# Patient Record
Sex: Female | Born: 1985 | Hispanic: No | Marital: Married | Smoking: Former smoker
Health system: Southern US, Community
[De-identification: ages and names within clinical notes are randomized; demographics above are authoritative.]

## PROBLEM LIST (undated history)

## (undated) ENCOUNTER — Inpatient Hospital Stay: Payer: Self-pay

## (undated) DIAGNOSIS — Q6689 Other  specified congenital deformities of feet: Secondary | ICD-10-CM

## (undated) DIAGNOSIS — Z973 Presence of spectacles and contact lenses: Secondary | ICD-10-CM

## (undated) DIAGNOSIS — S0300XA Dislocation of jaw, unspecified side, initial encounter: Secondary | ICD-10-CM

## (undated) DIAGNOSIS — F319 Bipolar disorder, unspecified: Secondary | ICD-10-CM

## (undated) DIAGNOSIS — Z8632 Personal history of gestational diabetes: Secondary | ICD-10-CM

## (undated) DIAGNOSIS — O24419 Gestational diabetes mellitus in pregnancy, unspecified control: Secondary | ICD-10-CM

## (undated) DIAGNOSIS — R519 Headache, unspecified: Secondary | ICD-10-CM

## (undated) DIAGNOSIS — R51 Headache: Secondary | ICD-10-CM

## (undated) DIAGNOSIS — K219 Gastro-esophageal reflux disease without esophagitis: Secondary | ICD-10-CM

## (undated) DIAGNOSIS — F419 Anxiety disorder, unspecified: Secondary | ICD-10-CM

## (undated) DIAGNOSIS — F32A Depression, unspecified: Secondary | ICD-10-CM

## (undated) DIAGNOSIS — J45909 Unspecified asthma, uncomplicated: Secondary | ICD-10-CM

## (undated) DIAGNOSIS — F329 Major depressive disorder, single episode, unspecified: Secondary | ICD-10-CM

## (undated) DIAGNOSIS — G43909 Migraine, unspecified, not intractable, without status migrainosus: Secondary | ICD-10-CM

## (undated) DIAGNOSIS — E669 Obesity, unspecified: Secondary | ICD-10-CM

## (undated) DIAGNOSIS — N390 Urinary tract infection, site not specified: Secondary | ICD-10-CM

## (undated) DIAGNOSIS — E785 Hyperlipidemia, unspecified: Secondary | ICD-10-CM

## (undated) HISTORY — DX: Gestational diabetes mellitus in pregnancy, unspecified control: O24.419

## (undated) HISTORY — DX: Dislocation of jaw, unspecified side, initial encounter: S03.00XA

## (undated) HISTORY — DX: Bipolar disorder, unspecified: F31.9

## (undated) HISTORY — DX: Headache: R51

## (undated) HISTORY — PX: TUBAL LIGATION: SHX77

## (undated) HISTORY — DX: Headache, unspecified: R51.9

## (undated) HISTORY — DX: Anxiety disorder, unspecified: F41.9

## (undated) HISTORY — DX: Migraine, unspecified, not intractable, without status migrainosus: G43.909

## (undated) HISTORY — DX: Hyperlipidemia, unspecified: E78.5

## (undated) HISTORY — PX: WISDOM TOOTH EXTRACTION: SHX21

## (undated) HISTORY — DX: Major depressive disorder, single episode, unspecified: F32.9

## (undated) HISTORY — DX: Depression, unspecified: F32.A

## (undated) HISTORY — DX: Unspecified asthma, uncomplicated: J45.909

## (undated) HISTORY — DX: Obesity, unspecified: E66.9

## (undated) HISTORY — DX: Urinary tract infection, site not specified: N39.0

## (undated) HISTORY — DX: Personal history of gestational diabetes: Z86.32

---

## 2012-08-28 ENCOUNTER — Emergency Department: Payer: Self-pay | Admitting: Emergency Medicine

## 2012-08-28 LAB — BASIC METABOLIC PANEL
Anion Gap: 6 — ABNORMAL LOW (ref 7–16)
BUN: 12 mg/dL (ref 7–18)
Chloride: 106 mmol/L (ref 98–107)
Creatinine: 0.61 mg/dL (ref 0.60–1.30)
Glucose: 85 mg/dL (ref 65–99)
Sodium: 138 mmol/L (ref 136–145)

## 2013-03-27 DIAGNOSIS — Z01419 Encounter for gynecological examination (general) (routine) without abnormal findings: Secondary | ICD-10-CM | POA: Insufficient documentation

## 2014-01-29 ENCOUNTER — Ambulatory Visit: Payer: Self-pay | Admitting: Obstetrics and Gynecology

## 2014-02-14 ENCOUNTER — Ambulatory Visit: Payer: Self-pay | Admitting: Obstetrics and Gynecology

## 2014-03-17 ENCOUNTER — Ambulatory Visit: Payer: Self-pay | Admitting: Obstetrics and Gynecology

## 2014-04-12 ENCOUNTER — Observation Stay: Payer: Self-pay | Admitting: Obstetrics and Gynecology

## 2014-04-16 ENCOUNTER — Ambulatory Visit: Payer: Self-pay | Admitting: Obstetrics and Gynecology

## 2014-04-16 ENCOUNTER — Observation Stay: Payer: Self-pay | Admitting: Obstetrics and Gynecology

## 2014-05-01 ENCOUNTER — Inpatient Hospital Stay: Payer: Self-pay | Admitting: Obstetrics and Gynecology

## 2014-05-01 ENCOUNTER — Encounter: Payer: Self-pay | Admitting: Maternal & Fetal Medicine

## 2014-05-01 LAB — CBC WITH DIFFERENTIAL/PLATELET
BASOS PCT: 0.8 %
Basophil #: 0.1 10*3/uL (ref 0.0–0.1)
Eosinophil #: 0.2 10*3/uL (ref 0.0–0.7)
Eosinophil %: 1.1 %
HCT: 36.8 % (ref 35.0–47.0)
HGB: 11.9 g/dL — AB (ref 12.0–16.0)
LYMPHS PCT: 16.4 %
Lymphocyte #: 2.3 10*3/uL (ref 1.0–3.6)
MCH: 28 pg (ref 26.0–34.0)
MCHC: 32.2 g/dL (ref 32.0–36.0)
MCV: 87 fL (ref 80–100)
MONO ABS: 0.8 x10 3/mm (ref 0.2–0.9)
Monocyte %: 5.9 %
Neutrophil #: 10.6 10*3/uL — ABNORMAL HIGH (ref 1.4–6.5)
Neutrophil %: 75.8 %
Platelet: 300 10*3/uL (ref 150–440)
RBC: 4.24 10*6/uL (ref 3.80–5.20)
RDW: 15.9 % — ABNORMAL HIGH (ref 11.5–14.5)
WBC: 14 10*3/uL — ABNORMAL HIGH (ref 3.6–11.0)

## 2014-05-02 DIAGNOSIS — O2600 Excessive weight gain in pregnancy, unspecified trimester: Secondary | ICD-10-CM

## 2014-05-02 DIAGNOSIS — O24424 Gestational diabetes mellitus in childbirth, insulin controlled: Secondary | ICD-10-CM

## 2014-05-03 LAB — HEMATOCRIT: HCT: 31.3 % — AB (ref 35.0–47.0)

## 2014-06-05 LAB — PATHOLOGY REPORT

## 2014-09-23 ENCOUNTER — Ambulatory Visit: Payer: Self-pay | Admitting: Family Medicine

## 2014-10-17 DIAGNOSIS — Z8632 Personal history of gestational diabetes: Secondary | ICD-10-CM

## 2014-10-17 HISTORY — DX: Personal history of gestational diabetes: Z86.32

## 2015-02-07 NOTE — Op Note (Signed)
PATIENT NAME:  Andrea Roberson, Andrea Roberson MR#:  [id number] DATE OF BIRTH:  02/24/1986  DATE OF PROCEDURE:  05/02/2014  PREOPERATIVE DIAGNOSES: 1.  Intrauterine pregnancy at 7638 weeks' gestation.  2.  Gestational diabetes, insulin dependent.  3.  Suspected large for gestational age infant.  4.  Failed induction of labor secondary to arrest of dilation.   POSTOPERATIVE DIAGNOSES:  1.  Intrauterine pregnancy at 7338 weeks' gestation.  2.  Gestational diabetes, insulin dependent.  3.  Suspected large for gestational age infant. 4.  Failed induction of labor secondary to arrest of dilation.  5.  Status post low transverse cesarean section.   PROCEDURE: Primary low transverse cesarean section.   SURGEON: Hildred LaserAnika Deasiah Hagberg, MD   ANESTHESIA: Spinal combined with epidural.  ESTIMATED BLOOD LOSS: 400 mL.   URINE OUTPUT: 300 mL.  IV FLUIDS: 1000 mL.   FINDINGS: Female infant, cephalic, weight of 7 pounds 8 ounces or 3400 grams. Apgars were 8 and 9. Clear amniotic fluid at ruptured membranes. Normal-appearing placenta with 3-vessel cord. Uterus, tubes and ovaries appeared normal.   DESCRIPTION OF PROCEDURE: The patient was taken to the operating room where she was placed in the supine position with left lateral tilt. She was then prepped and draped in the usual sterile manner. She received 2 grams of Ancef. A Foley catheter was noted to be in place and drained to gravity. Epidural was tested and found to be adequate. The Pfannenstiel incision was made and carried down through the subcutaneous tissue to the fascia. The fascia was incised in the midline, and the incision was extended laterally using Bovie. The fascia was then separated from the underlying rectus tissue superiorly and inferiorly using sharp and blunt dissection. The peritoneum was then identified and entered. Peritoneal incision was extended longitudinally. Bladder blade was inserted.  The uterovesical peritoneal reflection was incised transversely and  the bladder flap was bluntly freed from the lower uterine segment. A low transverse uterine incision was made along the lower uterine segment with a scalpel. The uterine incision was then extended laterally and superiorly bluntly. The bladder blade was removed and the infant's head was delivered atraumatically. No nuchal cord was noted. The nose and mouth were suctioned. The infant was delivered from vertex presentation, a 3400 gram female with Apgars of 8 and 9. Infant was handed off to the pediatricians in attendance. After the umbilical cord was clamped and cut, cord blood was obtained for evaluation. The placenta was removed manually and intact, and appeared normal. The uterus was exteriorized and cleared of all clots and debris. The uterine outline, tubes and ovaries appeared normal. Uterine incision was then closed with running locked sutures of 0 Vicryl. A second running suture of 0 Vicryl was performed in an imbricating fashion. Hemostasis was observed. The paracolic gutters were cleared of all clots and debris using a moist laparotomy sponge. The peritoneum was then approximated using a suture of 3-0 Vicryl in a running fashion. The fascia was then closed in a running fashion using 1-0 Vicryl. Subcutaneous fat layer was closed using a running suture of 2-0 Vicryl. The skin was closed using a subcuticular stitch of 4-0 Vicryl. The incision was covered with Dermabond. A Lidoderm patch was then placed above and below the incision line. The patient tolerated the procedure well. She was taken to the PACU in stable condition. All sponge, lap, and instrument counts were correct x2 at the end of the procedure.  ____________________________ Jacques EarthlyAnika S. Valentino Saxonherry, MD asc:sb D: 05/02/2014 02:07:00 ET  T: 05/02/2014 07:18:55 ET JOB#: [id number]  cc: Jacques Earthly. Valentino Saxon, MD, <Dictator> Fabian November MD ELECTRONICALLY SIGNED 05/12/2014 10:14

## 2015-02-24 NOTE — H&P (Signed)
L&D Evaluation:  History:  HPI 29 y.o. G1P0 at 38.0 weeks who presents for scheduled induction secondary to GDM (insulin dependent) and LGA infant.  C/o contractions, mild-moderate in severity, ongoing for weeks.  Denies vaginal bleeding, LOF, or decreased fetal movement.   Patient's Medical History GDM (insulin dependent), obesity, h/o labile BPs this pregnancy   Patient's Surgical History none   Medications Pre Natal Vitamins  Insulin NPH and Regular   Allergies NKDA   Social History none   Family History Non-Contributory   ROS:  ROS All systems were reviewed.  HEENT, CNS, GI, GU, Respiratory, CV, Renal and Musculoskeletal systems were found to be normal.   Exam:  Vital Signs BP >140/90  140/72   Urine Protein not completed   General no apparent distress   Mental Status clear   Chest clear   Heart normal sinus rhythm, no murmur/gallop/rubs   Abdomen gravid, non-tender   Estimated Fetal Weight Large for gestational age, ~ 8.5 oz (was in 92% 2 weeks  ago on growth scan, decreased from 98% 5 weeks ago)   Fetal Position cephalic   Fundal Height 40   Back no CVAT   Edema 1+  Nonpitting  ankle   Pelvic no external lesions, 3/75/c/-2   Mebranes Intact   FHT normal rate with no decels   Fetal Heart Rate 130   Ucx irregular   Ucx Frequency 8 min   Length of each Contraction 30 seconds   Skin dry   Other O+/-/HIV-/ND/NR/RI/VZI/GC-/Cl-GBS-.  H/H/platelets 11.9/36.8/300   Impression:  Impression early labor, GDM with LGA infant   Plan:  Plan monitor contractions and for cervical change, monitor BP, fluids   Comments Admit to Labor and Delivery. NPO except ice.  Accuchecks q 2 hrs.  If blood sugars less than 60, will administer D5LR.  If above 200, will initiate diabetic protocol of insulin.  If subsequent BPs elevated, [facility].  Pitocin for augmentation of labor.   Electronic Signatures: Fabian Novemberherry, Joniah Bednarski S (MD)  (Signed 16-Jul-15  14:04)  Authored: L&D Evaluation   Last Updated: 16-Jul-15 14:04 by Fabian Novemberherry, Farrell Broerman S (MD)

## 2015-12-08 ENCOUNTER — Telehealth: Payer: Self-pay | Admitting: Family Medicine

## 2015-12-08 DIAGNOSIS — F419 Anxiety disorder, unspecified: Secondary | ICD-10-CM | POA: Insufficient documentation

## 2015-12-08 DIAGNOSIS — F3181 Bipolar II disorder: Secondary | ICD-10-CM

## 2015-12-08 DIAGNOSIS — F329 Major depressive disorder, single episode, unspecified: Secondary | ICD-10-CM | POA: Insufficient documentation

## 2015-12-08 NOTE — Telephone Encounter (Signed)
Dr.Johnson,  This patient was last seen 01/13/15. She has a history of bipolar disorder. Can this referral be put in without patient being seen?

## 2015-12-08 NOTE — Telephone Encounter (Signed)
Christan: Please get patient scheduled for a CPE, last one was 11/20/14, thanks

## 2015-12-08 NOTE — Telephone Encounter (Signed)
That's fine. Order put in. But she is due for her physical, so if we call her, can we get that scheduled? It was 11/20/14

## 2015-12-08 NOTE — Telephone Encounter (Signed)
Pt would like to get a referral to psychiatry. (pt is already established with [facility] but its been about 2 years since last visit)

## 2015-12-09 ENCOUNTER — Telehealth: Payer: Self-pay

## 2015-12-09 NOTE — Telephone Encounter (Signed)
Nedra Hai from [facility] called stated they need more information on this patient. They need office (2 most recent) stating why this pt needs to be seen by them. Thanks.

## 2015-12-15 NOTE — Telephone Encounter (Signed)
Office Notes, ICD10, and Demographics faxed over to [facility].

## 2015-12-22 ENCOUNTER — Ambulatory Visit (INDEPENDENT_AMBULATORY_CARE_PROVIDER_SITE_OTHER): Payer: 59 | Admitting: Obstetrics and Gynecology

## 2015-12-22 ENCOUNTER — Encounter: Payer: Self-pay | Admitting: Obstetrics and Gynecology

## 2015-12-22 VITALS — BP 98/67 | HR 77 | Ht 65.0 in | Wt 213.6 lb

## 2015-12-22 DIAGNOSIS — E669 Obesity, unspecified: Secondary | ICD-10-CM | POA: Diagnosis not present

## 2015-12-22 DIAGNOSIS — Z01419 Encounter for gynecological examination (general) (routine) without abnormal findings: Secondary | ICD-10-CM

## 2015-12-22 DIAGNOSIS — Z30011 Encounter for initial prescription of contraceptive pills: Secondary | ICD-10-CM

## 2015-12-22 MED ORDER — NORETHINDRONE 0.35 MG PO TABS: 1.0000 | ORAL_TABLET | Freq: Every day | ORAL | Status: DC

## 2015-12-22 NOTE — Patient Instructions (Signed)
Health Maintenance, Female Adopting a healthy lifestyle and getting preventive care can go a long way to promote health and wellness. Talk with your health care provider about what schedule of regular examinations is right for you. This is a good chance for you to check in with your provider about disease prevention and staying healthy. In between checkups, there are plenty of things you can do on your own. Experts have done a lot of research about which lifestyle changes and preventive measures are most likely to keep you healthy. Ask your health care provider for more information. WEIGHT AND DIET  Eat a healthy diet  Be sure to include plenty of vegetables, fruits, low-fat dairy products, and lean protein.  Do not eat a lot of foods high in solid fats, added sugars, or salt.  Get regular exercise. This is one of the most important things you can do for your health.  Most adults should exercise for at least 150 minutes each week. The exercise should increase your heart rate and make you sweat (moderate-intensity exercise).  Most adults should also do strengthening exercises at least twice a week. This is in addition to the moderate-intensity exercise.  Maintain a healthy weight  Body mass index (BMI) is a measurement that can be used to identify possible weight problems. It estimates body fat based on height and weight. Your health care provider can help determine your BMI and help you achieve or maintain a healthy weight.  For females 20 years of age and older:   A BMI below 18.5 is considered underweight.  A BMI of 18.5 to 24.9 is normal.  A BMI of 25 to 29.9 is considered overweight.  A BMI of 30 and above is considered obese.  Watch levels of cholesterol and blood lipids  You should start having your blood tested for lipids and cholesterol at 30 years of age, then have this test every 5 years.  You may need to have your cholesterol levels checked more often if:  Your lipid  or cholesterol levels are high.  You are older than 30 years of age.  You are at high risk for heart disease.  CANCER SCREENING   Lung Cancer  Lung cancer screening is recommended for adults 55-80 years old who are at high risk for lung cancer because of a history of smoking.  A yearly low-dose CT scan of the lungs is recommended for people who:  Currently smoke.  Have quit within the past 15 years.  Have at least a 30-pack-year history of smoking. A pack year is smoking an average of one pack of cigarettes a day for 1 year.  Yearly screening should continue until it has been 15 years since you quit.  Yearly screening should stop if you develop a health problem that would prevent you from having lung cancer treatment.  Breast Cancer  Practice breast self-awareness. This means understanding how your breasts normally appear and feel.  It also means doing regular breast self-exams. Let your health care provider know about any changes, no matter how small.  If you are in your 20s or 30s, you should have a clinical breast exam (CBE) by a health care provider every 1-3 years as part of a regular health exam.  If you are 40 or older, have a CBE every year. Also consider having a breast X-ray (mammogram) every year.  If you have a family history of breast cancer, talk to your health care provider about genetic screening.  If you   are at high risk for breast cancer, talk to your health care provider about having an MRI and a mammogram every year.  Breast cancer gene (BRCA) assessment is recommended for women who have family members with BRCA-related cancers. BRCA-related cancers include:  Breast.  Ovarian.  Tubal.  Peritoneal cancers.  Results of the assessment will determine the need for genetic counseling and BRCA1 and BRCA2 testing. Cervical Cancer Your health care provider may recommend that you be screened regularly for cancer of the pelvic organs (ovaries, uterus, and  vagina). This screening involves a pelvic examination, including checking for microscopic changes to the surface of your cervix (Pap test). You may be encouraged to have this screening done every 3 years, beginning at age 21.  For women ages 30-65, health care providers may recommend pelvic exams and Pap testing every 3 years, or they may recommend the Pap and pelvic exam, combined with testing for human papilloma virus (HPV), every 5 years. Some types of HPV increase your risk of cervical cancer. Testing for HPV may also be done on women of any age with unclear Pap test results.  Other health care providers may not recommend any screening for nonpregnant women who are considered low risk for pelvic cancer and who do not have symptoms. Ask your health care provider if a screening pelvic exam is right for you.  If you have had past treatment for cervical cancer or a condition that could lead to cancer, you need Pap tests and screening for cancer for at least 20 years after your treatment. If Pap tests have been discontinued, your risk factors (such as having a new sexual partner) need to be reassessed to determine if screening should resume. Some women have medical problems that increase the chance of getting cervical cancer. In these cases, your health care provider may recommend more frequent screening and Pap tests. Colorectal Cancer  This type of cancer can be detected and often prevented.  Routine colorectal cancer screening usually begins at 30 years of age and continues through 30 years of age.  Your health care provider may recommend screening at an earlier age if you have risk factors for colon cancer.  Your health care provider may also recommend using home test kits to check for hidden blood in the stool.  A small camera at the end of a tube can be used to examine your colon directly (sigmoidoscopy or colonoscopy). This is done to check for the earliest forms of colorectal  cancer.  Routine screening usually begins at age 50.  Direct examination of the colon should be repeated every 5-10 years through 30 years of age. However, you may need to be screened more often if early forms of precancerous polyps or small growths are found. Skin Cancer  Check your skin from head to toe regularly.  Tell your health care provider about any new moles or changes in moles, especially if there is a change in a mole's shape or color.  Also tell your health care provider if you have a mole that is larger than the size of a pencil eraser.  Always use sunscreen. Apply sunscreen liberally and repeatedly throughout the day.  Protect yourself by wearing long sleeves, pants, a wide-brimmed hat, and sunglasses whenever you are outside. HEART DISEASE, DIABETES, AND HIGH BLOOD PRESSURE   High blood pressure causes heart disease and increases the risk of stroke. High blood pressure is more likely to develop in:  People who have blood pressure in the high end   of the normal range (130-139/85-89 mm Hg).  People who are overweight or obese.  People who are African American.  If you are 38-23 years of age, have your blood pressure checked every 3-5 years. If you are 61 years of age or older, have your blood pressure checked every year. You should have your blood pressure measured twice--once when you are at a hospital or clinic, and once when you are not at a hospital or clinic. Record the average of the two measurements. To check your blood pressure when you are not at a hospital or clinic, you can use:  An automated blood pressure machine at a pharmacy.  A home blood pressure monitor.  If you are between 45 years and 39 years old, ask your health care provider if you should take aspirin to prevent strokes.  Have regular diabetes screenings. This involves taking a blood sample to check your fasting blood sugar level.  If you are at a normal weight and have a low risk for diabetes,  have this test once every three years after 30 years of age.  If you are overweight and have a high risk for diabetes, consider being tested at a younger age or more often. PREVENTING INFECTION  Hepatitis B  If you have a higher risk for hepatitis B, you should be screened for this virus. You are considered at high risk for hepatitis B if:  You were born in a country where hepatitis B is common. Ask your health care provider which countries are considered high risk.  Your parents were born in a high-risk country, and you have not been immunized against hepatitis B (hepatitis B vaccine).  You have HIV or AIDS.  You use needles to inject street drugs.  You live with someone who has hepatitis B.  You have had sex with someone who has hepatitis B.  You get hemodialysis treatment.  You take certain medicines for conditions, including cancer, organ transplantation, and autoimmune conditions. Hepatitis C  Blood testing is recommended for:  Everyone born from [birth year] through 1965.  Anyone with known risk factors for hepatitis C. Sexually transmitted infections (STIs)  You should be screened for sexually transmitted infections (STIs) including gonorrhea and chlamydia if:  You are sexually active and are younger than 30 years of age.  You are older than 30 years of age and your health care provider tells you that you are at risk for this type of infection.  Your sexual activity has changed since you were last screened and you are at an increased risk for chlamydia or gonorrhea. Ask your health care provider if you are at risk.  If you do not have HIV, but are at risk, it may be recommended that you take a prescription medicine daily to prevent HIV infection. This is called pre-exposure prophylaxis (PrEP). You are considered at risk if:  You are sexually active and do not regularly use condoms or know the HIV status of your partner(s).  You take drugs by injection.  You are sexually  active with a partner who has HIV. Talk with your health care provider about whether you are at high risk of being infected with HIV. If you choose to begin PrEP, you should first be tested for HIV. You should then be tested every 3 months for as long as you are taking PrEP.  PREGNANCY   If you are premenopausal and you may become pregnant, ask your health care provider about preconception counseling.  If you may  become pregnant, take 400 to 800 micrograms (mcg) of folic acid every day.  If you want to prevent pregnancy, talk to your health care provider about birth control (contraception). OSTEOPOROSIS AND MENOPAUSE   Osteoporosis is a disease in which the bones lose minerals and strength with aging. This can result in serious bone fractures. Your risk for osteoporosis can be identified using a bone density scan.  If you are 82 years of age or older, or if you are at risk for osteoporosis and fractures, ask your health care provider if you should be screened.  Ask your health care provider whether you should take a calcium or vitamin D supplement to lower your risk for osteoporosis.  Menopause may have certain physical symptoms and risks.  Hormone replacement therapy may reduce some of these symptoms and risks. Talk to your health care provider about whether hormone replacement therapy is right for you.  HOME CARE INSTRUCTIONS   Schedule regular health, dental, and eye exams.  Stay current with your immunizations.   Do not use any tobacco products including cigarettes, chewing tobacco, or electronic cigarettes.  If you are pregnant, do not drink alcohol.  If you are breastfeeding, limit how much and how often you drink alcohol.  Limit alcohol intake to no more than 1 drink per day for nonpregnant women. One drink equals 12 ounces of beer, 5 ounces of wine, or 1 ounces of hard liquor.  Do not use street drugs.  Do not share needles.  Ask your health care provider for help if  you need support or information about quitting drugs.  Tell your health care provider if you often feel depressed.  Tell your health care provider if you have ever been abused or do not feel safe at home.   This information is not intended to replace advice given to you by your health care provider. Make sure you discuss any questions you have with your health care provider.   Document Released: 04/18/2011 Document Revised: 10/24/2014 Document Reviewed: 09/04/2013 Elsevier Interactive Patient Education [employer].       Contraception Choices Contraception (birth control) is the use of any methods or devices to prevent pregnancy. Below are some methods to help avoid pregnancy. HORMONAL METHODS   Contraceptive implant. This is a thin, plastic tube containing progesterone hormone. It does not contain estrogen hormone. Your health care provider inserts the tube in the inner part of the upper arm. The tube can remain in place for up to 3 years. After 3 years, the implant must be removed. The implant prevents the ovaries from releasing an egg (ovulation), thickens the cervical mucus to prevent sperm from entering the uterus, and thins the lining of the inside of the uterus.  Progesterone-only injections. These injections are given every 3 months by your health care provider to prevent pregnancy. This synthetic progesterone hormone stops the ovaries from releasing eggs. It also thickens cervical mucus and changes the uterine lining. This makes it harder for sperm to survive in the uterus.  Birth control pills. These pills contain estrogen and progesterone hormone. They work by preventing the ovaries from releasing eggs (ovulation). They also cause the cervical mucus to thicken, preventing the sperm from entering the uterus. Birth control pills are prescribed by a health care provider.Birth control pills can also be used to treat heavy periods.  Minipill. This type of birth control pill  contains only the progesterone hormone. They are taken every day of each month and must be prescribed  by your health care provider.  Birth control patch. The patch contains hormones similar to those in birth control pills. It must be changed once a week and is prescribed by a health care provider.  Vaginal ring. The ring contains hormones similar to those in birth control pills. It is left in the vagina for 3 weeks, removed for 1 week, and then a new one is put back in place. The patient must be comfortable inserting and removing the ring from the vagina.A health care provider's prescription is necessary.  Emergency contraception. Emergency contraceptives prevent pregnancy after unprotected sexual intercourse. This pill can be taken right after sex or up to 5 days after unprotected sex. It is most effective the sooner you take the pills after having sexual intercourse. Most emergency contraceptive pills are available without a prescription. Check with your pharmacist. Do not use emergency contraception as your only form of birth control. BARRIER METHODS   Female condom. This is a thin sheath (latex or rubber) that is worn over the penis during sexual intercourse. It can be used with spermicide to increase effectiveness.  Female condom. This is a soft, loose-fitting sheath that is put into the vagina before sexual intercourse.  Diaphragm. This is a soft, latex, dome-shaped barrier that must be fitted by a health care provider. It is inserted into the vagina, along with a spermicidal jelly. It is inserted before intercourse. The diaphragm should be left in the vagina for 6 to 8 hours after intercourse.  Cervical cap. This is a round, soft, latex or plastic cup that fits over the cervix and must be fitted by a health care provider. The cap can be left in place for up to 48 hours after intercourse.  Sponge. This is a soft, circular piece of polyurethane foam. The sponge has spermicide in it. It is  inserted into the vagina after wetting it and before sexual intercourse.  Spermicides. These are chemicals that kill or block sperm from entering the cervix and uterus. They come in the form of creams, jellies, suppositories, foam, or tablets. They do not require a prescription. They are inserted into the vagina with an applicator before having sexual intercourse. The process must be repeated every time you have sexual intercourse. INTRAUTERINE CONTRACEPTION  Intrauterine device (IUD). This is a T-shaped device that is put in a woman's uterus during a menstrual period to prevent pregnancy. There are 2 types:  Copper IUD. This type of IUD is wrapped in copper wire and is placed inside the uterus. Copper makes the uterus and fallopian tubes produce a fluid that kills sperm. It can stay in place for 10 years.  Hormone IUD. This type of IUD contains the hormone progestin (synthetic progesterone). The hormone thickens the cervical mucus and prevents sperm from entering the uterus, and it also thins the uterine lining to prevent implantation of a fertilized egg. The hormone can weaken or kill the sperm that get into the uterus. It can stay in place for 3-5 years, depending on which type of IUD is used. PERMANENT METHODS OF CONTRACEPTION  Female tubal ligation. This is when the woman's fallopian tubes are surgically sealed, tied, or blocked to prevent the egg from traveling to the uterus.  Hysteroscopic sterilization. This involves placing a small coil or insert into each fallopian tube. Your doctor uses a technique called hysteroscopy to do the procedure. The device causes scar tissue to form. This results in permanent blockage of the fallopian tubes, so the sperm cannot fertilize the  egg. It takes about 3 months after the procedure for the tubes to become blocked. You must use another form of birth control for these 3 months.  Female sterilization. This is when the female has the tubes that carry sperm tied  off (vasectomy).This blocks sperm from entering the vagina during sexual intercourse. After the procedure, the man can still ejaculate fluid (semen). NATURAL PLANNING METHODS  Natural family planning. This is not having sexual intercourse or using a barrier method (condom, diaphragm, cervical cap) on days the woman could become pregnant.  Calendar method. This is keeping track of the length of each menstrual cycle and identifying when you are fertile.  Ovulation method. This is avoiding sexual intercourse during ovulation.  Symptothermal method. This is avoiding sexual intercourse during ovulation, using a thermometer and ovulation symptoms.  Post-ovulation method. This is timing sexual intercourse after you have ovulated. Regardless of which type or method of contraception you choose, it is important that you use condoms to protect against the transmission of sexually transmitted infections (STIs). Talk with your health care provider about which form of contraception is most appropriate for you.   This information is not intended to replace advice given to you by your health care provider. Make sure you discuss any questions you have with your health care provider.   Document Released: 10/03/2005 Document Revised: 10/08/2013 Document Reviewed: 03/28/2013 Elsevier Interactive Patient Education [employer].

## 2015-12-22 NOTE — Progress Notes (Signed)
GYNECOLOGY ANNUAL PHYSICAL EXAM PROGRESS NOTE  Subjective:    Andrea Roberson is a 30 y.o. P32 married female who presents for an annual exam. The patient has no complaints today. The patient is sexually active.  The patient wears seatbelts: yes. The patient participates in regular exercise: yes. Has the patient ever been transfused or tattooed?: no. The patient reports that there is not domestic violence in her life.    Gynecologic History Patient's last menstrual period was 12/18/2015.  Reports she is still breastfeeding.  Menarche age: 85.  Reports regular menses. Denies dysmenorrhea.  Contraception: condoms currently x 7 months, previously on mini-pill.  History of STI's:  Last Pap: 04/2014. Results were: normal.  Denies h/o abnormal pap smears.    Obstetric History   G1   P1   T1   P0   A0   TAB0   SAB0   E0   M0   L1     # Outcome Date GA Lbr Len/2nd Weight Sex Delivery Anes PTL Lv  1 Term 05/02/14 [redacted]w[redacted]d  7 lb 8 oz (3.402 kg) M CS-LTranv Spinal,EPI N Y     Name: Alan Mulder     Complications: Gestational diabetes mellitus in childbirth, insulin controlled,Excessive weight gain in pregnancy     Apgar1:  8                Apgar5: 9       Past Medical History  Diagnosis Date  . Hyperlipidemia   . Depression   . Anxiety   . Bipolar disorder (HCC)   . Obesity (BMI 35.0-39.9 without comorbidity) (HCC) 12/24/2015    Past Surgical History  Procedure Laterality Date  . Cesarean section    . Wisdom tooth extraction      Family History  Problem Relation Age of Onset  . Heart disease Mother   . Diabetes Mother   . Heart disease Father   . Diabetes Father   . Colon cancer Paternal Aunt   . Heart disease Maternal Grandmother   . Colon cancer Paternal Grandfather     Social History   Social History  . Marital Status: Married    Spouse Name: N/A  . Number of Children: N/A  . Years of Education: N/A   Occupational History  . Not on file.   Social History Main  Topics  . Smoking status: Former Smoker    Quit date: 09/16/2012  . Smokeless tobacco: Not on file  . Alcohol Use: No  . Drug Use: No  . Sexual Activity: Yes    Birth Control/ Protection: None   Other Topics Concern  . Not on file   Social History Narrative  . No narrative on file    No current outpatient prescriptions on file prior to visit.   No current facility-administered medications on file prior to visit.    No Known Allergies   Review of Systems Constitutional: negative for chills, fatigue, fevers and sweats Eyes: negative for irritation, redness and visual disturbance Ears, nose, mouth, throat, and face: negative for hearing loss, nasal congestion, snoring and tinnitus Respiratory: negative for asthma, cough, sputum Cardiovascular: negative for chest pain, dyspnea, exertional chest pressure/discomfort, irregular heart beat, palpitations and syncope Gastrointestinal: negative for abdominal pain, change in bowel habits, nausea and vomiting Genitourinary: negative for abnormal menstrual periods, genital lesions, sexual problems and vaginal discharge, dysuria and urinary incontinence Integument/breast: negative for breast lump, breast tenderness and nipple discharge Hematologic/lymphatic: negative for bleeding and easy bruising  Musculoskeletal:negative for back pain and muscle weakness Neurological: negative for dizziness, headaches, vertigo and weakness Endocrine: negative for diabetic symptoms including polydipsia, polyuria and skin dryness Allergic/Immunologic: negative for hay fever and urticaria       Objective:  Blood pressure 98/67, pulse 77, height 5\' 5"  (1.651 m), weight 213 lb 9.6 oz (96.888 kg), last menstrual period 12/18/2015. Body mass index is 35.54 kg/(m^2).  General Appearance:    Alert, cooperative, no distress, appears stated age; moderately obese  Head:    Normocephalic, without obvious abnormality, atraumatic  Eyes:    PERRL, conjunctiva/corneas  clear, EOM's intact, both eyes  Ears:    Normal external ear canals, both ears  Nose:   Nares normal, septum midline, mucosa normal, no drainage or sinus tenderness  Throat:   Lips, mucosa, and tongue normal; teeth and gums normal  Neck:   Supple, symmetrical, trachea midline, no adenopathy; thyroid: no enlargement/tenderness/nodules; no carotid bruit or JVD  Back:     Symmetric, no curvature, ROM normal, no CVA tenderness  Lungs:     Clear to auscultation bilaterally, respirations unlabored  Chest Wall:    No tenderness or deformity   Heart:    Regular rate and rhythm, S1 and S2 normal, no murmur, rub or gallop  Breast Exam:    No tenderness, masses, or nipple abnormality  Abdomen:     Soft, non-tender, bowel sounds active all four quadrants, no masses, no organomegaly.    Genitalia:    Pelvic:external genitalia normal, vagina without lesions, discharge, or tenderness, rectovaginal septum  normal. Cervix normal in appearance, no cervical motion tenderness, no adnexal masses or tenderness.  Uterus normal size, shape, mobile, regular contours, nontender.  Rectal:    Normal external sphincter.  No hemorrhoids appreciated. Internal exam not done.   Extremities:   Extremities normal, atraumatic, no cyanosis or edema  Pulses:   2+ and symmetric all extremities  Skin:   Skin color, texture, turgor normal, no rashes or lesions  Lymph nodes:   Cervical, supraclavicular, and axillary nodes normal  Neurologic:   CNII-XII intact, normal strength, sensation and reflexes throughout     Assessment:    Healthy female exam.   Obesity Class II  Contraception management  Plan:  Pap smear up to date.  To repeat in 2018.  Breast self exam technique reviewed and patient encouraged to perform self-exam monthly. Labs to be done by PCP (has visit next week).  Contraception: condoms currrently. Desires to resume mini pill.  Prescribed Camilla. To begin on Sunday after next menses.  Discussed healthy lifestyle  modifications.  Notes that she has been exercising and dieting, has lost 8 lbs.  Continued to encourage.  Declines STI testing.  Follow up in 1 year.      Hildred LaserAnika Filimon Miranda, MD [facility]

## 2015-12-24 ENCOUNTER — Encounter: Payer: Self-pay | Admitting: Obstetrics and Gynecology

## 2015-12-24 DIAGNOSIS — E669 Obesity, unspecified: Secondary | ICD-10-CM

## 2015-12-24 HISTORY — DX: Obesity, unspecified: E66.9

## 2015-12-28 ENCOUNTER — Ambulatory Visit (INDEPENDENT_AMBULATORY_CARE_PROVIDER_SITE_OTHER): Payer: 59 | Admitting: Family Medicine

## 2015-12-28 ENCOUNTER — Encounter: Payer: Self-pay | Admitting: Family Medicine

## 2015-12-28 VITALS — BP 113/73 | HR 89 | Temp 98.2°F | Ht 64.2 in | Wt 218.0 lb

## 2015-12-28 DIAGNOSIS — M25511 Pain in right shoulder: Secondary | ICD-10-CM | POA: Insufficient documentation

## 2015-12-28 DIAGNOSIS — Z0000 Encounter for general adult medical examination without abnormal findings: Secondary | ICD-10-CM | POA: Diagnosis not present

## 2015-12-28 LAB — UA/M W/RFLX CULTURE, ROUTINE
BILIRUBIN UA: NEGATIVE
GLUCOSE, UA: NEGATIVE
Ketones, UA: NEGATIVE
LEUKOCYTES UA: NEGATIVE
Nitrite, UA: NEGATIVE
PH UA: 7 (ref 5.0–7.5)
PROTEIN UA: NEGATIVE
RBC UA: NEGATIVE
SPEC GRAV UA: 1.01 (ref 1.005–1.030)
Urobilinogen, Ur: 0.2 mg/dL (ref 0.2–1.0)

## 2015-12-28 NOTE — Assessment & Plan Note (Signed)
Concern for tendonitis. Will get her into PT. Rx given today. Call if not getting better or getting worse.

## 2015-12-28 NOTE — Progress Notes (Signed)
BP 113/73 mmHg  Pulse 89  Temp(Src) 98.2 F (36.8 C)  Ht 5' 4.2" (1.631 m)  Wt 218 lb (98.884 kg)  BMI 37.17 kg/m2  SpO2 97%  LMP 12/18/2015   Subjective:    Patient ID: Andrea Roberson, female    DOB: [birth year], 30 y.o.   MRN: [id number]  HPI: Andrea Roberson is a 30 y.o. female presenting on 12/28/2015 for comprehensive medical examination. Current medical complaints include:  SHOULDER PAIN Duration: year Involved shoulder: right Mechanism of injury: unknown Location: diffuse Onset:gradual Severity: 6/10  Quality:  aching Frequency: intermittent Radiation: no Aggravating factors: lifting, movement, sleep and throwing  Alleviating factors: medicine, rest  Status: stable Treatments attempted: rest, ice, ibuprofen and HEP  Relief with NSAIDs?:  no Weakness: no Numbness: yes Decreased grip strength: no Redness: no Swelling: no Bruising: no Fevers: no  She currently lives with: husband and little one Menopausal Symptoms: no  Depression Screen done today and results listed below:  Depression screen [facility] 2/9 12/28/2015  Decreased Interest 0  Down, Depressed, Hopeless 0  PHQ - 2 Score 0    Past Medical History:  Past Medical History  Diagnosis Date  . Hyperlipidemia   . Depression   . Anxiety   . Bipolar disorder (HCC)   . Obesity (BMI 35.0-39.9 without comorbidity) (HCC) 12/24/2015    Surgical History:  Past Surgical History  Procedure Laterality Date  . Cesarean section    . Wisdom tooth extraction      Medications:  No current outpatient prescriptions on file prior to visit.   No current facility-administered medications on file prior to visit.    Allergies:  No Known Allergies  Social History:  Social History   Social History  . Marital Status: Married    Spouse Name: N/A  . Number of Children: N/A  . Years of Education: N/A   Occupational History  . Not on file.   Social History Main Topics  . Smoking status: Former Smoker    Quit  date: 09/16/2012  . Smokeless tobacco: Never Used  . Alcohol Use: No  . Drug Use: No  . Sexual Activity: Yes    Birth Control/ Protection: None   Other Topics Concern  . Not on file   Social History Narrative   History  Smoking status  . Former Smoker  . Quit date: 09/16/2012  Smokeless tobacco  . Never Used   History  Alcohol Use No    Family History:  Family History  Problem Relation Age of Onset  . Heart disease Mother   . Diabetes Mother   . Heart disease Father   . Diabetes Father   . Colon cancer Paternal Aunt   . Heart disease Maternal Grandmother   . Colon cancer Paternal Grandfather     Past medical history, surgical history, medications, allergies, family history and social history reviewed with patient today and changes made to appropriate areas of the chart.   Review of Systems  Constitutional: Positive for diaphoresis. Negative for fever, chills, weight loss and malaise/fatigue.  HENT: Negative for congestion, ear discharge, ear pain, hearing loss, nosebleeds, sore throat and tinnitus.   Eyes: Negative.   Respiratory: Negative.  Negative for stridor.   Cardiovascular: Negative.   Gastrointestinal: Negative.   Genitourinary: Negative.   Musculoskeletal: Positive for myalgias and joint pain. Negative for back pain, falls and neck pain.  Skin: Negative.   Neurological: Positive for headaches. Negative for dizziness, tingling, tremors, sensory change, speech change, focal  weakness, seizures, loss of consciousness and weakness.  Endo/Heme/Allergies: Positive for environmental allergies. Negative for polydipsia. Does not bruise/bleed easily.  Psychiatric/Behavioral: Negative for depression, suicidal ideas, hallucinations, memory loss and substance abuse. The patient is nervous/anxious. The patient does not have insomnia.     All other ROS negative except what is listed above and in the HPI.      Objective:    BP 113/73 mmHg  Pulse 89  Temp(Src) 98.2  F (36.8 C)  Ht 5' 4.2" (1.631 m)  Wt 218 lb (98.884 kg)  BMI 37.17 kg/m2  SpO2 97%  LMP 12/18/2015  Wt Readings from Last 3 Encounters:  12/28/15 218 lb (98.884 kg)  01/03/15 209 lb (94.802 kg)  12/22/15 213 lb 9.6 oz (96.888 kg)    Physical Exam  Constitutional: She is oriented to person, place, and time. She appears well-developed and well-nourished. No distress.  HENT:  Head: Normocephalic and atraumatic.  Right Ear: Hearing, tympanic membrane, external ear and ear canal normal.  Left Ear: Hearing, tympanic membrane, external ear and ear canal normal.  Nose: Nose normal.  Mouth/Throat: Uvula is midline, oropharynx is clear and moist and mucous membranes are normal. No oropharyngeal exudate.  Eyes: Conjunctivae, EOM and lids are normal. Pupils are equal, round, and reactive to light. Right eye exhibits no discharge. Left eye exhibits no discharge. No scleral icterus.  Neck: Normal range of motion. Neck supple. No JVD present. No tracheal deviation present. No thyromegaly present.  Cardiovascular: Normal rate, regular rhythm, normal heart sounds and intact distal pulses.  Exam reveals no gallop and no friction rub.   No murmur heard. Pulmonary/Chest: Effort normal and breath sounds normal. No stridor. No respiratory distress. She has no wheezes. She has no rales. She exhibits no tenderness.  Abdominal: Soft. Bowel sounds are normal. She exhibits no distension and no mass. There is no tenderness. There is no rebound and no guarding.  Genitourinary:  Deferred, done at GYN  Lymphadenopathy:    She has no cervical adenopathy.  Neurological: She is alert and oriented to person, place, and time. She has normal reflexes. She displays normal reflexes. No cranial nerve deficit. She exhibits normal muscle tone. Coordination normal.  Skin: Skin is warm, dry and intact. No rash noted. She is not diaphoretic. No erythema. No pallor.  Psychiatric: She has a normal mood and affect. Her speech is  normal and behavior is normal. Judgment and thought content normal. Cognition and memory are normal.  Nursing note and vitals reviewed.    Shoulder: right    Inspection:  no swelling, ecchymosis, erythema or step off deformity.    Tenderness to Palpation:    Acromion: no    AC joint:no    Clavicle: yes    Bicipital groove: no    Scapular spine: yes    Coracoid process: no    Humeral head: no    Supraspinatus tendon: yes     Range of Motion:     Abduction:Normal    Adduction: Normal    Flexion: Decreased    Extension: Decreased    Internal rotation: Decreased    External rotation: Decreased    Painful arc: yes     Muscle Strength: 5/5 bilaterally    Neuro: Sensation WNL. and Upper extremity reflexes WNL.     Special Tests:     Neer sign: Negative    Hawkins sign: Positive    Cross arm adduction: Negative    Yergason sign: Negative    O'brien sign:  Negative     Speed sign: Negative       Assessment & Plan:   Problem List Items Addressed This Visit      Other   Shoulder pain, right    Concern for tendonitis. Will get her into PT. Rx given today. Call if not getting better or getting worse.        Other Visit Diagnoses    Routine general medical examination at a health care facility    -  Primary    Screening labs checked today. Pap up to date. Vaccines up to date. Continue diet and exercise. Continue to monitor. Follow up in 1 year.     Relevant Orders    CBC with Differential/Platelet    Comprehensive metabolic panel    Lipid Panel w/o Chol/HDL Ratio    TSH    UA/M w/rflx Culture, Routine        Follow up plan: Return in about 1 year (around 12/27/2016) for PE.   LABORATORY TESTING:  - Pap smear: done elsewhere  IMMUNIZATIONS:   - Tdap: Tetanus vaccination status reviewed: last tetanus booster within 10 years. - Influenza: Up to date - Pneumovax: Refused  PATIENT COUNSELING:   Advised to take 1 mg of folate supplement per day if capable of  pregnancy.   Sexuality: Discussed sexually transmitted diseases, partner selection, use of condoms, avoidance of unintended pregnancy  and contraceptive alternatives.   Advised to avoid cigarette smoking.  I discussed with the patient that most people either abstain from alcohol or drink within safe limits (<=14/week and <=4 drinks/occasion for males, <=7/weeks and <= 3 drinks/occasion for females) and that the risk for alcohol disorders and other health effects rises proportionally with the number of drinks per week and how often a drinker exceeds daily limits.  Discussed cessation/primary prevention of drug use and availability of treatment for abuse.   Diet: Encouraged to adjust caloric intake to maintain  or achieve ideal body weight, to reduce intake of dietary saturated fat and total fat, to limit sodium intake by avoiding high sodium foods and not adding table salt, and to maintain adequate dietary potassium and calcium preferably from fresh fruits, vegetables, and low-fat dairy products.    stressed the importance of regular exercise  Injury prevention: Discussed safety belts, safety helmets, smoke detector, smoking near bedding or upholstery.   Dental health: Discussed importance of regular tooth brushing, flossing, and dental visits.    NEXT PREVENTATIVE PHYSICAL DUE IN 1 YEAR. Return in about 1 year (around 12/27/2016) for PE.

## 2015-12-28 NOTE — Patient Instructions (Addendum)
Health Maintenance, Female Adopting a healthy lifestyle and getting preventive care can go a long way to promote health and wellness. Talk with your health care provider about what schedule of regular examinations is right for you. This is a good chance for you to check in with your provider about disease prevention and staying healthy. In between checkups, there are plenty of things you can do on your own. Experts have done a lot of research about which lifestyle changes and preventive measures are most likely to keep you healthy. Ask your health care provider for more information. WEIGHT AND DIET  Eat a healthy diet  Be sure to include plenty of vegetables, fruits, low-fat dairy products, and lean protein.  Do not eat a lot of foods high in solid fats, added sugars, or salt.  Get regular exercise. This is one of the most important things you can do for your health.  Most adults should exercise for at least 150 minutes each week. The exercise should increase your heart rate and make you sweat (moderate-intensity exercise).  Most adults should also do strengthening exercises at least twice a week. This is in addition to the moderate-intensity exercise.  Maintain a healthy weight  Body mass index (BMI) is a measurement that can be used to identify possible weight problems. It estimates body fat based on height and weight. Your health care provider can help determine your BMI and help you achieve or maintain a healthy weight.  For females 20 years of age and older:   A BMI below 18.5 is considered underweight.  A BMI of 18.5 to 24.9 is normal.  A BMI of 25 to 29.9 is considered overweight.  A BMI of 30 and above is considered obese.  Watch levels of cholesterol and blood lipids  You should start having your blood tested for lipids and cholesterol at 30 years of age, then have this test every 5 years.  You may need to have your cholesterol levels checked more often if:  Your lipid  or cholesterol levels are high.  You are older than 30 years of age.  You are at high risk for heart disease.  CANCER SCREENING   Lung Cancer  Lung cancer screening is recommended for adults 55-80 years old who are at high risk for lung cancer because of a history of smoking.  A yearly low-dose CT scan of the lungs is recommended for people who:  Currently smoke.  Have quit within the past 15 years.  Have at least a 30-pack-year history of smoking. A pack year is smoking an average of one pack of cigarettes a day for 1 year.  Yearly screening should continue until it has been 15 years since you quit.  Yearly screening should stop if you develop a health problem that would prevent you from having lung cancer treatment.  Breast Cancer  Practice breast self-awareness. This means understanding how your breasts normally appear and feel.  It also means doing regular breast self-exams. Let your health care provider know about any changes, no matter how small.  If you are in your 20s or 30s, you should have a clinical breast exam (CBE) by a health care provider every 1-3 years as part of a regular health exam.  If you are 40 or older, have a CBE every year. Also consider having a breast X-ray (mammogram) every year.  If you have a family history of breast cancer, talk to your health care provider about genetic screening.  If you   are at high risk for breast cancer, talk to your health care provider about having an MRI and a mammogram every year.  Breast cancer gene (BRCA) assessment is recommended for women who have family members with BRCA-related cancers. BRCA-related cancers include:  Breast.  Ovarian.  Tubal.  Peritoneal cancers.  Results of the assessment will determine the need for genetic counseling and BRCA1 and BRCA2 testing. Cervical Cancer Your health care provider may recommend that you be screened regularly for cancer of the pelvic organs (ovaries, uterus, and  vagina). This screening involves a pelvic examination, including checking for microscopic changes to the surface of your cervix (Pap test). You may be encouraged to have this screening done every 3 years, beginning at age 21.  For women ages 30-65, health care providers may recommend pelvic exams and Pap testing every 3 years, or they may recommend the Pap and pelvic exam, combined with testing for human papilloma virus (HPV), every 5 years. Some types of HPV increase your risk of cervical cancer. Testing for HPV may also be done on women of any age with unclear Pap test results.  Other health care providers may not recommend any screening for nonpregnant women who are considered low risk for pelvic cancer and who do not have symptoms. Ask your health care provider if a screening pelvic exam is right for you.  If you have had past treatment for cervical cancer or a condition that could lead to cancer, you need Pap tests and screening for cancer for at least 20 years after your treatment. If Pap tests have been discontinued, your risk factors (such as having a new sexual partner) need to be reassessed to determine if screening should resume. Some women have medical problems that increase the chance of getting cervical cancer. In these cases, your health care provider may recommend more frequent screening and Pap tests. Colorectal Cancer  This type of cancer can be detected and often prevented.  Routine colorectal cancer screening usually begins at 30 years of age and continues through 30 years of age.  Your health care provider may recommend screening at an earlier age if you have risk factors for colon cancer.  Your health care provider may also recommend using home test kits to check for hidden blood in the stool.  A small camera at the end of a tube can be used to examine your colon directly (sigmoidoscopy or colonoscopy). This is done to check for the earliest forms of colorectal  cancer.  Routine screening usually begins at age 50.  Direct examination of the colon should be repeated every 5-10 years through 30 years of age. However, you may need to be screened more often if early forms of precancerous polyps or small growths are found. Skin Cancer  Check your skin from head to toe regularly.  Tell your health care provider about any new moles or changes in moles, especially if there is a change in a mole's shape or color.  Also tell your health care provider if you have a mole that is larger than the size of a pencil eraser.  Always use sunscreen. Apply sunscreen liberally and repeatedly throughout the day.  Protect yourself by wearing long sleeves, pants, a wide-brimmed hat, and sunglasses whenever you are outside. HEART DISEASE, DIABETES, AND HIGH BLOOD PRESSURE   High blood pressure causes heart disease and increases the risk of stroke. High blood pressure is more likely to develop in:  People who have blood pressure in the high end   of the normal range (130-139/85-89 mm Hg).  People who are overweight or obese.  People who are African American.  If you are 38-23 years of age, have your blood pressure checked every 3-5 years. If you are 61 years of age or older, have your blood pressure checked every year. You should have your blood pressure measured twice--once when you are at a hospital or clinic, and once when you are not at a hospital or clinic. Record the average of the two measurements. To check your blood pressure when you are not at a hospital or clinic, you can use:  An automated blood pressure machine at a pharmacy.  A home blood pressure monitor.  If you are between 45 years and 39 years old, ask your health care provider if you should take aspirin to prevent strokes.  Have regular diabetes screenings. This involves taking a blood sample to check your fasting blood sugar level.  If you are at a normal weight and have a low risk for diabetes,  have this test once every three years after 30 years of age.  If you are overweight and have a high risk for diabetes, consider being tested at a younger age or more often. PREVENTING INFECTION  Hepatitis B  If you have a higher risk for hepatitis B, you should be screened for this virus. You are considered at high risk for hepatitis B if:  You were born in a country where hepatitis B is common. Ask your health care provider which countries are considered high risk.  Your parents were born in a high-risk country, and you have not been immunized against hepatitis B (hepatitis B vaccine).  You have HIV or AIDS.  You use needles to inject street drugs.  You live with someone who has hepatitis B.  You have had sex with someone who has hepatitis B.  You get hemodialysis treatment.  You take certain medicines for conditions, including cancer, organ transplantation, and autoimmune conditions. Hepatitis C  Blood testing is recommended for:  Everyone born from [birth year] through 1965.  Anyone with known risk factors for hepatitis C. Sexually transmitted infections (STIs)  You should be screened for sexually transmitted infections (STIs) including gonorrhea and chlamydia if:  You are sexually active and are younger than 30 years of age.  You are older than 30 years of age and your health care provider tells you that you are at risk for this type of infection.  Your sexual activity has changed since you were last screened and you are at an increased risk for chlamydia or gonorrhea. Ask your health care provider if you are at risk.  If you do not have HIV, but are at risk, it may be recommended that you take a prescription medicine daily to prevent HIV infection. This is called pre-exposure prophylaxis (PrEP). You are considered at risk if:  You are sexually active and do not regularly use condoms or know the HIV status of your partner(s).  You take drugs by injection.  You are sexually  active with a partner who has HIV. Talk with your health care provider about whether you are at high risk of being infected with HIV. If you choose to begin PrEP, you should first be tested for HIV. You should then be tested every 3 months for as long as you are taking PrEP.  PREGNANCY   If you are premenopausal and you may become pregnant, ask your health care provider about preconception counseling.  If you may  become pregnant, take 400 to 800 micrograms (mcg) of folic acid every day.  If you want to prevent pregnancy, talk to your health care provider about birth control (contraception). OSTEOPOROSIS AND MENOPAUSE   Osteoporosis is a disease in which the bones lose minerals and strength with aging. This can result in serious bone fractures. Your risk for osteoporosis can be identified using a bone density scan.  If you are 19 years of age or older, or if you are at risk for osteoporosis and fractures, ask your health care provider if you should be screened.  Ask your health care provider whether you should take a calcium or vitamin D supplement to lower your risk for osteoporosis.  Menopause may have certain physical symptoms and risks.  Hormone replacement therapy may reduce some of these symptoms and risks. Talk to your health care provider about whether hormone replacement therapy is right for you.  HOME CARE INSTRUCTIONS   Schedule regular health, dental, and eye exams.  Stay current with your immunizations.   Do not use any tobacco products including cigarettes, chewing tobacco, or electronic cigarettes.  If you are pregnant, do not drink alcohol.  If you are breastfeeding, limit how much and how often you drink alcohol.  Limit alcohol intake to no more than 1 drink per day for nonpregnant women. One drink equals 12 ounces of beer, 5 ounces of wine, or 1 ounces of hard liquor.  Do not use street drugs.  Do not share needles.  Ask your health care provider for help if  you need support or information about quitting drugs.  Tell your health care provider if you often feel depressed.  Tell your health care provider if you have ever been abused or do not feel safe at home.   This information is not intended to replace advice given to you by your health care provider. Make sure you discuss any questions you have with your health care provider.   Document Released: 04/18/2011 Document Revised: 10/24/2014 Document Reviewed: 09/04/2013 Elsevier Interactive Patient Education 2016 [place] Cuff Tendinitis Rotator cuff tendinitis is inflammation of the tough, cord-like bands that connect muscle to bone (tendons) in your rotator cuff. Your rotator cuff is the collection of all the muscles and tendons that connect your arm to your shoulder. Your rotator cuff holds the head of your upper arm bone (humerus) in the cup (fossa) of your shoulder blade (scapula). CAUSES Rotator cuff tendinitis is usually caused by overusing the joint involved.  SIGNS AND SYMPTOMS  Deep ache in the shoulder also felt on the outside upper arm over the shoulder muscle.  Point tenderness over the area that is injured.  Pain comes on gradually and becomes worse with lifting the arm to the side (abduction) or turning it inward (internal rotation).  May lead to a chronic tear: When a rotator cuff tendon becomes inflamed, it runs the risk of losing its blood supply, causing some tendon fibers to die. This increases the risk that the tendon can fray and partially or completely tear. DIAGNOSIS Rotator cuff tendinitis is diagnosed by taking a medical history, performing a physical exam, and reviewing results of imaging exams. The medical history is useful to help determine the type of rotator cuff injury. The physical exam will include looking at the injured shoulder, feeling the injured area, and watching you do range-of-motion exercises. X-ray exams are typically done to rule out other  causes of shoulder pain, such as fractures. MRI is the imaging exam  usually used for significant shoulder injuries. Sometimes a dye study called CT arthrogram is done, but it is not as widely used as MRI. In some institutions, special ultrasound tests may also be used to aid in the diagnosis. TREATMENT  Less Severe Cases  Use of a sling to rest the shoulder for a short period of time. Prolonged use of the sling can cause stiffness, weakness, and loss of motion of the shoulder joint.  Anti-inflammatory medicines, such as ibuprofen or naproxen sodium, may be prescribed. More Severe Cases  Physical therapy.  Use of steroid injections into the shoulder joint.  Surgery. HOME CARE INSTRUCTIONS   Use a sling or splint until the pain decreases. Prolonged use of the sling can cause stiffness, weakness, and loss of motion of the shoulder joint.  Apply ice to the injured area:  Put ice in a plastic bag.  Place a towel between your skin and the bag.  Leave the ice on for 20 minutes, 2-3 times a day.  Try to avoid use other than gentle range of motion while your shoulder is painful. Use the shoulder and exercise only as directed by your health care provider. Stop exercises or range of motion if pain or discomfort increases, unless directed otherwise by your health care provider.  Only take over-the-counter or prescription medicines for pain, discomfort, or fever as directed by your health care provider.  If you were given a shoulder sling and straps (immobilizer), do not remove it except as directed, or until you see a health care provider for a follow-up exam. If you need to remove it, move your arm as little as possible or as directed.  You may want to sleep on several pillows at night to lessen swelling and pain. SEEK IMMEDIATE MEDICAL CARE IF:   Your shoulder pain increases or new pain develops in your arm, hand, or fingers and is not relieved with medicines.  You have new, unexplained  symptoms, especially increased numbness in the hands or loss of strength.  You develop any worsening of the problems that brought you in for care.  Your arm, hand, or fingers are numb or tingling.  Your arm, hand, or fingers are swollen, painful, or turn white or blue. MAKE SURE YOU:  Understand these instructions.  Will watch your condition.  Will get help right away if you are not doing well or get worse.   This information is not intended to replace advice given to you by your health care provider. Make sure you discuss any questions you have with your health care provider.   Document Released: 12/24/2003 Document Revised: 10/24/2014 Document Reviewed: 05/15/2013 Elsevier Interactive Patient Education 2016 [facility] [address] [place] # [place], [place] [address] [phone] Fax: [phone]   [facility]:  ADDRESS  [address]  [place], [place] [address]  CONTACT INFORMATION   Phone:[phone]   Fax: [phone]   EMAIL  [email or web address]  OFFICE HOURS  Sun  : CLOSED  Mon  : 7:00 am - 7:00 pm  Tues  : 7:00 am - 7:00 pm  Wed  : 7:00 am - 7:00 pm  Thurs  : 7:00 am - 7:00 pm  Fri  : 7:00 am - 5:00 pm  Sat  : CLOSED

## 2015-12-29 ENCOUNTER — Encounter: Payer: Self-pay | Admitting: Family Medicine

## 2015-12-29 LAB — COMPREHENSIVE METABOLIC PANEL
A/G RATIO: 1.4 (ref 1.2–2.2)
ALK PHOS: 105 IU/L (ref 39–117)
ALT: 18 IU/L (ref 0–32)
AST: 21 IU/L (ref 0–40)
Albumin: 4.1 g/dL (ref 3.5–5.5)
BUN/Creatinine Ratio: 15 (ref 8–20)
BUN: 10 mg/dL (ref 6–20)
CHLORIDE: 101 mmol/L (ref 96–106)
CO2: 21 mmol/L (ref 18–29)
Calcium: 9.2 mg/dL (ref 8.7–10.2)
Creatinine, Ser: 0.67 mg/dL (ref 0.57–1.00)
GFR calc Af Amer: 137 mL/min/{1.73_m2} (ref >59)
GFR calc non Af Amer: 119 mL/min/{1.73_m2} (ref >59)
GLOBULIN, TOTAL: 2.9 g/dL (ref 1.5–4.5)
Glucose: 91 mg/dL (ref 65–99)
POTASSIUM: 4 mmol/L (ref 3.5–5.2)
SODIUM: 139 mmol/L (ref 134–144)
Total Protein: 7 g/dL (ref 6.0–8.5)

## 2015-12-29 LAB — TSH: TSH: 1.18 u[IU]/mL (ref 0.450–4.500)

## 2015-12-29 LAB — LIPID PANEL W/O CHOL/HDL RATIO
CHOLESTEROL TOTAL: 171 mg/dL (ref 100–199)
HDL: 22 mg/dL — ABNORMAL LOW (ref >39)
LDL Calculated: 69 mg/dL (ref 0–99)
TRIGLYCERIDES: 400 mg/dL — AB (ref 0–149)
VLDL Cholesterol Cal: 80 mg/dL — ABNORMAL HIGH (ref 5–40)

## 2015-12-29 LAB — CBC WITH DIFFERENTIAL/PLATELET
Basophils Absolute: 0.1 10*3/uL (ref 0.0–0.2)
Basos: 1 %
EOS (ABSOLUTE): 0.2 10*3/uL (ref 0.0–0.4)
Eos: 2 %
HEMATOCRIT: 36.6 % (ref 34.0–46.6)
Hemoglobin: 12.3 g/dL (ref 11.1–15.9)
Immature Grans (Abs): 0 10*3/uL (ref 0.0–0.1)
Immature Granulocytes: 0 %
LYMPHS ABS: 3.7 10*3/uL — AB (ref 0.7–3.1)
Lymphs: 41 %
MCH: 28 pg (ref 26.6–33.0)
MCHC: 33.6 g/dL (ref 31.5–35.7)
MCV: 83 fL (ref 79–97)
MONOS ABS: 0.5 10*3/uL (ref 0.1–0.9)
Monocytes: 6 %
Neutrophils Absolute: 4.6 10*3/uL (ref 1.4–7.0)
Neutrophils: 50 %
Platelets: 300 10*3/uL (ref 150–379)
RBC: 4.39 x10E6/uL (ref 3.77–5.28)
RDW: 13.9 % (ref 12.3–15.4)
WBC: 9 10*3/uL (ref 3.4–10.8)

## 2016-01-13 ENCOUNTER — Encounter: Payer: Self-pay | Admitting: Psychiatry

## 2016-01-13 ENCOUNTER — Ambulatory Visit (INDEPENDENT_AMBULATORY_CARE_PROVIDER_SITE_OTHER): Payer: 59 | Admitting: Psychiatry

## 2016-01-13 VITALS — BP 102/68 | HR 100 | Temp 97.9°F | Ht 64.2 in | Wt 216.4 lb

## 2016-01-13 DIAGNOSIS — F411 Generalized anxiety disorder: Secondary | ICD-10-CM | POA: Diagnosis not present

## 2016-01-13 MED ORDER — SERTRALINE HCL 25 MG PO TABS: 25.0000 mg | ORAL_TABLET | Freq: Every day | ORAL | Status: DC

## 2016-01-13 NOTE — Progress Notes (Signed)
Psychiatric Initial Adult Assessment   Patient Identification: Andrea Roberson MRN:  [id number] Date of Evaluation:  01/13/2016 Referral Source:  Chief Complaint:   Visit Diagnosis: No diagnosis found.  History of Present Illness:  Patient is a 30 year old African-American female who presents to the clinic for an evaluation of her anxiety and medication management. She reports that more recently she is become very stressed and easily overwhelmed. States that states that she has not been able to organize herself well like she usually does. States that she has been getting easily irritated. She feels helpless and stressed easily. It has gotten worse in the last 2 months. States that she is feeling like she is having a hard time functioning in all areas of life. She's been having mood swings and racing thoughts. She is also endorsing excessive worrying. Endorses obsessive thoughts and poor concentration. Patient is married and has enough-year-old child. States that she is in a good marriage but feels like taking care of her baby and the hose old has become very overwhelming for her. 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 States she likes her job and she is able to manage it well. States he becomes very chaotic at home in the evenings Patient denies any suicidal thoughts. She denies any psychotic symptoms. She does endorse some mild manic symptoms where she reports she shops at least once a month and spend some money. She also reports she has periods when she feels very energetic. She was taking medication until she was got pregnant and stopped taking her medications then for the anxiety.  Associated Signs/Symptoms: Depression Symptoms:  depressed mood, insomnia, fatigue, feelings of worthlessness/guilt, hopelessness, loss of energy/fatigue, disturbed sleep, (Hypo) Manic Symptoms:  denies Anxiety Symptoms:  Excessive Worry, Psychotic Symptoms:   denies PTSD Symptoms: Denies  Past Psychiatric History: No suicide attempts, no previous hospitalizations.  Previous Psychotropic Medications: Yes   Substance Abuse History in the last 12 months:  No.  Consequences of Substance Abuse: Negative  Past Medical History:  Past Medical History  Diagnosis Date  . Hyperlipidemia   . Depression   . Anxiety   . Bipolar disorder (HCC)   . Obesity (BMI 35.0-39.9 without comorbidity) (HCC) 12/24/2015    Past Surgical History  Procedure Laterality Date  . Cesarean section    . Wisdom tooth extraction      Family Psychiatric History: Mother and grandmother have depression, have been hospitalized.  Family History:  Family History  Problem Relation Age of Onset  . Heart disease Mother   . Diabetes Mother   . Heart disease Father   . Diabetes Father   . Colon cancer Paternal Aunt   . Heart disease Maternal Grandmother   . Colon cancer Paternal Grandfather     Social History:   Social History   Social History  . Marital Status: Married    Spouse Name: N/A  . Number of Children: N/A  . Years of Education: N/A   Social History Main Topics  . Smoking status: Former Smoker    Quit date: 09/16/2012  . Smokeless tobacco: Never Used  . Alcohol Use: No  . Drug Use: No  . Sexual Activity: Yes    Birth Control/ Protection: None   Other Topics Concern  . Not on file   Social History Narrative    Additional Social History: Patient is married and has been married for 3 years and is in a good relationship.  Allergies:  No Known Allergies  Metabolic Disorder Labs: No results found for: HGBA1C, MPG No results found for: PROLACTIN Lab Results  Component Value Date   CHOL 171 12/28/2015   TRIG 400* 12/28/2015   HDL 22* 12/28/2015   LDLCALC 69 12/28/2015     Current Medications: No current outpatient prescriptions on file.   No current facility-administered medications for this visit.    Neurologic: Headache: everyday  headaches for past 2 months Seizure: No Paresthesias:No  Musculoskeletal: Strength & Muscle Tone: within normal limits Gait & Station: normal Patient leans: N/A  Psychiatric Specialty Exam: ROS  Last menstrual period 12/18/2015.There is no weight on file to calculate BMI.  General Appearance: Casual  Eye Contact:  Fair  Speech:  Clear and Coherent  Volume:  Normal  Mood:  Anxious, Depressed and Dysphoric  Affect:  Constricted and Depressed  Thought Process:  Coherent  Orientation:  Full (Time, Place, and Person)  Thought Content:  Rumination  Suicidal Thoughts:  No  Homicidal Thoughts:  No  Memory:  Immediate;   Fair Recent;   Fair Remote;   Fair  Judgement:  Fair  Insight:  Fair  Psychomotor Activity:  Normal  Concentration:  Fair  Recall:  [employer] of Knowledge:Fair  Language: Fair  Akathisia:  No  Handed:  Right  AIMS (if indicated):   Assets:  Communication Skills Desire for Improvement  ADL's:  Intact  Cognition: WNL  Sleep:      Treatment Plan Summary: Medication management   Generalized anxiety Disorder Start Zoloft at 25 mg once daily. Patient made aware of the side effects of abdominal upset and headaches. Discussed starting to see a therapist. States that she will look into it.  Rule out bipolar disorder Continue to monitor her mood symptoms.  The clinic in 2 weeks time or call before if necessary.   Patrick North, MD 3/29/20171:05 PM

## 2016-01-27 ENCOUNTER — Encounter: Payer: Self-pay | Admitting: Psychiatry

## 2016-01-27 ENCOUNTER — Ambulatory Visit (INDEPENDENT_AMBULATORY_CARE_PROVIDER_SITE_OTHER): Payer: 59 | Admitting: Psychiatry

## 2016-01-27 VITALS — BP 122/84 | HR 99 | Temp 98.2°F | Ht 64.2 in | Wt 215.0 lb

## 2016-01-27 DIAGNOSIS — F411 Generalized anxiety disorder: Secondary | ICD-10-CM | POA: Insufficient documentation

## 2016-01-27 MED ORDER — HYDROXYZINE PAMOATE 25 MG PO CAPS: 25.0000 mg | ORAL_CAPSULE | Freq: Three times a day (TID) | ORAL | Status: DC | PRN

## 2016-01-27 MED ORDER — FLUOXETINE HCL 10 MG PO CAPS: 10.0000 mg | ORAL_CAPSULE | Freq: Every day | ORAL | Status: DC

## 2016-01-27 NOTE — Progress Notes (Signed)
Patient ID: Andrea Roberson, female   DOB: 04/06/1986, 30 y.o.   MRN: [id number]  Psychiatric Initial Adult Assessment   Patient Identification: Andrea DupreCrystal M Buckwalter MRN:  [id number] Date of Evaluation:  01/27/2016 Referral Source:  Chief Complaint:   Chief Complaint    Medication Problem; Follow-up; Medication Refill; Anxiety; Panic Attack     Visit Diagnosis:    ICD-9-CM ICD-10-CM   1. Generalized anxiety disorder 300.02 F41.1     History of Present Illness:  Patient is a 30 year old African-American female who presents to the clinic for a follow up of her anxiety and medication management. States that on the Zoloft she got very paranoid, had trouble with her memory and had to leave work. She would like to go back on Prozac which she reports was very helpful. Continues to feel anxious, very overwhelmed with her life. Denies any suicidal thoughts. Continues to function at her job.  Sleeping well and eating well.   Past Psychiatric History: No suicide attempts, no previous hospitalizations.  Previous Psychotropic Medications: Yes   Substance Abuse History in the last 12 months:  No.  Consequences of Substance Abuse: Negative  Past Medical History:  Past Medical History  Diagnosis Date  . Hyperlipidemia   . Depression   . Anxiety   . Bipolar disorder (HCC)   . Obesity (BMI 35.0-39.9 without comorbidity) (HCC) 12/24/2015    Past Surgical History  Procedure Laterality Date  . Cesarean section    . Wisdom tooth extraction      Family Psychiatric History: Mother and grandmother have depression, have been hospitalized.  Family History:  Family History  Problem Relation Age of Onset  . Heart disease Mother   . Diabetes Mother   . Anxiety disorder Mother   . Heart disease Father   . Diabetes Father   . Colon cancer Paternal Aunt   . Heart disease Maternal Grandmother   . Colon cancer Paternal Grandfather     Social History:   Social History   Social History  . Marital  Status: Married    Spouse Name: N/A  . Number of Children: N/A  . Years of Education: N/A   Social History Main Topics  . Smoking status: Former Smoker    Quit date: 09/16/2012  . Smokeless tobacco: Never Used  . Alcohol Use: No  . Drug Use: No  . Sexual Activity: Yes    Birth Control/ Protection: Condom   Other Topics Concern  . None   Social History Narrative    Additional Social History: Patient is married and has been married for 3 years and is in a good relationship.  Allergies:  No Known Allergies  Metabolic Disorder Labs: No results found for: HGBA1C, MPG No results found for: PROLACTIN Lab Results  Component Value Date   CHOL 171 12/28/2015   TRIG 400* 12/28/2015   HDL 22* 12/28/2015   LDLCALC 69 12/28/2015     Current Medications: Current Outpatient Prescriptions  Medication Sig Dispense Refill  . sertraline (ZOLOFT) 25 MG tablet Take 1 tablet (25 mg total) by mouth daily. (Patient not taking: Reported on 01/27/2016) 30 tablet 1   No current facility-administered medications for this visit.    Neurologic: Headache: everyday headaches for past 2 months Seizure: No Paresthesias:No  Musculoskeletal: Strength & Muscle Tone: within normal limits Gait & Station: normal Patient leans: N/A  Psychiatric Specialty Exam: ROS  Blood pressure 122/84, pulse 99, temperature 98.2 F (36.8 C), temperature source Tympanic, height 5' 4.2" (1.631 m),  weight 215 lb (97.523 kg), last menstrual period 12/28/2015, SpO2 96 %.Body mass index is 36.66 kg/(m^2).  General Appearance: Casual  Eye Contact:  Fair  Speech:  Clear and Coherent  Volume:  Normal  Mood:  Dysphoric  Affect:  anxious  Thought Process:  Coherent  Orientation:  Full (Time, Place, and Person)  Thought Content:  Rumination  Suicidal Thoughts:  No  Homicidal Thoughts:  No  Memory:  Immediate;   Fair Recent;   Fair Remote;   Fair  Judgement:  Fair  Insight:  Fair  Psychomotor Activity:  Normal   Concentration:  Fair  Recall:  [employer] of Knowledge:Fair  Language: Fair  Akathisia:  No  Handed:  Right  AIMS (if indicated):   Assets:  Communication Skills Desire for Improvement  ADL's:  Intact  Cognition: WNL  Sleep:  good    Treatment Plan Summary: Medication management   Generalized anxiety Disorder  Discontinue Zoloft. Start Prozac at  po qd.  Discussed starting to see a therapist, given a list to choose from. Take vistaril  po three times daily as needed for anxiety.  Rule out bipolar disorder Continue to monitor her mood symptoms.  The clinic in 1 month time or call before if necessary.   Patrick North, MD 4/12/20172:34 PM

## 2016-02-25 ENCOUNTER — Encounter: Payer: Self-pay | Admitting: Psychiatry

## 2016-02-25 ENCOUNTER — Ambulatory Visit (INDEPENDENT_AMBULATORY_CARE_PROVIDER_SITE_OTHER): Payer: 59 | Admitting: Psychiatry

## 2016-02-25 VITALS — BP 120/78 | HR 98 | Temp 98.7°F | Ht 64.0 in | Wt 214.6 lb

## 2016-02-25 DIAGNOSIS — F411 Generalized anxiety disorder: Secondary | ICD-10-CM | POA: Diagnosis not present

## 2016-02-25 MED ORDER — FLUOXETINE HCL 20 MG PO CAPS: 20.0000 mg | ORAL_CAPSULE | Freq: Every day | ORAL | Status: DC

## 2016-02-25 MED ORDER — HYDROXYZINE PAMOATE 25 MG PO CAPS: 25.0000 mg | ORAL_CAPSULE | Freq: Three times a day (TID) | ORAL | Status: DC | PRN

## 2016-02-25 NOTE — Progress Notes (Signed)
Patient ID: [name], female   DOB: 04/20/1986, 30 y.o.   MRN: [id number]   Psychiatric Progress note  Patient Identification: NATHALEE SMARR MRN:  [id number] Date of Evaluation:  02/25/2016 Referral Source:  Chief Complaint:   Chief Complaint    Follow-up; Medication Refill     Visit Diagnosis:    ICD-9-CM ICD-10-CM   1. Generalized anxiety disorder 300.02 F41.1     History of Present Illness:  Patient is a 30 year old African-American female who presents to the clinic for a follow up of her anxiety and medication management. States she is tolerating the Prozac much better and her anxiety has improved significantly. States she has had fewer anxiety symptoms past few weeks. She is not as overwhelmed. Fair sleep and appetite. She was offered a promotion at work and is weighing the pros and cons of taking up this job. Denies any suicidal thoughts.   Past Psychiatric History: No suicide attempts, no previous hospitalizations.  Previous Psychotropic Medications: Yes   Substance Abuse History in the last 12 months:  No.  Consequences of Substance Abuse: Negative  Past Medical History:  Past Medical History  Diagnosis Date  . Hyperlipidemia   . Depression   . Anxiety   . Bipolar disorder (HCC)   . Obesity (BMI 35.0-39.9 without comorbidity) (HCC) 12/24/2015    Past Surgical History  Procedure Laterality Date  . Cesarean section    . Wisdom tooth extraction      Family Psychiatric History: Mother and grandmother have depression, have been hospitalized.  Family History:  Family History  Problem Relation Age of Onset  . Heart disease Mother   . Diabetes Mother   . Anxiety disorder Mother   . Heart disease Father   . Diabetes Father   . Colon cancer Paternal Aunt   . Heart disease Maternal Grandmother   . Colon cancer Paternal Grandfather     Social History:   Social History   Social History  . Marital Status: Married    Spouse Name: N/A  . Number of  Children: N/A  . Years of Education: N/A   Social History Main Topics  . Smoking status: Former Smoker    Quit date: 09/16/2012  . Smokeless tobacco: Never Used  . Alcohol Use: No  . Drug Use: No  . Sexual Activity: Yes    Birth Control/ Protection: Condom   Other Topics Concern  . None   Social History Narrative    Additional Social History: Patient is married and has been married for 3 years and is in a good relationship.  Allergies:  No Known Allergies  Metabolic Disorder Labs: No results found for: HGBA1C, MPG No results found for: PROLACTIN Lab Results  Component Value Date   CHOL 171 12/28/2015   TRIG 400* 12/28/2015   HDL 22* 12/28/2015   LDLCALC 69 12/28/2015     Current Medications: Current Outpatient Prescriptions  Medication Sig Dispense Refill  . FLUoxetine (PROZAC) 20 MG capsule Take 1 capsule (20 mg total) by mouth daily. 30 capsule 1  . hydrOXYzine (VISTARIL) 25 MG capsule Take 1 capsule (25 mg total) by mouth 3 (three) times daily as needed. 30 capsule 0   No current facility-administered medications for this visit.    Neurologic: Headache: everyday headaches for past 2 months Seizure: No Paresthesias:No  Musculoskeletal: Strength & Muscle Tone: within normal limits Gait & Station: normal Patient leans: N/A  Psychiatric Specialty Exam: ROS  Blood pressure 120/78, pulse 98, temperature 98.7 F (  37.1 C), temperature source Tympanic, height 5\' 4"  (1.626 m), weight 214 lb 9.6 oz (97.342 kg), last menstrual period 12/28/2015, SpO2 94 %.Body mass index is 36.82 kg/(m^2).  General Appearance: Casual  Eye Contact:  Fair  Speech:  Clear and Coherent  Volume:  Normal  Mood:  improved  Affect:  Better, smiling more  Thought Process:  Coherent  Orientation:  Full (Time, Place, and Person)  Thought Content:  normal  Suicidal Thoughts:  No  Homicidal Thoughts:  No  Memory:  Immediate;   Fair Recent;   Fair Remote;   Fair  Judgement:  Fair   Insight:  Fair  Psychomotor Activity:  Normal  Concentration:  Fair  Recall:  [employer]  Fund of Knowledge:Fair  Language: Fair  Akathisia:  No  Handed:  Right  AIMS (if indicated):   Assets:  Communication Skills Desire for Improvement  ADL's:  Intact  Cognition: WNL  Sleep:  good    Treatment Plan Summary: Medication management   Generalized anxiety Disorder  Increase Prozac to 20mg  po qd.  Discussed starting to see a therapist, given a list to choose from. Take vistaril 25mg  po three times daily as needed for anxiety.  Rule out bipolar disorder Continue to monitor her mood symptoms.  The clinic in 1 month time or call before if necessary.   Patrick NorthAVI, Eashan Schipani, MD 5/11/20172:49 PM

## 2016-03-24 ENCOUNTER — Encounter: Payer: Self-pay | Admitting: Psychiatry

## 2016-03-24 ENCOUNTER — Ambulatory Visit (INDEPENDENT_AMBULATORY_CARE_PROVIDER_SITE_OTHER): Payer: 59 | Admitting: Psychiatry

## 2016-03-24 VITALS — BP 109/68 | HR 80 | Temp 97.3°F | Ht 60.0 in | Wt 216.0 lb

## 2016-03-24 DIAGNOSIS — F411 Generalized anxiety disorder: Secondary | ICD-10-CM | POA: Diagnosis not present

## 2016-03-24 MED ORDER — FLUOXETINE HCL 20 MG PO CAPS: 20.0000 mg | ORAL_CAPSULE | Freq: Every day | ORAL | Status: DC

## 2016-03-24 MED ORDER — HYDROXYZINE PAMOATE 25 MG PO CAPS: 25.0000 mg | ORAL_CAPSULE | Freq: Three times a day (TID) | ORAL | Status: DC | PRN

## 2016-03-24 NOTE — Progress Notes (Signed)
Patient ID: Andrea Roberson, female   DOB: 09-20-1986, 30 y.o.   MRN: [id number]   Psychiatric Progress note  Patient Identification: Andrea Roberson MRN:  [id number] Date of Evaluation:  03/24/2016 Chief Complaint:    Visit Diagnosis:    ICD-9-CM ICD-10-CM   1. Generalized anxiety disorder 300.02 F41.1     History of Present Illness:  Patient is a 30 year old African-American female who presents to the clinic for a follow up of her anxiety and medication management. States she has tolerated the increase in Prozac well, she is less anxious and mood significantly improved. She does not get as worked up with situations that used to bother her.Fair sleep and appetite. She was offered a promotion at work and it was taken back, management said they made a mistake. Patient states she handled this situation well. She is stressed due to her son developing an allergy last night to cashews. States that made her more anxious but is doing okay overall. Denies any suicidal thoughts.   Past Psychiatric History: No suicide attempts, no previous hospitalizations.  Previous Psychotropic Medications: Yes   Substance Abuse History in the last 12 months:  No.  Consequences of Substance Abuse: Negative  Past Medical History:  Past Medical History  Diagnosis Date  . Hyperlipidemia   . Depression   . Anxiety   . Bipolar disorder (HCC)   . Obesity (BMI 35.0-39.9 without comorbidity) (HCC) 12/24/2015    Past Surgical History  Procedure Laterality Date  . Cesarean section    . Wisdom tooth extraction      Family Psychiatric History: Mother and grandmother have depression, have been hospitalized.  Family History:  Family History  Problem Relation Age of Onset  . Heart disease Mother   . Diabetes Mother   . Anxiety disorder Mother   . Heart disease Father   . Diabetes Father   . Colon cancer Paternal Aunt   . Heart disease Maternal Grandmother   . Colon cancer Paternal Grandfather     Social  History:   Social History   Social History  . Marital Status: Married    Spouse Name: N/A  . Number of Children: N/A  . Years of Education: N/A   Social History Main Topics  . Smoking status: Former Smoker    Quit date: 09/16/2012  . Smokeless tobacco: Never Used  . Alcohol Use: No  . Drug Use: No  . Sexual Activity: Yes    Birth Control/ Protection: Condom   Other Topics Concern  . None   Social History Narrative    Additional Social History: Patient is married and has been married for 3 years and is in a good relationship.  Allergies:  No Known Allergies  Metabolic Disorder Labs: No results found for: HGBA1C, MPG No results found for: PROLACTIN Lab Results  Component Value Date   CHOL 171 12/28/2015   TRIG 400* 12/28/2015   HDL 22* 12/28/2015   LDLCALC 69 12/28/2015     Current Medications: Current Outpatient Prescriptions  Medication Sig Dispense Refill  . FLUoxetine (PROZAC) 20 MG capsule Take 1 capsule (20 mg total) by mouth daily. 30 capsule 1  . hydrOXYzine (VISTARIL) 25 MG capsule Take 1 capsule (25 mg total) by mouth 3 (three) times daily as needed. 30 capsule 0   No current facility-administered medications for this visit.    Neurologic: Headache: everyday headaches for past 2 months Seizure: No Paresthesias:No  Musculoskeletal: Strength & Muscle Tone: within normal limits Gait & Station:  normal Patient leans: N/A  Psychiatric Specialty Exam: ROS  Blood pressure 109/68, pulse 80, temperature 97.3 F (36.3 C), height 5' (1.524 m), weight 216 lb (97.977 kg), last menstrual period 03/07/2016, SpO2 96 %.Body mass index is 42.18 kg/(m^2).  General Appearance: Casual  Eye Contact:  Fair  Speech:  Clear and Coherent  Volume:  Normal  Mood:  improved  Affect:  normal  Thought Process:  Coherent  Orientation:  Full (Time, Place, and Person)  Thought Content:  normal  Suicidal Thoughts:  No  Homicidal Thoughts:  No  Memory:  Immediate;    Fair Recent;   Fair Remote;   Fair  Judgement:  Fair  Insight:  Fair  Psychomotor Activity:  Normal  Concentration:  Fair  Recall:  [employer]  Fund of Knowledge:Fair  Language: Fair  Akathisia:  No  Handed:  Right  AIMS (if indicated):   Assets:  Communication Skills Desire for Improvement  ADL's:  Intact  Cognition: WNL  Sleep:  good    Treatment Plan Summary: Medication management   Generalized anxiety Disorder  Continue Prozac at 20mg  po qd.  Discussed starting to see a therapist, given a list to choose from. Take vistaril 25mg  po three times daily as needed for anxiety.  Rule out bipolar disorder Continue to monitor her mood symptoms.  The clinic in 3 month time or call before if necessary.   Patrick NorthAVI, Jaquelynn Wanamaker, MD 6/8/20172:00 PM

## 2016-05-18 ENCOUNTER — Other Ambulatory Visit: Payer: 59

## 2016-05-18 DIAGNOSIS — Z021 Encounter for pre-employment examination: Secondary | ICD-10-CM

## 2016-05-19 LAB — MEASLES/MUMPS/RUBELLA IMMUNITY
MUMPS ABS, IGG: 28 AU/mL (ref >10.9)
RUBEOLA AB, IGG: 40.6 AU/mL (ref >29.9)
Rubella Antibodies, IGG: 1.3 index (ref >0.99)

## 2016-05-19 LAB — HEPATITIS B SURFACE ANTIBODY,QUALITATIVE: Hep B Surface Ab, Qual: REACTIVE

## 2016-05-22 LAB — QUANTIFERON IN TUBE
QFT TB AG MINUS NIL VALUE: <0 IU/mL
QUANTIFERON NIL VALUE: 0.05 [IU]/mL
QUANTIFERON TB AG VALUE: 0.03 [IU]/mL
QUANTIFERON TB GOLD: NEGATIVE

## 2016-05-22 LAB — QUANTIFERON TB GOLD ASSAY (BLOOD)

## 2016-05-23 ENCOUNTER — Encounter: Payer: Self-pay | Admitting: Family Medicine

## 2016-06-23 ENCOUNTER — Ambulatory Visit: Payer: 59 | Admitting: Psychiatry

## 2016-07-06 ENCOUNTER — Ambulatory Visit (INDEPENDENT_AMBULATORY_CARE_PROVIDER_SITE_OTHER): Payer: BC Managed Care – PPO | Admitting: Psychiatry

## 2016-07-06 ENCOUNTER — Encounter: Payer: Self-pay | Admitting: Psychiatry

## 2016-07-06 VITALS — BP 118/76 | HR 74 | Ht 60.0 in | Wt 205.8 lb

## 2016-07-06 DIAGNOSIS — F411 Generalized anxiety disorder: Secondary | ICD-10-CM | POA: Diagnosis not present

## 2016-07-06 MED ORDER — FLUOXETINE HCL 10 MG PO CAPS: 30.0000 mg | ORAL_CAPSULE | Freq: Every day | ORAL | 1 refills | Status: DC

## 2016-07-06 MED ORDER — FLUOXETINE HCL 20 MG PO CAPS: 20.0000 mg | ORAL_CAPSULE | Freq: Every day | ORAL | 2 refills | Status: DC

## 2016-07-06 NOTE — Progress Notes (Signed)
Patient ID: Andrea Roberson, female   DOB: 04/20/1986, 30 y.o.   MRN: [id number]   Psychiatric Progress note  Patient Identification: Andrea Roberson MRN:  [id number] Date of Evaluation:  07/06/2016 Chief Complaint:    Visit Diagnosis:  No diagnosis found.  History of Present Illness:  Patient is a 30 year old African-American female who presents to the clinic for a follow up of her anxiety and medication management. 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 Doing well mood wise. Tolerating the Prozac well, denies any side effects. However reports anxiety once a week that debilitates her. .Denies any suicidal thoughts.   Past Psychiatric History: No suicide attempts, no previous hospitalizations.  Previous Psychotropic Medications: Yes   Substance Abuse History in the last 12 months:  No.  Consequences of Substance Abuse: Negative  Past Medical History:  Past Medical History:  Diagnosis Date  . Anxiety   . Bipolar disorder (HCC)   . Depression   . Hyperlipidemia   . Obesity (BMI 35.0-39.9 without comorbidity) (HCC) 12/24/2015    Past Surgical History:  Procedure Laterality Date  . CESAREAN SECTION    . WISDOM TOOTH EXTRACTION      Family Psychiatric History: Mother and grandmother have depression, have been hospitalized.  Family History:  Family History  Problem Relation Age of Onset  . Heart disease Mother   . Diabetes Mother   . Anxiety disorder Mother   . Heart disease Father   . Diabetes Father   . Colon cancer Paternal Aunt   . Heart disease Maternal Grandmother   . Colon cancer Paternal Grandfather     Social History:   Social History   Social History  . Marital status: Married    Spouse name: N/A  . Number of children: N/A  . Years of education: N/A   Social History Main Topics  . Smoking status: Former Smoker    Quit date: 09/16/2012  . Smokeless tobacco: Never Used  . Alcohol use No  . Drug use: No  . Sexual activity: Yes    Birth control/  protection: Condom   Other Topics Concern  . Not on file   Social History Narrative  . No narrative on file    Additional Social History: Patient is married and has been married for 3 years and is in a good relationship.  Allergies:  No Known Allergies  Metabolic Disorder Labs: No results found for: HGBA1C, MPG No results found for: PROLACTIN Lab Results  Component Value Date   CHOL 171 12/28/2015   TRIG 400 (H) 12/28/2015   HDL 22 (L) 12/28/2015   LDLCALC 69 12/28/2015     Current Medications: Current Outpatient Prescriptions  Medication Sig Dispense Refill  . FLUoxetine (PROZAC) 20 MG capsule Take 1 capsule (20 mg total) by mouth daily. 30 capsule 2  . hydrOXYzine (VISTARIL) 25 MG capsule Take 1 capsule (25 mg total) by mouth 3 (three) times daily as needed. 30 capsule 2   No current facility-administered medications for this visit.     Neurologic: Headache: everyday headaches for past 2 months Seizure: No Paresthesias:No  Musculoskeletal: Strength & Muscle Tone: within normal limits Gait & Station: normal Patient leans: N/A  Psychiatric Specialty Exam: ROS  There were no vitals taken for this visit.There is no height or weight on file to calculate BMI.  General Appearance: Casual  Eye Contact:  Fair  Speech:  Clear and Coherent  Volume:  Normal  Mood:  improved  Affect:  normal  Thought Process:  Coherent  Orientation:  Full (Time, Place, and Person)  Thought Content:  normal  Suicidal Thoughts:  No  Homicidal Thoughts:  No  Memory:  Immediate;   Fair Recent;   Fair Remote;   Fair  Judgement:  Fair  Insight:  Fair  Psychomotor Activity:  Normal  Concentration:  Fair  Recall:  [employer] of Knowledge:Fair  Language: Fair  Akathisia:  No  Handed:  Right  AIMS (if indicated):   Assets:  Communication Skills Desire for Improvement  ADL's:  Intact  Cognition: WNL  Sleep:  good    Treatment Plan Summary: Medication management   Generalized  anxiety Disorder  Increase Prozac to 30mg  po qd.   Rule out bipolar disorder Continue to monitor her mood symptoms.  The clinic in 1 month time or call before if necessary.   Patrick North, MD 9/20/20173:58 PM

## 2016-08-03 ENCOUNTER — Ambulatory Visit: Payer: Self-pay | Admitting: Psychiatry

## 2016-08-04 ENCOUNTER — Ambulatory Visit (INDEPENDENT_AMBULATORY_CARE_PROVIDER_SITE_OTHER): Payer: BC Managed Care – PPO | Admitting: Licensed Clinical Social Worker

## 2016-08-04 DIAGNOSIS — F411 Generalized anxiety disorder: Secondary | ICD-10-CM

## 2016-08-04 NOTE — Progress Notes (Signed)
Comprehensive Clinical Assessment (CCA) Note  08/04/2016 Andrea Roberson [id number]  Visit Diagnosis:      ICD-9-CM ICD-10-CM   1. Generalized anxiety disorder 300.02 F41.1       CCA Part One  Part One has been completed on paper by the patient.  (See scanned document in Chart Review)  CCA Part Two A  Intake/Chief Complaint:  CCA Intake With Chief Complaint CCA Part Two Date: 08/04/16 CCA Part Two Time: 1600 Chief Complaint/Presenting Problem: My anxiety is really crazy.  I have difficulty with stress. Patients Currently Reported Symptoms/Problems: I get jittery, panicky and sometimes I have to step back deep breath and calm down.  I get sweaty, headaches when I get overwhelmed.  Heart racing, rapid breathing when I get anxious.  I have been battling this since age 30.  My Grandmother passed when I was 3.   Individual's Strengths: I am creative and passionate.  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  Individual's Preferences: to reduce worry and to slow down.  Live in the moment. Individual's Abilities: communicates well Type of Services Patient Feels Are Needed: therapy, medication management  Mental Health Symptoms Depression:  Depression: Difficulty Concentrating, Change in energy/activity, Irritability, Tearfulness  Mania:  Mania: N/A  Anxiety:   Anxiety: Difficulty concentrating, Worrying, Fatigue, Irritability, Tension  Psychosis:  Psychosis: N/A  Trauma:  Trauma: N/A  Obsessions:  Obsessions: N/A  Compulsions:  Compulsions: N/A  Inattention:  Inattention: N/A  Hyperactivity/Impulsivity:  Hyperactivity/Impulsivity: N/A  Oppositional/Defiant Behaviors:  Oppositional/Defiant Behaviors: N/A  Borderline Personality:  Emotional Irregularity: N/A  Other Mood/Personality Symptoms:      Mental Status Exam Appearance and self-care  Stature:  Stature: Average  Weight:  Weight: Average weight  Clothing:  Clothing: Neat/clean  Grooming:  Grooming: Normal  Cosmetic use:   Cosmetic Use: Age appropriate  Posture/gait:  Posture/Gait: Normal  Motor activity:  Motor Activity: Not Remarkable  Sensorium  Attention:  Attention: Normal  Concentration:  Concentration: Normal  Orientation:  Orientation: X5  Recall/memory:  Recall/Memory: Normal  Affect and Mood  Affect:  Affect: Appropriate  Mood:  Mood: Euthymic  Relating  Eye contact:  Eye Contact: Normal  Facial expression:  Facial Expression: Responsive  Attitude toward examiner:  Attitude Toward Examiner: Cooperative  Thought and Language  Speech flow: Speech Flow: Normal  Thought content:  Thought Content: Appropriate to mood and circumstances  Preoccupation:     Hallucinations:     Organization:     Company secretary of Knowledge:  Fund of Knowledge: Average  Intelligence:  Intelligence: Average  Abstraction:  Abstraction: Normal  Judgement:  Judgement: Normal  Reality Testing:  Reality Testing: Adequate  Insight:  Insight: Good  Decision Making:  Decision Making: Normal  Social Functioning  Social Maturity:  Social Maturity: Responsible  Social Judgement:  Social Judgement: Normal  Stress  Stressors:  Stressors: Work, Family conflict, Illness (Finding a balance with work, time)  Coping Ability:  Coping Ability: Building surveyor Deficits:     Supports:      Family and Psychosocial History: Family history Marital status: Married Number of Years Married: 3 What types of issues is patient dealing with in the relationship?: worrying, "He says I nag." Are you sexually active?: Yes What is your sexual orientation?: heterosexual Does patient have children?: Yes How many children?: 1 ([name], 2) How is patient's relationship with their children?: He is my life. I love being a mom.  It is the best and hardest  thing that I have done in my life.  Childhood History:  Childhood History By whom was/is the patient raised?: Both parents Additional childhood history information: Born in  [place].  Describes childhood as fairly normal.  My parents worked a lot so I was with my Grandmother and cousins.  Things were normal until age 15 or 42 until financial problems happened.  We got kicked out of our house and moved in with Paternal Grandmother. Description of patient's relationship with caregiver when they were a child: Mother: we were like oil and water.  We always fought.  We did not get close until I got older.  Father: I am a daddy's girl.  I love my dad. Patient's description of current relationship with people who raised him/her: Mother: very close, worry about her a lot.  SHe has health problems.  Father: good relationship.  CLoser to mom now. How were you disciplined when you got in trouble as a child/adolescent?: spanked Does patient have siblings?: Yes Number of Siblings: 1 (Robbie 47) Description of patient's current relationship with siblings: We are not good.  Relationship changed about 8 or 9 years ago.  Did patient suffer any verbal/emotional/physical/sexual abuse as a child?: No Did patient suffer from severe childhood neglect?: No Has patient ever been sexually abused/assaulted/raped as an adolescent or adult?: No Was the patient ever a victim of a crime or a disaster?: No Witnessed domestic violence?: No Has patient been effected by domestic violence as an adult?: No  CCA Part Two B  Employment/Work Situation: Employment / Work Situation Employment situation: Employed Where is patient currently employed?: [employer] long has patient been employed?: Since August 2017 Patient's job has been impacted by current illness: No What is the longest time patient has a held a job?: 63yrs Where was the patient employed at that time?: [employer] as an Environmental health practitioner Has patient ever been in the [employer]?: No  Education: Education Name of [employer] School: Web designer [employer] Did [place] Graduate From [employer]?: Yes Did  You Attend [employer]?: Yes ([employer]) What Type of College Degree Do you Have?: Bachelor Did Designer, television/film set?: No What Was Your Major?: Early Childhood Education Did You Have An Individualized Education Program (IIEP): No Did You Have Any Difficulty At School?: No  Religion: Religion/Spirituality Are You A Religious Person?: Yes What is Your Religious Affiliation?: Christian How Might This Affect Treatment?: denies  Leisure/Recreation: Leisure / Recreation Leisure and Hobbies: Production manager, paint, craft, hands on  Exercise/Diet: Exercise/Diet Do You Exercise?: No Have You Gained or Lost A Significant Amount of Weight in the Past Six Months?: No Do You Follow a Special Diet?: No Do You Have Any Trouble Sleeping?: Yes Explanation of Sleeping Difficulties: difficulty falling asleep or staying asleep  CCA Part Two C  Alcohol/Drug Use: Alcohol / Drug Use Pain Medications: denies Prescriptions: Prozac Over the Counter: vitamin, ibuprofen as needed History of alcohol / drug use?: Yes Substance #1 Name of Substance 1: Alcohol/WIne 1 - Age of First Use:  15 1 - Amount (size/oz): 5 -10 ounces 1 - Frequency: weekly 1 - Duration: 4-6weeks 1 - Last Use / Amount: 08/01/2016                    CCA Part Three  ASAM's:  Six Dimensions of Multidimensional Assessment  Dimension 1:  Acute Intoxication and/or Withdrawal Potential:     Dimension 2:  Biomedical Conditions  and Complications:     Dimension 3:  Emotional, Behavioral, or Cognitive Conditions and Complications:     Dimension 4:  Readiness to Change:     Dimension 5:  Relapse, Continued use, or Continued Problem Potential:     Dimension 6:  Recovery/Living Environment:      Substance use Disorder (SUD)    Social Function:  Social Functioning Social Maturity: Responsible Social Judgement: Normal  Stress:  Stress Stressors: Work, Family conflict, Illness  (Finding a balance with work, time) Coping Ability: Overwhelmed Patient Takes Medications The Way The Doctor Instructed?: No Priority Risk: Low Acuity  Risk Assessment- Self-Harm Potential: Risk Assessment For Self-Harm Potential Thoughts of Self-Harm: No current thoughts Method: No plan Availability of Means: No access/NA  Risk Assessment -Dangerous to Others Potential: Risk Assessment For Dangerous to Others Potential Method: No Plan Availability of Means: No access or NA Intent: Vague intent or NA Notification Required: No need or identified person  DSM5 Diagnoses: Patient Active Problem List   Diagnosis Date Noted  . Generalized anxiety disorder 01/27/2016  . Shoulder pain, right 12/28/2015  . Obesity (BMI 35.0-39.9 without comorbidity) 12/24/2015  . Bipolar 2 disorder (HCC) 12/08/2015    Patient Centered Plan: Will complete at the next session.  Recommendations for Services/Supports/Treatments: Recommendations for Services/Supports/Treatments Recommendations For Services/Supports/Treatments: Medication Management  Treatment Plan Summary:    Referrals to Alternative Service(s): Referred to Alternative Service(s):   Place:   Date:   Time:    Referred to Alternative Service(s):   Place:   Date:   Time:    Referred to Alternative Service(s):   Place:   Date:   Time:    Referred to Alternative Service(s):   Place:   Date:   Time:     Marinda Elkicole M Peacock

## 2016-09-06 ENCOUNTER — Ambulatory Visit: Payer: BC Managed Care – PPO | Admitting: Licensed Clinical Social Worker

## 2016-09-06 DIAGNOSIS — F411 Generalized anxiety disorder: Secondary | ICD-10-CM

## 2016-09-15 NOTE — Progress Notes (Signed)
   THERAPIST PROGRESS NOTE  Session Time: 82  Participation Level: Active  Behavioral Response: NeatAlertAnxious  Type of Therapy: Individual Therapy  Treatment Goals addressed: Anxiety  Interventions: CBT, Motivational Interviewing and Solution Focused  Summary: Andrea Roberson is a 30 y.o. female who presents with continued symptoms of her diagnosis.   Patient reports that she has been in a good mood and she continues to be anxious.  She reports that she gets frustrated easily with not being able to let go of control.  Patient reports that she likes things in a specific order done before she is able to relax for the night.  She reports that she wants to learn to let go of anxious, worrying and being on edge.  Denies having any time to herself or friends in the area.  LCSW discussed what psychotherapy is and is not and the importance of the therapeutic relationship to include open and honest communication between client and therapist and building trust.  Reviewed advantages and disadvantages of the therapeutic process and limitations to the therapeutic relationship including LCSW's role in maintaining the safety of the client, others and those in client's care.   Suicidal/Homicidal: No  Therapist Response: Therapist met with Patient in an initial therapy session to assess current mood and to build rapport. Therapist engaged Patient in discussion about her life and what is going well for her. Therapist provided support for Patient as she shared details about her life, her current stressors, mood, coping skills, her past, and her children. Therapist prompted Patient to discuss her support system and ways that she manages her daily stress, anger, and frustrations.   Plan: Return again in 2 weeks.  Diagnosis: Axis I: Generalized Anxiety Disorder    Axis II: No diagnosis    Lubertha South, LCSW 09/06/2016

## 2016-09-19 ENCOUNTER — Ambulatory Visit (INDEPENDENT_AMBULATORY_CARE_PROVIDER_SITE_OTHER): Payer: BC Managed Care – PPO | Admitting: Psychiatry

## 2016-09-19 DIAGNOSIS — F411 Generalized anxiety disorder: Secondary | ICD-10-CM

## 2016-09-19 MED ORDER — FLUOXETINE HCL 10 MG PO CAPS: 30.0000 mg | ORAL_CAPSULE | Freq: Every day | ORAL | 1 refills | Status: DC

## 2016-09-19 NOTE — Progress Notes (Signed)
Patient ID: Andrea Roberson, female   DOB: 02/26/1986, 30 y.o.   MRN: [id number]   Psychiatric Progress note  Patient Identification: Andrea Roberson On MRN:  [id number] Date of Evaluation:  09/19/2016 Chief Complaint:    Visit Diagnosis:    ICD-9-CM ICD-10-CM   1. Generalized anxiety disorder 300.02 F41.1     History of Present Illness:  Patient is a 30 year old African-American female who presents to the clinic for a follow up of her anxiety and medication management. Patient reports that she has been able to tolerate her on Prozac at 30 mg and is enjoying her new job. Does have some panic symptoms once in a while but overall doing well. Denies any suicidal thoughts.    Past Psychiatric History: No suicide attempts, no previous hospitalizations.  Previous Psychotropic Medications: Yes   Substance Abuse History in the last 12 months:  No.  Consequences of Substance Abuse: Negative  Past Medical History:  Past Medical History:  Diagnosis Date  . Anxiety   . Bipolar disorder (HCC)   . Depression   . Hyperlipidemia   . Obesity (BMI 35.0-39.9 without comorbidity) 12/24/2015    Past Surgical History:  Procedure Laterality Date  . CESAREAN SECTION    . WISDOM TOOTH EXTRACTION      Family Psychiatric History: Mother and grandmother have depression, have been hospitalized.  Family History:  Family History  Problem Relation Age of Onset  . Heart disease Mother   . Diabetes Mother   . Anxiety disorder Mother   . Heart disease Father   . Diabetes Father   . Colon cancer Paternal Aunt   . Heart disease Maternal Grandmother   . Colon cancer Paternal Grandfather     Social History:   Social History   Social History  . Marital status: Married    Spouse name: N/A  . Number of children: N/A  . Years of education: N/A   Social History Main Topics  . Smoking status: Former Smoker    Quit date: 09/16/2012  . Smokeless tobacco: Never Used  . Alcohol use No  . Drug use: No   . Sexual activity: Yes    Birth control/ protection: Condom   Other Topics Concern  . Not on file   Social History Narrative  . No narrative on file    Additional Social History: Patient is married and has been married for 3 years and is in a good relationship.  Allergies:  No Known Allergies  Metabolic Disorder Labs: No results found for: HGBA1C, MPG No results found for: PROLACTIN Lab Results  Component Value Date   CHOL 171 12/28/2015   TRIG 400 (H) 12/28/2015   HDL 22 (L) 12/28/2015   LDLCALC 69 12/28/2015     Current Medications: Current Outpatient Prescriptions  Medication Sig Dispense Refill  . FLUoxetine (PROZAC) 10 MG capsule Take 3 capsules (30 mg total) by mouth daily. 90 capsule 1   No current facility-administered medications for this visit.     Neurologic: Headache: everyday headaches for past 2 months Seizure: No Paresthesias:No  Musculoskeletal: Strength & Muscle Tone: within normal limits Gait & Station: normal Patient leans: N/A  Psychiatric Specialty Exam: ROS  There were no vitals taken for this visit.There is no height or weight on file to calculate BMI.  General Appearance: Casual  Eye Contact:  Fair  Speech:  Clear and Coherent  Volume:  Normal  Mood:  improved  Affect:  normal  Thought Process:  Coherent  Orientation:  Full (Time, Place, and Person)  Thought Content:  normal  Suicidal Thoughts:  No  Homicidal Thoughts:  No  Memory:  Immediate;   Fair Recent;   Fair Remote;   Fair  Judgement:  Fair  Insight:  Fair  Psychomotor Activity:  Normal  Concentration:  Fair  Recall:  [employer]  Fund of Knowledge:Fair  Language: Fair  Akathisia:  No  Handed:  Right  AIMS (if indicated):   Assets:  Communication Skills Desire for Improvement  ADL's:  Intact  Cognition: WNL  Sleep:  good    Treatment Plan Summary: Medication management   Generalized anxiety Disorder  Continue Prozac to 30mg  po qd.   Continue to monitor her  mood symptoms.  The clinic in 2 month time or call before if necessary.   Patrick NorthAVI, Caili Escalera, MD 12/4/20174:00 PM

## 2016-10-18 ENCOUNTER — Ambulatory Visit (INDEPENDENT_AMBULATORY_CARE_PROVIDER_SITE_OTHER): Payer: BC Managed Care – PPO | Admitting: Licensed Clinical Social Worker

## 2016-10-18 DIAGNOSIS — F411 Generalized anxiety disorder: Secondary | ICD-10-CM | POA: Diagnosis not present

## 2016-10-24 NOTE — Progress Notes (Signed)
   THERAPIST PROGRESS NOTE  Session Time: 5345  Participation Level: Active  Behavioral Response: NeatAlertDepressed  Type of Therapy: Individual Therapy  Treatment Goals addressed: Coping  Interventions: CBT, Motivational Interviewing, Solution Focused, Strength-based and Supportive  Summary: Andrea Roberson is a 31 y.o. female who presents with a depressed mood.  She reports that she has been sleeping a lot lately and isolating herself from her family.  She denies any triggers but reports that her son is getting difficult to manage.  She reports that work is going well and she will attend a training development for the next few days.  Discussion on how to engage and interact with others. She reports that she struggles with communicating with others and driving long distance.  She reports that a [employer] worker will ride with her but she does not know the person.  She reports that she will utilize coping skills taught during session.   Suicidal/Homicidal: No  Therapist Response: Assessed pt current functioning per pt report.  Explored w/ pt contributing factors to increased depressed mood.  Validated feelings and stressors and focused pt on using skills of reframing and conflict resolution for coping.  Plan: Return again in 2 weeks.  Diagnosis: Axis I: Generalized Anxiety Disorder    Axis II: No diagnosis    Andrea Elkicole M Marlys Stegmaier, LCSW 10/19/16

## 2016-11-17 ENCOUNTER — Ambulatory Visit (INDEPENDENT_AMBULATORY_CARE_PROVIDER_SITE_OTHER): Payer: BC Managed Care – PPO | Admitting: Licensed Clinical Social Worker

## 2016-11-17 DIAGNOSIS — F411 Generalized anxiety disorder: Secondary | ICD-10-CM

## 2016-11-18 NOTE — Progress Notes (Signed)
   THERAPIST PROGRESS NOTE  Session Time: 77  Participation Level: Active  Behavioral Response: NeatAlertDepressed  Type of Therapy: Individual Therapy  Treatment Goals addressed: Coping  Interventions: CBT, Motivational Interviewing, Solution Focused, Strength-based and Supportive  Summary: Andrea Roberson is a 31 y.o. female who presents with a depressed/anxious mood.  Therapist met with Patient in an outpatient setting to assess current mood and assist with making progress towards his goals through the use of therapeutic intervention. Therapist did a brief mood check, assessing anger, fear, disgust, excitement, happiness, and sadness. Therapist provided active listening for Patient as she communicated her current stressors (her son current inappropriate behavior and her husband stating that he feels neglected).  Therapist explained the importance of establishing emotional support as well as ensuring that Patient is engaging in adequate self-care during these stress induced times. Therapist then assisted Patient with brainstorming to determine a solution for her current stressors. Therapist and Patient discussed possible solutions such as: exercising, going on a mini vacation and spending quality time with her husband.  Suicidal/Homicidal: No  Therapist Response: LCSW provided Patient with ongoing emotional support and encouragement.  Normalized her feelings.  Commended Patient on her progress and reinforced the importance of client staying focused on her own strengths and resources and resiliency. Processed various strategies for dealing with stressors.    Plan: Return again in 2 weeks.  Diagnosis: Axis I: Generalized Anxiety Disorder    Axis II: No diagnosis    Lubertha South, LCSW 11/18/2016

## 2016-12-07 ENCOUNTER — Other Ambulatory Visit: Payer: Self-pay | Admitting: Psychiatry

## 2016-12-07 NOTE — Telephone Encounter (Signed)
pt called requested a refill on her prozac

## 2016-12-07 NOTE — Telephone Encounter (Signed)
left message on doctor's line to refill prozac 10mg  #90 with no additional refills.

## 2016-12-08 NOTE — Telephone Encounter (Signed)
done

## 2016-12-19 ENCOUNTER — Ambulatory Visit (INDEPENDENT_AMBULATORY_CARE_PROVIDER_SITE_OTHER): Payer: BC Managed Care – PPO | Admitting: Licensed Clinical Social Worker

## 2016-12-19 ENCOUNTER — Encounter: Payer: Self-pay | Admitting: Psychiatry

## 2016-12-19 ENCOUNTER — Ambulatory Visit (INDEPENDENT_AMBULATORY_CARE_PROVIDER_SITE_OTHER): Payer: BC Managed Care – PPO | Admitting: Psychiatry

## 2016-12-19 VITALS — BP 112/77 | HR 82 | Temp 98.3°F | Wt 209.2 lb

## 2016-12-19 DIAGNOSIS — F411 Generalized anxiety disorder: Secondary | ICD-10-CM | POA: Diagnosis not present

## 2016-12-19 NOTE — Progress Notes (Signed)
Patient ID: [name], female   DOB: 05/03/1986, 31 y.o.   MRN: [id number]   Psychiatric Progress note  Patient Identification: CALIANNE LARUE MRN:  [id number] Date of Evaluation:  12/19/2016 Chief Complaint:    Visit Diagnosis:    ICD-9-CM ICD-10-CM   1. Generalized anxiety disorder 300.02 F41.1     History of Present Illness:  Patient is a 31 year old African-American female who presents to the clinic for a follow up of her anxiety and medication management. Reports doing ok. States she has started Phentermine to help with weight loss. States that she is been feeling much better since then. She also reports an increase in her energy and able to sleep better. States that her anxiety also seems to be under control. Reports having had some increased anxiety while active but things have smoothened out. Denies any suicidal thoughts.   Past Psychiatric History: No suicide attempts, no previous hospitalizations.  Previous Psychotropic Medications: Yes   Substance Abuse History in the last 12 months:  No.  Consequences of Substance Abuse: Negative  Past Medical History:  Past Medical History:  Diagnosis Date  . Anxiety   . Bipolar disorder (HCC)   . Depression   . Hyperlipidemia   . Obesity (BMI 35.0-39.9 without comorbidity) 12/24/2015    Past Surgical History:  Procedure Laterality Date  . CESAREAN SECTION    . WISDOM TOOTH EXTRACTION      Family Psychiatric History: Mother and grandmother have depression, have been hospitalized.  Family History:  Family History  Problem Relation Age of Onset  . Heart disease Mother   . Diabetes Mother   . Anxiety disorder Mother   . Heart disease Father   . Diabetes Father   . Colon cancer Paternal Aunt   . Heart disease Maternal Grandmother   . Colon cancer Paternal Grandfather     Social History:   Social History   Social History  . Marital status: Married    Spouse name: N/A  . Number of children: N/A  . Years of  education: N/A   Social History Main Topics  . Smoking status: Former Smoker    Quit date: 09/16/2012  . Smokeless tobacco: Never Used  . Alcohol use No  . Drug use: No  . Sexual activity: Yes    Birth control/ protection: Condom   Other Topics Concern  . Not on file   Social History Narrative  . No narrative on file    Additional Social History: Patient is married and has been married for 3 years and is in a good relationship.  Allergies:  No Known Allergies  Metabolic Disorder Labs: No results found for: HGBA1C, MPG No results found for: PROLACTIN Lab Results  Component Value Date   CHOL 171 12/28/2015   TRIG 400 (H) 12/28/2015   HDL 22 (L) 12/28/2015   LDLCALC 69 12/28/2015     Current Medications: Current Outpatient Prescriptions  Medication Sig Dispense Refill  . FLUoxetine (PROZAC) 10 MG capsule TAKE THREE CAPSULES BY MOUTH DAILY AS DIRECTED 90 capsule 1   No current facility-administered medications for this visit.     Neurologic: Headache: everyday headaches for past 2 months Seizure: No Paresthesias:No  Musculoskeletal: Strength & Muscle Tone: within normal limits Gait & Station: normal Patient leans: N/A  Psychiatric Specialty Exam: ROS  There were no vitals taken for this visit.There is no height or weight on file to calculate BMI.  General Appearance: Casual  Eye Contact:  Fair  Speech:  Clear and Coherent  Volume:  Normal  Mood:  improved  Affect:  normal  Thought Process:  Coherent  Orientation:  Full (Time, Place, and Person)  Thought Content:  normal  Suicidal Thoughts:  No  Homicidal Thoughts:  No  Memory:  Immediate;   Fair Recent;   Fair Remote;   Fair  Judgement:  Fair  Insight:  Fair  Psychomotor Activity:  Normal  Concentration:  Fair  Recall:  [employer]  Fund of Knowledge:Fair  Language: Fair  Akathisia:  No  Handed:  Right  AIMS (if indicated):   Assets:  Communication Skills Desire for Improvement  ADL's:  Intact   Cognition: WNL  Sleep:  good    Treatment Plan Summary: Medication management   Generalized anxiety Disorder  Increase Prozac to 40mg  po qd.  Patient urged to be careful with the weight loss medication phentermine.  Continue to monitor her mood symptoms.  The clinic in 2 month time or call before if necessary.   Patrick NorthAVI, Marcee Jacobs, MD 3/5/20182:58 PM

## 2016-12-22 ENCOUNTER — Encounter: Payer: Self-pay | Admitting: Obstetrics and Gynecology

## 2016-12-22 NOTE — Progress Notes (Signed)
   THERAPIST PROGRESS NOTE  Session Time: 8545  Participation Level: Active  Behavioral Response: NeatAlertDepressed  Type of Therapy: Individual Therapy  Treatment Goals addressed: Coping  Interventions: CBT, Motivational Interviewing, Solution Focused, Strength-based and Supportive  Summary: Andrea Roberson is a 31 y.o. female who presents with a depressed/anxious mood. Patient continues to have anxious feelings daily.  Patient has began going to gym and wants to increase her workouts.  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  Patient has difficulty managing her son's behavior in the home or community.  Patient has preconceived ideas of what her son will do so she avoid community programs. Patient will sign her son up for sports this week.  Patient will continue to build self care techniques  Suicidal/Homicidal: No  Therapist Response: LCSW provided Patient with ongoing emotional support and encouragement.  Normalized her feelings.  Commended Patient on her progress and reinforced the importance of client staying focused on her own strengths and resources and resiliency. Processed various strategies for dealing with stressors.    Plan: Return again in 2 weeks.  Diagnosis: Axis I: Generalized Anxiety Disorder    Axis II: No diagnosis    Marinda Elkicole M Shericka Johnstone, LCSW 12/20/2016

## 2017-01-06 ENCOUNTER — Encounter: Payer: Self-pay | Admitting: Obstetrics and Gynecology

## 2017-01-24 ENCOUNTER — Ambulatory Visit (INDEPENDENT_AMBULATORY_CARE_PROVIDER_SITE_OTHER): Payer: BC Managed Care – PPO | Admitting: Psychiatry

## 2017-01-24 ENCOUNTER — Encounter: Payer: Self-pay | Admitting: Psychiatry

## 2017-01-24 ENCOUNTER — Ambulatory Visit (INDEPENDENT_AMBULATORY_CARE_PROVIDER_SITE_OTHER): Payer: BC Managed Care – PPO | Admitting: Licensed Clinical Social Worker

## 2017-01-24 VITALS — BP 124/79 | HR 68 | Temp 97.9°F | Wt 202.4 lb

## 2017-01-24 DIAGNOSIS — F411 Generalized anxiety disorder: Secondary | ICD-10-CM

## 2017-01-24 NOTE — Progress Notes (Signed)
Patient ID: Andrea Roberson, female   DOB: 1986-06-11, 31 y.o.   MRN: [id number]   Psychiatric Progress note  Patient Identification: LESLEE SUIRE MRN:  [id number] Date of Evaluation:  01/24/2017 Chief Complaint:  Increased anxiety Chief Complaint    Medication Refill; Follow-up     Visit Diagnosis:    ICD-9-CM ICD-10-CM   1. Generalized anxiety disorder 300.02 F41.1     History of Present Illness:  Patient is a 31 year old African-American female who presents to the clinic for a follow up of her anxiety and medication management. Tolerating the prozac well at . However she has been more anxious over past 2 weeks. Her mother has been sick with a stent problem and other health issues. Doing well at school. Fair sleep and appetite.    Past Psychiatric History: No suicide attempts, no previous hospitalizations.  Previous Psychotropic Medications: Yes   Substance Abuse History in the last 12 months:  No.  Consequences of Substance Abuse: Negative  Past Medical History:  Past Medical History:  Diagnosis Date  . Anxiety   . Bipolar disorder (HCC)   . Depression   . Hyperlipidemia   . Obesity (BMI 35.0-39.9 without comorbidity) 12/24/2015    Past Surgical History:  Procedure Laterality Date  . CESAREAN SECTION    . WISDOM TOOTH EXTRACTION      Family Psychiatric History: Mother and grandmother have depression, have been hospitalized.  Family History:  Family History  Problem Relation Age of Onset  . Heart disease Mother   . Diabetes Mother   . Anxiety disorder Mother   . Heart disease Father   . Diabetes Father   . Colon cancer Paternal Aunt   . Heart disease Maternal Grandmother   . Colon cancer Paternal Grandfather     Social History:   Social History   Social History  . Marital status: Married    Spouse name: N/A  . Number of children: N/A  . Years of education: N/A   Social History Main Topics  . Smoking status: Former Smoker    Quit date:  09/16/2012  . Smokeless tobacco: Never Used  . Alcohol use No  . Drug use: No  . Sexual activity: Yes    Birth control/ protection: Condom   Other Topics Concern  . None   Social History Narrative  . None    Additional Social History: Patient is married and has been married for 3 years and is in a good relationship.  Allergies:  No Known Allergies  Metabolic Disorder Labs: No results found for: HGBA1C, MPG No results found for: PROLACTIN Lab Results  Component Value Date   CHOL 171 12/28/2015   TRIG 400 (H) 12/28/2015   HDL 22 (L) 12/28/2015   LDLCALC 69 12/28/2015     Current Medications: Current Outpatient Prescriptions  Medication Sig Dispense Refill  . FLUoxetine (PROZAC) 10 MG capsule TAKE THREE CAPSULES BY MOUTH DAILY AS DIRECTED 90 capsule 1   No current facility-administered medications for this visit.     Neurologic: Headache: everyday headaches for past 2 months Seizure: No Paresthesias:No  Musculoskeletal: Strength & Muscle Tone: within normal limits Gait & Station: normal Patient leans: N/A  Psychiatric Specialty Exam: ROS  Blood pressure 124/79, pulse 68, temperature 97.9 F (36.6 C), temperature source Oral, weight 202 lb 6.4 oz (91.8 kg), last menstrual period 01/10/2017.Body mass index is 39.53 kg/m.  General Appearance: Casual  Eye Contact:  Fair  Speech:  Clear and Coherent  Volume:  Normal  Mood:  improved  Affect:  Increased anxiety  Thought Process:  Coherent  Orientation:  Full (Time, Place, and Person)  Thought Content:  normal  Suicidal Thoughts:  No  Homicidal Thoughts:  No  Memory:  Immediate;   Fair Recent;   Fair Remote;   Fair  Judgement:  Fair  Insight:  Fair  Psychomotor Activity:  Normal  Concentration:  Fair  Recall:  [employer] of Knowledge:Fair  Language: Fair  Akathisia:  No  Handed:  Right  AIMS (if indicated):   Assets:  Communication Skills Desire for Improvement  ADL's:  Intact  Cognition: WNL   Sleep:  good    Treatment Plan Summary: Medication management   Generalized anxiety Disorder  Continue Prozac to  po qd.  Patient urged to be careful with the weight loss medication phentermine.  Continue to monitor her mood symptoms.  The clinic in 2 month time or call before if necessary.   Patrick North, MD 4/10/20183:44 PM

## 2017-01-31 NOTE — Progress Notes (Signed)
   THERAPIST PROGRESS NOTE  Session Time: 67  Participation Level: Active  Behavioral Response: NeatAlertDepressed  Type of Therapy: Individual Therapy  Treatment Goals addressed: Coping  Interventions: CBT, Motivational Interviewing, Solution Focused, Strength-based and Supportive  Summary: Andrea Roberson is a 30 y.o. female who presents with a depressed/anxious mood. Patient reports slight relief during her vacation.  Patient continues to learn coping skills.  Patient and her husband are exercising and able to get out of the home.  Patient reports that thinking about her parents illnesses are overwhelming and her finances are an issue in her home.  Assisted with financial budgeting and how to save more.   Suicidal/Homicidal: No  Therapist Response: LCSW provided Patient with ongoing emotional support and encouragement.  Normalized her feelings.  Commended Patient on her progress and reinforced the importance of client staying focused on her own strengths and resources and resiliency. Processed various strategies for dealing with stressors.    Plan: Return again in 2 weeks.  Diagnosis: Axis I: Generalized Anxiety Disorder    Axis II: No diagnosis    [name], LCSW 01/26/2017

## 2017-03-02 ENCOUNTER — Ambulatory Visit (INDEPENDENT_AMBULATORY_CARE_PROVIDER_SITE_OTHER): Payer: BC Managed Care – PPO | Admitting: Licensed Clinical Social Worker

## 2017-03-02 DIAGNOSIS — F411 Generalized anxiety disorder: Secondary | ICD-10-CM | POA: Diagnosis not present

## 2017-03-07 NOTE — Progress Notes (Signed)
   THERAPIST PROGRESS NOTE  Session Time: 2760  Participation Level: Active  Behavioral Response: NeatAlertDepressed  Type of Therapy: Individual Therapy  Treatment Goals addressed: Coping  Interventions: CBT, Motivational Interviewing, Solution Focused, Strength-based and Supportive  Summary: Andrea Roberson is a 31 y.o. female who presents with a depressed/anxious mood. Patient reports that her mood has "been better." She reports frustration with her son's daycare provider.  She reports that she will take him out of daycare during the summer to save money.  She reports that she wants to locate a new daycare for him to eliminate stress.  She reports that she is using coping skills which has reduced her anxiety.  Patient reports that she wants to change Psychiatrist.  Suicidal/Homicidal: No  Therapist Response: LCSW provided Patient with ongoing emotional support and encouragement.  Normalized her feelings.  Commended Patient on her progress and reinforced the importance of client staying focused on her own strengths and resources and resiliency. Processed various strategies for dealing with stressors.    Plan: Return again in 2 weeks.  Diagnosis: Axis I: Generalized Anxiety Disorder    Axis II: No diagnosis    Andrea Elkicole M Jeremiah Curci, LCSW 03/03/2017

## 2017-04-04 ENCOUNTER — Ambulatory Visit (INDEPENDENT_AMBULATORY_CARE_PROVIDER_SITE_OTHER): Payer: BC Managed Care – PPO | Admitting: Licensed Clinical Social Worker

## 2017-04-04 ENCOUNTER — Ambulatory Visit (INDEPENDENT_AMBULATORY_CARE_PROVIDER_SITE_OTHER): Payer: BC Managed Care – PPO | Admitting: Psychiatry

## 2017-04-04 ENCOUNTER — Encounter: Payer: Self-pay | Admitting: Psychiatry

## 2017-04-04 VITALS — BP 119/77 | HR 94 | Temp 98.6°F | Wt 192.8 lb

## 2017-04-04 DIAGNOSIS — F411 Generalized anxiety disorder: Secondary | ICD-10-CM | POA: Diagnosis not present

## 2017-04-04 DIAGNOSIS — F331 Major depressive disorder, recurrent, moderate: Secondary | ICD-10-CM | POA: Diagnosis not present

## 2017-04-04 MED ORDER — FLUOXETINE HCL 20 MG PO CAPS: 60.0000 mg | ORAL_CAPSULE | Freq: Every day | ORAL | 1 refills | Status: DC

## 2017-04-04 MED ORDER — TRAZODONE HCL 50 MG PO TABS: 50.0000 mg | ORAL_TABLET | Freq: Every day | ORAL | 1 refills | Status: DC

## 2017-04-04 NOTE — Progress Notes (Signed)
Patient ID: Andrea Roberson, female   DOB: 08-27-1986, 31 y.o.   MRN: [id number]   Psychiatric Progress note  Patient Identification: HILDAGARD SOBECKI MRN:  [id number] Date of Evaluation:  04/04/2017 Chief Complaint:  Increased anxiety Chief Complaint    Follow-up; Medication Refill     Visit Diagnosis:    ICD-10-CM   1. Generalized anxiety disorder F41.1   2. MDD (major depressive disorder), recurrent episode, moderate (HCC) F33.1     History of Present Illness:  Patient is a 31 year old African-American female who presents to the clinic for a follow up of her anxiety and medication management. Patient reports that she has not been doing well lately. Continues to feel anxious all the time. Denies any triggers. 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 States that the only thing she needs to do is manage her toddler son and make lunch for her husband. States that down she is unsure of why she feels so anxious. Denies any suicidal thoughts. She has been feeling depressed and feeling bad about herself and thinking she is a failure. Fair sleep and appetite.    Past Psychiatric History: No suicide attempts, no previous hospitalizations.  Previous Psychotropic Medications: Yes   Substance Abuse History in the last 12 months:  No.  Consequences of Substance Abuse: Negative  Past Medical History:  Past Medical History:  Diagnosis Date  . Anxiety   . Bipolar disorder (HCC)   . Depression   . Hyperlipidemia   . Obesity (BMI 35.0-39.9 without comorbidity) 12/24/2015    Past Surgical History:  Procedure Laterality Date  . CESAREAN SECTION    . WISDOM TOOTH EXTRACTION      Family Psychiatric History: Mother and grandmother have depression, have been hospitalized.  Family History:  Family History  Problem Relation Age of Onset  . Heart disease Mother   . Diabetes Mother   . Anxiety disorder Mother   . Heart disease Father   . Diabetes Father    . Colon cancer Paternal Aunt   . Heart disease Maternal Grandmother   . Colon cancer Paternal Grandfather     Social History:   Social History   Social History  . Marital status: Married    Spouse name: N/A  . Number of children: N/A  . Years of education: N/A   Social History Main Topics  . Smoking status: Former Smoker    Quit date: 09/16/2012  . Smokeless tobacco: Never Used  . Alcohol use No  . Drug use: No  . Sexual activity: Yes    Birth control/ protection: Condom   Other Topics Concern  . None   Social History Narrative  . None    Additional Social History: Patient is married and has been married for 3 years and is in a good relationship.  Allergies:  No Known Allergies  Metabolic Disorder Labs: No results found for: HGBA1C, MPG No results found for: PROLACTIN Lab Results  Component Value Date   CHOL 171 12/28/2015   TRIG 400 (H) 12/28/2015   HDL 22 (L) 12/28/2015   LDLCALC 69 12/28/2015     Current Medications: Current Outpatient Prescriptions  Medication Sig Dispense Refill  . FLUoxetine (PROZAC) 20 MG capsule Take 3 capsules (60 mg total) by mouth daily. 90 capsule 1  . traZODone (DESYREL) 50 MG tablet Take 1 tablet (50 mg total) by mouth at bedtime. 30 tablet 1   No current facility-administered medications for  this visit.     Neurologic: Headache: everyday headaches for past 2 months Seizure: No Paresthesias:No  Musculoskeletal: Strength & Muscle Tone: within normal limits Gait & Station: normal Patient leans: N/A  Psychiatric Specialty Exam: ROS  Blood pressure 119/77, pulse 94, temperature 98.6 F (37 C), temperature source Oral, weight 192 lb 12.8 oz (87.5 kg), last menstrual period 03/04/2017.Body mass index is 37.65 kg/m.  General Appearance: Casual  Eye Contact:  Fair  Speech:  Clear and Coherent  Volume:  Normal  Mood:  depressed  Affect:  Increased anxiety  Thought Process:  Coherent  Orientation:  Full (Time, Place,  and Person)  Thought Content:  normal  Suicidal Thoughts:  No  Homicidal Thoughts:  No  Memory:  Immediate;   Fair Recent;   Fair Remote;   Fair  Judgement:  Fair  Insight:  Fair  Psychomotor Activity:  Normal  Concentration:  Fair  Recall:  [employer]  Fund of Knowledge:Fair  Language: Fair  Akathisia:  No  Handed:  Right  AIMS (if indicated):   Assets:  Communication Skills Desire for Improvement  ADL's:  Intact  Cognition: WNL  Sleep:  poor    Treatment Plan Summary: Medication management   Generalized anxiety Disorder  Increase Prozac to 60mg  po qd.  Continue Therapy with Nolon RodNicole Peacock.  Insomnia Start trazodone at 50 mg at bedtime  Continue to monitor her mood symptoms.  The clinic in 1 month time or call before if necessary.   Patrick NorthAVI, Raynie Steinhaus, MD 6/19/201812:06 PM

## 2017-04-07 NOTE — Progress Notes (Signed)
   THERAPIST PROGRESS NOTE  Session Time: 1460  Participation Level: Active  Behavioral Response: NeatAlertDepressed  Type of Therapy: Individual Therapy  Treatment Goals addressed: Coping  Interventions: CBT, Motivational Interviewing, Solution Focused, Strength-based and Supportive  Summary: Andrea Roberson is a 31 y.o. female who presents with a depressed/anxious mood. Patient reports that her mood is "worse".  She reports that has not felt well for the past few weeks.  She denies using effective coping skills.  She denies being able to acknowledge her triggers.  Explored with Patient mindfulness.  Allowed patient time to view how to use mindfulness while taking a walking.  Discussion of deep breathing activities and how that can assist with calming her down. Assisted patient with using problem solving.  Discussion of her routine and the lack of a routine and how that may have an effect on her irrational thoughts.  Suicidal/Homicidal: No  Therapist Response: LCSW provided Patient with ongoing emotional support and encouragement.  Normalized her feelings.  Commended Patient on her progress and reinforced the importance of client staying focused on her own strengths and resources and resiliency. Processed various strategies for dealing with stressors.    Plan: Return again in 2 weeks.  Diagnosis: Axis I: Generalized Anxiety Disorder    Axis II: No diagnosis    Andrea Elkicole M Tressa Maldonado, LCSW 04/07/2017

## 2017-04-12 ENCOUNTER — Encounter: Payer: Self-pay | Admitting: Obstetrics and Gynecology

## 2017-04-12 ENCOUNTER — Ambulatory Visit (INDEPENDENT_AMBULATORY_CARE_PROVIDER_SITE_OTHER): Payer: Self-pay | Admitting: Obstetrics and Gynecology

## 2017-04-12 VITALS — BP 110/74 | HR 84 | Ht 65.0 in | Wt 190.3 lb

## 2017-04-12 DIAGNOSIS — E785 Hyperlipidemia, unspecified: Secondary | ICD-10-CM

## 2017-04-12 DIAGNOSIS — Z8632 Personal history of gestational diabetes: Secondary | ICD-10-CM

## 2017-04-12 DIAGNOSIS — N941 Unspecified dyspareunia: Secondary | ICD-10-CM

## 2017-04-12 DIAGNOSIS — Z01419 Encounter for gynecological examination (general) (routine) without abnormal findings: Secondary | ICD-10-CM

## 2017-04-12 DIAGNOSIS — N946 Dysmenorrhea, unspecified: Secondary | ICD-10-CM

## 2017-04-12 DIAGNOSIS — Z7689 Persons encountering health services in other specified circumstances: Secondary | ICD-10-CM

## 2017-04-12 DIAGNOSIS — Z124 Encounter for screening for malignant neoplasm of cervix: Secondary | ICD-10-CM

## 2017-04-12 NOTE — Progress Notes (Signed)
GYNECOLOGY ANNUAL PHYSICAL EXAM PROGRESS NOTE  Subjective:    Andrea Roberson is a 31 y.o. P80 married female who presents for an annual exam.The patient is sexually active.  The patient wears seatbelts: yes. The patient participates in regular exercise: yes (2-3 times weekly). Has the patient ever been transfused or tattooed?: no. The patient reports that there is not domestic violence in her life.   The patient has the following complaints today:  1. Notes period in May was abnormal, heavier, with clotting with more "chunky tissue", and more painful. Unsure if this could have been a miscarriage. Did not take a pregnancy test.   Gynecologic History Patient's last menstrual period was 04/06/2017.   Menarche age: 52.  Reports regular menses.  Contraception: condoms (intermittent use).   History of STI's: Denies Last Pap: 04/2014. Results were: normal.  Denies h/o abnormal pap smears.   Obstetric History   G1   P1   T1   P0   A0   L1    SAB0   TAB0   Ectopic0   Multiple0   Live Births1     # Outcome Date GA Lbr Len/2nd Weight Sex Delivery Anes PTL Lv  1 Term 05/02/14 [redacted]w[redacted]d  7 lb 8 oz (3.402 kg) M CS-LTranv Spinal, EPI N LIV     Name: [name]     Complications: Gestational diabetes mellitus in childbirth, insulin controlled,Excessive weight gain in pregnancy     Apgar1:  8                Apgar5: 9      Past Medical History:  Diagnosis Date  . Anxiety   . Bipolar disorder (HCC)   . Depression   . History of gestational diabetes 2016  . Hyperlipidemia   . Obesity (BMI 35.0-39.9 without comorbidity) 12/24/2015     Past Surgical History:  Procedure Laterality Date  . CESAREAN SECTION    . WISDOM TOOTH EXTRACTION      Family History  Problem Relation Age of Onset  . Heart disease Mother   . Diabetes Mother   . Anxiety disorder Mother   . Heart disease Father   . Diabetes Father   . Skin cancer Father   . Colon cancer Paternal Aunt   . Heart disease Maternal  Grandmother   . Colon cancer Paternal Grandfather     Social History   Social History  . Marital status: Married    Spouse name: N/A  . Number of children: N/A  . Years of education: N/A   Occupational History  . Not on file.   Social History Main Topics  . Smoking status: Former Smoker    Quit date: 09/16/2012  . Smokeless tobacco: Never Used  . Alcohol use No  . Drug use: No  . Sexual activity: Yes    Birth control/ protection: None   Other Topics Concern  . Not on file   Social History Narrative  . No narrative on file    Current Outpatient Prescriptions on File Prior to Visit  Medication Sig Dispense Refill  . FLUoxetine (PROZAC) 20 MG capsule Take 3 capsules (60 mg total) by mouth daily. 90 capsule 1  . traZODone (DESYREL) 50 MG tablet Take 1 tablet (50 mg total) by mouth at bedtime. 30 tablet 1   No current facility-administered medications on file prior to visit.     No Known Allergies   Review of Systems Constitutional: negative for chills, fatigue, fevers and  sweats. Positive for intentional weight loss (35 lbs) Eyes: negative for irritation, redness and visual disturbance Ears, nose, mouth, throat, and face: negative for hearing loss, nasal congestion, snoring and tinnitus Respiratory: negative for asthma, cough, sputum Cardiovascular: negative for chest pain, dyspnea, exertional chest pressure/discomfort, irregular heart beat, palpitations and syncope Gastrointestinal: negative for abdominal pain, change in bowel habits, nausea and vomiting Genitourinary: negative for abnormal menstrual periods, genital lesions, sexual problems and vaginal discharge, dysuria and urinary incontinence.  Positive for dysmenorrhea, mild dyspareunia and abnormal menses in May.  Integument/breast: negative for breast lump, breast tenderness and nipple discharge Hematologic/lymphatic: negative for bleeding and easy bruising Musculoskeletal:negative for back pain and muscle  weakness Neurological: negative for dizziness, headaches, vertigo and weakness Endocrine: negative for diabetic symptoms including polydipsia, polyuria and skin dryness Allergic/Immunologic: negative for hay fever and urticaria       Objective:  Blood pressure 110/74, pulse 84, height 5\' 5"  (1.651 m), weight 190 lb 4.8 oz (86.3 kg), last menstrual period 04/06/2017. Body mass index is 31.67 kg/m.  General Appearance:    Alert, cooperative, no distress, appears stated age; mildly obese  Head:    Normocephalic, without obvious abnormality, atraumatic  Eyes:    PERRL, conjunctiva/corneas clear, EOM's intact, both eyes  Ears:    Normal external ear canals, both ears  Nose:   Nares normal, septum midline, mucosa normal, no drainage or sinus tenderness  Throat:   Lips, mucosa, and tongue normal; teeth and gums normal  Neck:   Supple, symmetrical, trachea midline, no adenopathy; thyroid: no enlargement/tenderness/nodules; no carotid bruit or JVD  Back:     Symmetric, no curvature, ROM normal, no CVA tenderness  Lungs:     Clear to auscultation bilaterally, respirations unlabored  Chest Wall:    No tenderness or deformity   Heart:    Regular rate and rhythm, S1 and S2 normal, no murmur, rub or gallop  Breast Exam:    No tenderness, masses, or nipple abnormality  Abdomen:     Soft, non-tender, bowel sounds active all four quadrants, no masses, no organomegaly.  Well-healed Pfannenstiel incision  Genitalia:    Pelvic:external genitalia normal, vagina without lesions, discharge, or tenderness, rectovaginal septum  normal. Cervix normal in appearance, no cervical motion tenderness, no adnexal masses or tenderness.  Uterus normal size, shape, mobile, regular contours, nontender.  Rectal:    Normal external sphincter.  No hemorrhoids appreciated. Internal exam not done.   Extremities:   Extremities normal, atraumatic, no cyanosis or edema  Pulses:   2+ and symmetric all extremities  Skin:   Skin color,  texture, turgor normal, no rashes or lesions  Lymph nodes:   Cervical, supraclavicular, and axillary nodes normal  Neurologic:   CNII-XII intact, normal strength, sensation and reflexes throughout   Labs:  Lab Results  Component Value Date   WBC 9.0 12/28/2015   HGB 12.3 12/28/2015   HCT 36.6 12/28/2015   MCV 83 12/28/2015   PLT 300 12/28/2015    Lab Results  Component Value Date   CREATININE 0.67 12/28/2015   BUN 10 12/28/2015   NA 139 12/28/2015   K 4.0 12/28/2015   CL 101 12/28/2015   CO2 21 12/28/2015   Lab Results  Component Value Date   ALT 18 12/28/2015   AST 21 12/28/2015   ALKPHOS 105 12/28/2015   BILITOT <0.2 12/28/2015    Lab Results  Component Value Date   CHOL 171 12/28/2015   HDL 22 (L) 12/28/2015  LDLCALC 69 12/28/2015   TRIG 400 (H) 12/28/2015    Assessment:    Healthy female exam.  Obesity Class II Contraception management Dyslipidemia History of GDM Dysmenorrhea Dysparenuia (mild)  Plan:  Labs: CBC, CMP, and Lipid panel, HgbA1c Pap smear performed today.  Breast self exam technique reviewed and patient encouraged to perform self-exam monthly. Needs PCP, will refer.  Contraception: condoms currently but inconsistent use. Education given regarding options for contraception, including barrier methods, injectable contraception, IUD placement, oral contraceptives, NuvaRing, and contraceptive patch. Patient would like to consider Nuvaring.  Sample given. To do Sunday start after next menses. To call office if prescription desired.  Discussed healthy lifestyle modifications.  Notes that she has been exercising and dieting, has lost 8 lbs.  Continued to encourage weight management.  Dyslipidemia, will recheck today. Would recommend beginning fish-oil supplements, low-cholesterol diet, and exercise.  H/o GDM, will screen with HgbA1c as patient not fasting today. Recommend yearly screening for diabetes.  Dysmenorrhea, progressively worsening.   Discussed NSIADs for management, patient notes Aleve of Pamprin usually helps.  Dyspareunia, mild to moderate, mostly with deep penetration.  Discussed potential causes.  Advised on alternating positions, using a different lubricant.  If there is a possibility of underlying endometriosis, once patient treats with NuvaRing, this should help  Follow up in 1 year.      Hildred Laser, MD [facility]

## 2017-04-12 NOTE — Patient Instructions (Signed)
Health Maintenance, Female Adopting a healthy lifestyle and getting preventive care can go a long way to promote health and wellness. Talk with your health care provider about what schedule of regular examinations is right for you. This is a good chance for you to check in with your provider about disease prevention and staying healthy. In between checkups, there are plenty of things you can do on your own. Experts have done a lot of research about which lifestyle changes and preventive measures are most likely to keep you healthy. Ask your health care provider for more information. Weight and diet Eat a healthy diet  Be sure to include plenty of vegetables, fruits, low-fat dairy products, and lean protein.  Do not eat a lot of foods high in solid fats, added sugars, or salt.  Get regular exercise. This is one of the most important things you can do for your health. ? Most adults should exercise for at least 150 minutes each week. The exercise should increase your heart rate and make you sweat (moderate-intensity exercise). ? Most adults should also do strengthening exercises at least twice a week. This is in addition to the moderate-intensity exercise.  Maintain a healthy weight  Body mass index (BMI) is a measurement that can be used to identify possible weight problems. It estimates body fat based on height and weight. Your health care provider can help determine your BMI and help you achieve or maintain a healthy weight.  For females 20 years of age and older: ? A BMI below 18.5 is considered underweight. ? A BMI of 18.5 to 24.9 is normal. ? A BMI of 25 to 29.9 is considered overweight. ? A BMI of 30 and above is considered obese.  Watch levels of cholesterol and blood lipids  You should start having your blood tested for lipids and cholesterol at 31 years of age, then have this test every 5 years.  You may need to have your cholesterol levels checked more often if: ? Your lipid or  cholesterol levels are high. ? You are older than 31 years of age. ? You are at high risk for heart disease.  Cancer screening Lung Cancer  Lung cancer screening is recommended for adults 55-80 years old who are at high risk for lung cancer because of a history of smoking.  A yearly low-dose CT scan of the lungs is recommended for people who: ? Currently smoke. ? Have quit within the past 15 years. ? Have at least a 30-pack-year history of smoking. A pack year is smoking an average of one pack of cigarettes a day for 1 year.  Yearly screening should continue until it has been 15 years since you quit.  Yearly screening should stop if you develop a health problem that would prevent you from having lung cancer treatment.  Breast Cancer  Practice breast self-awareness. This means understanding how your breasts normally appear and feel.  It also means doing regular breast self-exams. Let your health care provider know about any changes, no matter how small.  If you are in your 20s or 30s, you should have a clinical breast exam (CBE) by a health care provider every 1-3 years as part of a regular health exam.  If you are 40 or older, have a CBE every year. Also consider having a breast X-ray (mammogram) every year.  If you have a family history of breast cancer, talk to your health care provider about genetic screening.  If you are at high risk   for breast cancer, talk to your health care provider about having an MRI and a mammogram every year.  Breast cancer gene (BRCA) assessment is recommended for women who have family members with BRCA-related cancers. BRCA-related cancers include: ? Breast. ? Ovarian. ? Tubal. ? Peritoneal cancers.  Results of the assessment will determine the need for genetic counseling and BRCA1 and BRCA2 testing.  Cervical Cancer Your health care provider may recommend that you be screened regularly for cancer of the pelvic organs (ovaries, uterus, and  vagina). This screening involves a pelvic examination, including checking for microscopic changes to the surface of your cervix (Pap test). You may be encouraged to have this screening done every 3 years, beginning at age 22.  For women ages 56-65, health care providers may recommend pelvic exams and Pap testing every 3 years, or they may recommend the Pap and pelvic exam, combined with testing for human papilloma virus (HPV), every 5 years. Some types of HPV increase your risk of cervical cancer. Testing for HPV may also be done on women of any age with unclear Pap test results.  Other health care providers may not recommend any screening for nonpregnant women who are considered low risk for pelvic cancer and who do not have symptoms. Ask your health care provider if a screening pelvic exam is right for you.  If you have had past treatment for cervical cancer or a condition that could lead to cancer, you need Pap tests and screening for cancer for at least 20 years after your treatment. If Pap tests have been discontinued, your risk factors (such as having a new sexual partner) need to be reassessed to determine if screening should resume. Some women have medical problems that increase the chance of getting cervical cancer. In these cases, your health care provider may recommend more frequent screening and Pap tests.  Colorectal Cancer  This type of cancer can be detected and often prevented.  Routine colorectal cancer screening usually begins at 31 years of age and continues through 31 years of age.  Your health care provider may recommend screening at an earlier age if you have risk factors for colon cancer.  Your health care provider may also recommend using home test kits to check for hidden blood in the stool.  A small camera at the end of a tube can be used to examine your colon directly (sigmoidoscopy or colonoscopy). This is done to check for the earliest forms of colorectal  cancer.  Routine screening usually begins at age 33.  Direct examination of the colon should be repeated every 5-10 years through 31 years of age. However, you may need to be screened more often if early forms of precancerous polyps or small growths are found.  Skin Cancer  Check your skin from head to toe regularly.  Tell your health care provider about any new moles or changes in moles, especially if there is a change in a mole's shape or color.  Also tell your health care provider if you have a mole that is larger than the size of a pencil eraser.  Always use sunscreen. Apply sunscreen liberally and repeatedly throughout the day.  Protect yourself by wearing long sleeves, pants, a wide-brimmed hat, and sunglasses whenever you are outside.  Heart disease, diabetes, and high blood pressure  High blood pressure causes heart disease and increases the risk of stroke. High blood pressure is more likely to develop in: ? People who have blood pressure in the high end of  the normal range (130-139/85-89 mm Hg). ? People who are overweight or obese. ? People who are African American.  If you are 21-29 years of age, have your blood pressure checked every 3-5 years. If you are 3 years of age or older, have your blood pressure checked every year. You should have your blood pressure measured twice-once when you are at a hospital or clinic, and once when you are not at a hospital or clinic. Record the average of the two measurements. To check your blood pressure when you are not at a hospital or clinic, you can use: ? An automated blood pressure machine at a pharmacy. ? A home blood pressure monitor.  If you are between 17 years and 37 years old, ask your health care provider if you should take aspirin to prevent strokes.  Have regular diabetes screenings. This involves taking a blood sample to check your fasting blood sugar level. ? If you are at a normal weight and have a low risk for diabetes,  have this test once every three years after 31 years of age. ? If you are overweight and have a high risk for diabetes, consider being tested at a younger age or more often. Preventing infection Hepatitis B  If you have a higher risk for hepatitis B, you should be screened for this virus. You are considered at high risk for hepatitis B if: ? You were born in a country where hepatitis B is common. Ask your health care provider which countries are considered high risk. ? Your parents were born in a high-risk country, and you have not been immunized against hepatitis B (hepatitis B vaccine). ? You have HIV or AIDS. ? You use needles to inject street drugs. ? You live with someone who has hepatitis B. ? You have had sex with someone who has hepatitis B. ? You get hemodialysis treatment. ? You take certain medicines for conditions, including cancer, organ transplantation, and autoimmune conditions.  Hepatitis C  Blood testing is recommended for: ? Everyone born from [birth year] through 1965. ? Anyone with known risk factors for hepatitis C.  Sexually transmitted infections (STIs)  You should be screened for sexually transmitted infections (STIs) including gonorrhea and chlamydia if: ? You are sexually active and are younger than 31 years of age. ? You are older than 31 years of age and your health care provider tells you that you are at risk for this type of infection. ? Your sexual activity has changed since you were last screened and you are at an increased risk for chlamydia or gonorrhea. Ask your health care provider if you are at risk.  If you do not have HIV, but are at risk, it may be recommended that you take a prescription medicine daily to prevent HIV infection. This is called pre-exposure prophylaxis (PrEP). You are considered at risk if: ? You are sexually active and do not regularly use condoms or know the HIV status of your partner(s). ? You take drugs by injection. ? You are  sexually active with a partner who has HIV.  Talk with your health care provider about whether you are at high risk of being infected with HIV. If you choose to begin PrEP, you should first be tested for HIV. You should then be tested every 3 months for as long as you are taking PrEP. Pregnancy  If you are premenopausal and you may become pregnant, ask your health care provider about preconception counseling.  If you may become  pregnant, take 400 to 800 micrograms (mcg) of folic acid every day.  If you want to prevent pregnancy, talk to your health care provider about birth control (contraception). Osteoporosis and menopause  Osteoporosis is a disease in which the bones lose minerals and strength with aging. This can result in serious bone fractures. Your risk for osteoporosis can be identified using a bone density scan.  If you are 38 years of age or older, or if you are at risk for osteoporosis and fractures, ask your health care provider if you should be screened.  Ask your health care provider whether you should take a calcium or vitamin D supplement to lower your risk for osteoporosis.  Menopause may have certain physical symptoms and risks.  Hormone replacement therapy may reduce some of these symptoms and risks. Talk to your health care provider about whether hormone replacement therapy is right for you. Follow these instructions at home:  Schedule regular health, dental, and eye exams.  Stay current with your immunizations.  Do not use any tobacco products including cigarettes, chewing tobacco, or electronic cigarettes.  If you are pregnant, do not drink alcohol.  If you are breastfeeding, limit how much and how often you drink alcohol.  Limit alcohol intake to no more than 1 drink per day for nonpregnant women. One drink equals 12 ounces of beer, 5 ounces of wine, or 1 ounces of hard liquor.  Do not use street drugs.  Do not share needles.  Ask your health care  provider for help if you need support or information about quitting drugs.  Tell your health care provider if you often feel depressed.  Tell your health care provider if you have ever been abused or do not feel safe at home. This information is not intended to replace advice given to you by your health care provider. Make sure you discuss any questions you have with your health care provider. Document Released: 04/18/2011 Document Revised: 03/10/2016 Document Reviewed: 07/07/2015 Elsevier Interactive Patient Education  2018 [employer].   Ethinyl Estradiol; Etonogestrel vaginal ring What is this medicine? ETHINYL ESTRADIOL; ETONOGESTREL (ETH in il es tra DYE ole; et oh noe JES trel) vaginal ring is a flexible, vaginal ring used as a contraceptive (birth control method). This medicine combines two types of female hormones, an estrogen and a progestin. This ring is used to prevent ovulation and pregnancy. Each ring is effective for one month. This medicine may be used for other purposes; ask your health care provider or pharmacist if you have questions. COMMON BRAND NAME(S): NuvaRing What should I tell my health care provider before I take this medicine? They need to know if you have or ever had any of these conditions: -abnormal vaginal bleeding -blood vessel disease or blood clots -breast, cervical, endometrial, ovarian, liver, or uterine cancer -diabetes -gallbladder disease -heart disease or recent heart attack -high blood pressure -high cholesterol -kidney disease -liver disease -migraine headaches -stroke -systemic lupus erythematosus (SLE) -tobacco smoker -an unusual or allergic reaction to estrogens, progestins, other medicines, foods, dyes, or preservatives -pregnant or trying to get pregnant -breast-feeding How should I use this medicine? Insert the ring into your vagina as directed. Follow the directions on the prescription label. The ring will remain place for 3 weeks  and is then removed for a 1-week break. A new ring is inserted 1 week after the last ring was removed, on the same day of the week. Check often to make sure the ring is still in place,  especially before and after sexual intercourse. If the ring was out of the vagina for an unknown amount of time, you may not be protected from pregnancy. Perform a pregnancy test and call your doctor. Do not use more often than directed. A patient package insert for the product will be given with each prescription and refill. Read this sheet carefully each time. The sheet may change frequently. Contact your pediatrician regarding the use of this medicine in children. Special care may be needed. This medicine has been used in female children who have started having menstrual periods. Overdosage: If you think you have taken too much of this medicine contact a poison control center or emergency room at once. NOTE: This medicine is only for you. Do not share this medicine with others. What if I miss a dose? You will need to replace your vaginal ring once a month as directed. If the ring should slip out, or if you leave it in longer or shorter than you should, contact your health care professional for advice. What may interact with this medicine? Do not take this medicine with the following medication: -dasabuvir; ombitasvir; paritaprevir; ritonavir -ombitasvir; paritaprevir; ritonavir This medicine may also interact with the following medications: -acetaminophen -antibiotics or medicines for infections, especially rifampin, rifabutin, rifapentine, and griseofulvin, and possibly penicillins or tetracyclines -aprepitant -ascorbic acid (vitamin C) -atorvastatin -barbiturate medicines, such as phenobarbital -bosentan -carbamazepine -caffeine -clofibrate -cyclosporine -dantrolene -doxercalciferol -felbamate -grapefruit juice -hydrocortisone -medicines for anxiety or sleeping problems, such as diazepam or  temazepam -medicines for diabetes, including pioglitazone -modafinil -mycophenolate -nefazodone -oxcarbazepine -phenytoin -prednisolone -ritonavir or other medicines for HIV infection or AIDS -rosuvastatin -selegiline -soy isoflavones supplements -St. John's wort -tamoxifen or raloxifene -theophylline -thyroid hormones -topiramate -warfarin This list may not describe all possible interactions. Give your health care provider a list of all the medicines, herbs, non-prescription drugs, or dietary supplements you use. Also tell them if you smoke, drink alcohol, or use illegal drugs. Some items may interact with your medicine. What should I watch for while using this medicine? Visit your doctor or health care professional for regular checks on your progress. You will need a regular breast and pelvic exam and Pap smear while on this medicine. Use an additional method of contraception during the first cycle that you use this ring. Do not use a diaphragm or female condom, as the ring can interfere with these birth control methods and their proper placement. If you have any reason to think you are pregnant, stop using this medicine right away and contact your doctor or health care professional. If you are using this medicine for hormone related problems, it may take several cycles of use to see improvement in your condition. Smoking increases the risk of getting a blood clot or having a stroke while you are using hormonal birth control, especially if you are more than 31 years old. You are strongly advised not to smoke. This medicine can make your body retain fluid, making your fingers, hands, or ankles swell. Your blood pressure can go up. Contact your doctor or health care professional if you feel you are retaining fluid. This medicine can make you more sensitive to the sun. Keep out of the sun. If you cannot avoid being in the sun, wear protective clothing and use sunscreen. Do not use sun lamps  or tanning beds/booths. If you wear contact lenses and notice visual changes, or if the lenses begin to feel uncomfortable, consult your eye care specialist. In some women, tenderness, swelling,  or minor bleeding of the gums may occur. Notify your dentist if this happens. Brushing and flossing your teeth regularly may help limit this. See your dentist regularly and inform your dentist of the medicines you are taking. If you are going to have elective surgery, you may need to stop using this medicine before the surgery. Consult your health care professional for advice. This medicine does not protect you against HIV infection (AIDS) or any other sexually transmitted diseases. What side effects may I notice from receiving this medicine? Side effects that you should report to your doctor or health care professional as soon as possible: -breast tissue changes or discharge -changes in vaginal bleeding during your period or between your periods -chest pain -coughing up blood -dizziness or fainting spells -headaches or migraines -leg, arm or groin pain -severe or sudden headaches -stomach pain (severe) -sudden shortness of breath -sudden loss of coordination, especially on one side of the body -speech problems -symptoms of vaginal infection like itching, irritation or unusual discharge -tenderness in the upper abdomen -vomiting -weakness or numbness in the arms or legs, especially on one side of the body -yellowing of the eyes or skin Side effects that usually do not require medical attention (report to your doctor or health care professional if they continue or are bothersome): -breakthrough bleeding and spotting that continues beyond the 3 initial cycles of pills -breast tenderness -mood changes, anxiety, depression, frustration, anger, or emotional outbursts -increased sensitivity to sun or ultraviolet light -nausea -skin rash, acne, or brown spots on the skin -weight gain (slight) This  list may not describe all possible side effects. Call your doctor for medical advice about side effects. You may report side effects to FDA at 1-800-FDA-1088. Where should I keep my medicine? Keep out of the reach of children. Store at room temperature between 15 and 30 degrees C (59 and 86 degrees F) for up to 4 months. The product will expire after 4 months. Protect from light. Throw away any unused medicine after the expiration date. NOTE: This sheet is a summary. It may not cover all possible information. If you have questions about this medicine, talk to your doctor, pharmacist, or health care provider.  2018 Elsevier/[employer] (2016-06-10 17:00:31)

## 2017-04-13 LAB — COMPREHENSIVE METABOLIC PANEL
A/G RATIO: 1.5 (ref 1.2–2.2)
ALK PHOS: 84 IU/L (ref 39–117)
ALT: 15 IU/L (ref 0–32)
AST: 18 IU/L (ref 0–40)
Albumin: 4.3 g/dL (ref 3.5–5.5)
BUN/Creatinine Ratio: 17 (ref 9–23)
BUN: 13 mg/dL (ref 6–20)
Bilirubin Total: 0.4 mg/dL (ref 0.0–1.2)
CALCIUM: 8.6 mg/dL — AB (ref 8.7–10.2)
CO2: 21 mmol/L (ref 20–29)
CREATININE: 0.77 mg/dL (ref 0.57–1.00)
Chloride: 102 mmol/L (ref 96–106)
GFR calc Af Amer: 119 mL/min/{1.73_m2} (ref >59)
GFR, EST NON AFRICAN AMERICAN: 103 mL/min/{1.73_m2} (ref >59)
GLOBULIN, TOTAL: 2.8 g/dL (ref 1.5–4.5)
Glucose: 86 mg/dL (ref 65–99)
POTASSIUM: 4 mmol/L (ref 3.5–5.2)
SODIUM: 139 mmol/L (ref 134–144)
Total Protein: 7.1 g/dL (ref 6.0–8.5)

## 2017-04-13 LAB — HEMOGLOBIN A1C
Est. average glucose Bld gHb Est-mCnc: 111 mg/dL
Hgb A1c MFr Bld: 5.5 % (ref 4.8–5.6)

## 2017-04-13 LAB — CBC
Hematocrit: 40.2 % (ref 34.0–46.6)
Hemoglobin: 13.2 g/dL (ref 11.1–15.9)
MCH: 28.6 pg (ref 26.6–33.0)
MCHC: 32.8 g/dL (ref 31.5–35.7)
MCV: 87 fL (ref 79–97)
PLATELETS: 248 10*3/uL (ref 150–379)
RBC: 4.61 x10E6/uL (ref 3.77–5.28)
RDW: 13.7 % (ref 12.3–15.4)
WBC: 6.6 10*3/uL (ref 3.4–10.8)

## 2017-04-13 LAB — LIPID PANEL
CHOL/HDL RATIO: 5.2 ratio — AB (ref 0.0–4.4)
CHOLESTEROL TOTAL: 198 mg/dL (ref 100–199)
HDL: 38 mg/dL — AB (ref >39)
LDL Calculated: 135 mg/dL — ABNORMAL HIGH (ref 0–99)
TRIGLYCERIDES: 127 mg/dL (ref 0–149)
VLDL Cholesterol Cal: 25 mg/dL (ref 5–40)

## 2017-04-14 ENCOUNTER — Encounter: Payer: Self-pay | Admitting: Family Medicine

## 2017-04-14 LAB — PAP IG, CT-NG NAA, HPV HIGH-RISK
CHLAMYDIA, NUC. ACID AMP: NEGATIVE
GONOCOCCUS BY NUCLEIC ACID AMP: NEGATIVE
HPV, HIGH-RISK: NEGATIVE
PAP Smear Comment: 0

## 2017-04-18 ENCOUNTER — Telehealth: Payer: Self-pay

## 2017-04-18 NOTE — Telephone Encounter (Signed)
Called pt no answer. LM for pt informing her of information below. Also sent text for my chart sign up.  

## 2017-04-18 NOTE — Telephone Encounter (Signed)
-----   Message from Hildred LaserAnika Cherry, MD sent at 04/18/2017  8:38 AM EDT ----- Please inform patient of the following abnormal annual labs:  1. Calcium is borderline low.  Should increase calcium intake through foods.  2. Her cholesterol levels show that her HDL (good cholesterol) has improved since last year, however still borderline low. Would recommend fish oil supplement.  Her LDL was slightly elevated ("bad cholesterol"), and she should continue with dietary modifications and low-cholesterol foods.  The remainder of her lipid panel and other annual labs were normal.

## 2017-05-03 ENCOUNTER — Ambulatory Visit: Payer: BC Managed Care – PPO | Admitting: Psychiatry

## 2017-05-03 ENCOUNTER — Ambulatory Visit: Payer: BC Managed Care – PPO | Admitting: Licensed Clinical Social Worker

## 2017-05-11 ENCOUNTER — Ambulatory Visit (INDEPENDENT_AMBULATORY_CARE_PROVIDER_SITE_OTHER): Payer: BC Managed Care – PPO | Admitting: Family

## 2017-05-11 ENCOUNTER — Encounter: Payer: Self-pay | Admitting: Family

## 2017-05-11 VITALS — BP 110/72 | HR 80 | Temp 97.9°F | Resp 12 | Ht 65.0 in | Wt 186.0 lb

## 2017-05-11 DIAGNOSIS — F411 Generalized anxiety disorder: Secondary | ICD-10-CM | POA: Diagnosis not present

## 2017-05-11 DIAGNOSIS — K5900 Constipation, unspecified: Secondary | ICD-10-CM

## 2017-05-11 NOTE — Patient Instructions (Signed)
Referral to GI  [place] [facility]  Follow up 3-6 months

## 2017-05-11 NOTE — Progress Notes (Signed)
Subjective:    Patient ID: Andrea Roberson, female    DOB: 12/14/1985, 31 y.o.   MRN: [id number]  CC: Andrea Roberson is a 31 y.o. female who presents today to establish care.    HPI: [facility] and was not satisfied.  Complains of constipation/diarrhea, acid reflux for months, unchanged.  No abdominal pain today.   States 'always had stomach problems' since young girl , past 3 years and months gotten worse.  Doesn't matter what 'I eat.' Starts burping at night. Takes prilosec and gas -x to help with burping or excess gas. Intentional weight loss, 'on a diet' and lost 35 pounds.   Never been to GI. Gluten testing had been normal in the past.   Went to urgent care last week for abdominal pain, had labs and xray and came back normal per patient. Pain has improved since then.   Follows with OB. Suspecting 'endometriosis' per patient.   Depression and anxiety- Off and on for years. follows with  with psychiatry. No thoughts of hurting herself or anyone else.  Would like something for anxiety attacks. No mania, hallucinations.   Insomnia- Takes trazodone PRN for sleep.   Bariatric clinic in [place]- takes phentermine on days she is worried she will 'binge eat.'         HISTORY:  Past Medical History:  Diagnosis Date  . Anxiety   . Asthma   . Bipolar disorder (HCC)   . Depression   . Frequent headaches   . History of gestational diabetes 2016  . Hyperlipidemia   . Migraines   . Obesity (BMI 35.0-39.9 without comorbidity) 12/24/2015  . UTI (urinary tract infection)    Past Surgical History:  Procedure Laterality Date  . CESAREAN SECTION    . WISDOM TOOTH EXTRACTION     Family History  Problem Relation Age of Onset  . Heart disease Mother   . Diabetes Mother   . Anxiety disorder Mother   . Arthritis Mother   . Heart disease Father   . Diabetes Father   . Skin cancer Father   . Arthritis Father   . Colon polyps Father   .  Peptic Ulcer Disease Father   . Colon polyps Brother   . Colon cancer Paternal Aunt   . Heart disease Maternal Grandmother   . Colon cancer Paternal Grandfather     Allergies: Patient has no known allergies. Current Outpatient Prescriptions on File Prior to Visit  Medication Sig Dispense Refill  . FLUoxetine (PROZAC) 20 MG capsule Take 3 capsules (60 mg total) by mouth daily. 90 capsule 1  . phentermine (ADIPEX-P) 37.5 MG tablet Take 37.5 mg by mouth daily before breakfast.    . traZODone (DESYREL) 50 MG tablet Take 1 tablet (50 mg total) by mouth at bedtime. 30 tablet 1   No current facility-administered medications on file prior to visit.     Social History  Substance Use Topics  . Smoking status: Former Smoker    Quit date: 09/16/2012  . Smokeless tobacco: Never Used  . Alcohol use No    Review of Systems  Constitutional: Negative for chills and fever.  Respiratory: Negative for cough.   Cardiovascular: Negative for chest pain and palpitations.  Gastrointestinal: Positive for constipation and diarrhea. Negative for abdominal pain, anal bleeding, nausea and vomiting.  Genitourinary: Negative for dysuria.  Psychiatric/Behavioral: Negative for sleep disturbance and suicidal ideas. The patient is nervous/anxious.       Objective:  BP 110/72 (BP Location: Left Arm, Patient Position: Sitting, Cuff Size: Normal)   Pulse 80   Temp 97.9 F (36.6 C) (Oral)   Resp 12   Ht 5\' 5"  (1.651 m)   Wt 186 lb (84.4 kg)   LMP 05/11/2017   SpO2 99%   BMI 30.95 kg/m  BP Readings from Last 3 Encounters:  05/11/17 110/72  04/12/17 110/74  12/28/15 113/73   Wt Readings from Last 3 Encounters:  05/11/17 186 lb (84.4 kg)  04/12/17 190 lb 4.8 oz (86.3 kg)  12/28/15 218 lb (98.9 kg)    Physical Exam  Constitutional: She appears well-developed and well-nourished.  Eyes: Conjunctivae are normal.  Cardiovascular: Normal rate, regular rhythm, normal heart sounds and normal pulses.     Pulmonary/Chest: Effort normal and breath sounds normal. She has no wheezes. She has no rhonchi. She has no rales.  Abdominal: Soft. Normal appearance and bowel sounds are normal. She exhibits no distension, no fluid wave, no ascites and no mass. There is no tenderness. There is no rigidity, no rebound, no guarding and no CVA tenderness.  Neurological: She is alert.  Skin: Skin is warm and dry.  Psychiatric: She has a normal mood and affect. Her speech is normal and behavior is normal. Thought content normal.  Vitals reviewed.      Assessment & Plan:   Problem List Items Addressed This Visit      Digestive   Constipation - Primary    No abdominal pain today. Patient is well-appearing and she is afebrile. No tenderness appreciated on exam or abdominal distention. Patient and I jointly agreed to pursue abdominal ultrasound due to chronicity of abdominal symptoms. We also placed a referral to GI further evaluation, again due to chronicity of symptoms. Advised patient that if pain were to worsen or new symptoms develop, she'll let me know immediately. Return precautions given.      Relevant Orders   US Abdomen Complete   Ambulatory referral to Gastroenterology     Other   Generalized anxiety disorder    Acute on chronic.Not well controlled.  Advise trial of BuSpar to see if this helps with Prozac. Follow-up in 8 weeks.      Relevant Medications   busPIRone (BUSPAR) 7.5 MG tablet       I am having Andrea Roberson start on busPIRone. I am also having her maintain her FLUoxetine, traZODone, phentermine, Fish Oil, and omeprazole.   Meds ordered this encounter  Medications  . Omega-3 Fatty Acids (FISH OIL) 1000 MG CAPS    Sig: Take by mouth.  Marland Kitchen. omeprazole (PRILOSEC) 10 MG capsule    Sig: Take 10 mg by mouth daily.  . busPIRone (BUSPAR) 7.5 MG tablet    Sig: Take 1 tablet (7.5 mg total) by mouth 2 (two) times daily. Start 7.5 mg PO BID    Dispense:  60 tablet    Refill:  3    Order  Specific Question:   Supervising Provider    Answer:   Sherlene ShamsULLO, TERESA L [[id number]]    Return precautions given.   Risks, benefits, and alternatives of the medications and treatment plan prescribed today were discussed, and patient expressed understanding.   Education regarding symptom management and diagnosis given to patient on AVS.  Continue to follow with Dorcas CarrowJohnson, Megan P, DO for routine health maintenance.   Andrea Roberson and I agreed with plan.   Rennie PlowmanMargaret Jaeden [place]bay, FNP  I spent 25 min face to face w/ pt. Of which  greater than 50% of time was spent GAD and then what was discussed counseling, medications , CBT.

## 2017-05-12 ENCOUNTER — Encounter: Payer: Self-pay | Admitting: Family

## 2017-05-12 DIAGNOSIS — R198 Other specified symptoms and signs involving the digestive system and abdomen: Secondary | ICD-10-CM | POA: Insufficient documentation

## 2017-05-12 DIAGNOSIS — K5900 Constipation, unspecified: Secondary | ICD-10-CM | POA: Insufficient documentation

## 2017-05-12 MED ORDER — BUSPIRONE HCL 7.5 MG PO TABS: 7.5000 mg | ORAL_TABLET | Freq: Two times a day (BID) | ORAL | 3 refills | Status: DC

## 2017-05-12 NOTE — Assessment & Plan Note (Signed)
No abdominal pain today. Patient is well-appearing and she is afebrile. No tenderness appreciated on exam or abdominal distention. Patient and I jointly agreed to pursue abdominal ultrasound due to chronicity of abdominal symptoms. We also placed a referral to GI further evaluation, again due to chronicity of symptoms. Advised patient that if pain were to worsen or new symptoms develop, she'll let me know immediately. Return precautions given.

## 2017-05-12 NOTE — Assessment & Plan Note (Addendum)
Acute on chronic.Not well controlled.  Advise trial of BuSpar to see if this helps with Prozac. Advised continue follow-up with psychiatry. Follow-up in 8 weeks.

## 2017-05-15 ENCOUNTER — Encounter: Payer: Self-pay | Admitting: Family Medicine

## 2017-05-18 ENCOUNTER — Ambulatory Visit: Payer: BC Managed Care – PPO

## 2017-05-26 ENCOUNTER — Ambulatory Visit: Payer: BC Managed Care – PPO

## 2017-05-26 ENCOUNTER — Telehealth: Payer: Self-pay | Admitting: Obstetrics and Gynecology

## 2017-05-26 ENCOUNTER — Other Ambulatory Visit: Payer: Self-pay

## 2017-05-26 DIAGNOSIS — Z3049 Encounter for surveillance of other contraceptives: Secondary | ICD-10-CM

## 2017-05-26 MED ORDER — ETONOGESTREL-ETHINYL ESTRADIOL 0.12-0.015 MG/24HR VA RING: VAGINAL_RING | VAGINAL | 4 refills | Status: DC

## 2017-05-26 NOTE — Telephone Encounter (Signed)
Called pt no answer. LM for pt to call back.  

## 2017-05-26 NOTE — Telephone Encounter (Signed)
Pt calls back and states that at her last visit she was given a sample of nuvaring, this is working well for her ans she requests a script. RX sent in. Also sent text for mychart sign up.

## 2017-05-26 NOTE — Telephone Encounter (Signed)
The patient called and stated that she would like to speak with Edson Snowballki, She did not disclose any other information other than wanting to speak directly with Edson SnowballOki if possible when she gets a chance. Please advise.

## 2017-06-08 ENCOUNTER — Telehealth: Payer: Self-pay | Admitting: Gastroenterology

## 2017-06-08 NOTE — Telephone Encounter (Signed)
Patient left a voice message that she needed to reschedule her appt. I left a vm with her that I canceled her appt as she wanted and to call her back to reschedule

## 2017-06-12 ENCOUNTER — Ambulatory Visit: Payer: BC Managed Care – PPO | Admitting: Gastroenterology

## 2017-06-30 ENCOUNTER — Ambulatory Visit: Payer: BC Managed Care – PPO | Admitting: Gastroenterology

## 2017-07-12 ENCOUNTER — Encounter: Payer: Self-pay | Admitting: Gastroenterology

## 2017-07-12 ENCOUNTER — Ambulatory Visit (INDEPENDENT_AMBULATORY_CARE_PROVIDER_SITE_OTHER): Payer: BC Managed Care – PPO | Admitting: Gastroenterology

## 2017-07-12 ENCOUNTER — Encounter (INDEPENDENT_AMBULATORY_CARE_PROVIDER_SITE_OTHER): Payer: Self-pay

## 2017-07-12 VITALS — BP 130/82 | HR 76 | Temp 98.2°F | Ht 65.0 in | Wt 184.0 lb

## 2017-07-12 DIAGNOSIS — K581 Irritable bowel syndrome with constipation: Secondary | ICD-10-CM

## 2017-07-12 DIAGNOSIS — R1013 Epigastric pain: Secondary | ICD-10-CM | POA: Diagnosis not present

## 2017-07-12 MED ORDER — OMEPRAZOLE 40 MG PO CPDR: 40.0000 mg | DELAYED_RELEASE_CAPSULE | Freq: Two times a day (BID) | ORAL | 1 refills | Status: DC

## 2017-07-12 MED ORDER — LINACLOTIDE 145 MCG PO CAPS: 145.0000 ug | ORAL_CAPSULE | Freq: Every day | ORAL | 0 refills | Status: DC

## 2017-07-12 NOTE — Progress Notes (Signed)
Arlyss Repress, MD [address]  [address]  [place], [place] [address]  Main: [phone]  Fax: [phone]    Gastroenterology Consultation  Referring Provider:     Dorcas Carrow, DO Primary Care Physician:  Dorcas Carrow, DO Primary Gastroenterologist:  Dr. Arlyss Repress Reason for Consultation:     Altered bowel habits, abdominal pain        HPI:   FREDERICA CHRESTMAN is a 31 y.o.  female referred by Dr. Laural Benes, Oralia Rud, DO  for consultation & management of altered bowel habits.   She reports having stomach issues almost her whole life, but worse in last 2 months Alternating constipation, diarrhea, extreme bloating, gas, blood in stool Predominantly constipation Using stool softeners [employer] brand, gas-ex, laxative Also, has upper abdominal pain, dull achy or sharp/burning type. Not a/w eating or any particular food Heart burn, burping past 1year, takes OTC prilosec daily Denies dysphagia, n/v, regurgitation  She was taking Phentermine for weight loss, now switched to diethylpropion about a month ago. 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 Anxiety   . Asthma   . Bipolar disorder (HCC)   . Depression   . Frequent headaches   . History of gestational diabetes 2016  . Hyperlipidemia   . Migraines   . Obesity (BMI 35.0-39.9 without comorbidity) 12/24/2015  . UTI (urinary tract infection)     Past Surgical History:  Procedure Laterality Date  . CESAREAN SECTION    . WISDOM TOOTH EXTRACTION      Prior to Admission medications   Medication Sig Start Date End Date Taking? Authorizing Provider  etonogestrel-ethinyl estradiol (NUVARING) 0.12-0.015 MG/24HR vaginal ring Insert  vaginally and leave in place for 3 consecutive weeks, then remove for 1 week. 05/26/17  Yes Hildred Laser, MD  FLUoxetine (PROZAC) 20 MG capsule Take 3 capsules (60 mg total) by mouth daily. 04/04/17 04/04/18 Yes Ravi, Himabindu, MD  Omega-3 Fatty Acids (FISH OIL) 1000 MG CAPS Take by mouth.   Yes [provider]  omeprazole (PRILOSEC) 10 MG capsule Take 10 mg by mouth daily.   Yes [provider]  traZODone (DESYREL) 50 MG tablet Take 1 tablet (50 mg total) by mouth at bedtime. 04/04/17  Yes Patrick North, MD    Family History  Problem Relation Age of Onset  . Heart disease Mother   . Diabetes Mother   . Anxiety disorder Mother   . Arthritis Mother   . Heart disease Father   . Diabetes Father   . Skin cancer Father   . Arthritis Father   . Colon polyps Father   . Peptic Ulcer Disease Father   . Colon polyps Brother   . Colon cancer Paternal Aunt   . Heart disease Maternal Grandmother   . Colon cancer Paternal Grandfather      Social History  Substance Use Topics  . Smoking status: Former Smoker    Quit date: 09/16/2012  . Smokeless tobacco: Never Used  . Alcohol use No    Allergies as of 07/12/2017  . (No Known Allergies)    Review of Systems:    All systems reviewed and negative except where noted in HPI.  Physical Exam:  BP 130/82   Pulse 76   Temp 98.2 F (36.8 C) (Oral)   Ht  (1.651 m)   Wt 184 lb (83.5 kg)   BMI 30.62 kg/m  No LMP recorded.  General:   Alert,  Well-developed, well-nourished, pleasant and cooperative in NAD Head:  Normocephalic and atraumatic. Eyes:  Sclera clear, no icterus.   Conjunctiva pink. Ears:  Normal auditory acuity. Nose:  No deformity, discharge, or lesions. Mouth:  No deformity or lesions,oropharynx pink & moist. Neck:  Supple; no masses or thyromegaly. Lungs:  Respirations even and unlabored.  Clear throughout to auscultation.   No wheezes, crackles, or rhonchi. No acute distress. Heart:  Regular rate  and rhythm; no murmurs, clicks, rubs, or gallops. Abdomen:  Normal bowel sounds.  No bruits.  Soft, mild epigastric tenderness and non-distended without masses, hepatosplenomegaly or hernias noted.  No guarding or rebound tenderness.   Rectal: Nor performed Msk:  Symmetrical without gross deformities. Good, equal movement & strength bilaterally. Pulses:  Normal pulses noted. Extremities:  No clubbing or edema.  No cyanosis. Neurologic:  Alert and oriented x3;  grossly normal neurologically. Skin:  Intact without significant lesions or rashes. No jaundice. Lymph Nodes:  No significant cervical adenopathy. Psych:  Alert and cooperative. Normal mood and affect.  Imaging Studies: none  Assessment and Plan:   Fariha AURIANNA EARLYWINE is a 31 y.o. female with chronic GI symptoms, now with 2 months of worsening chronic abdominal pain, constipation, bloating, epigastric pain and heart burn. She does not demonstrate alarm signs or symptoms. Her weight loss is intentional. No IDA.  IBS-C: - Miralax 17gm BID - linaclotide daily   Dyspepsia and heart burn: - Likely functional - Trial of prilosec  BID for 8weeks  Follow up in 4-6weeks  Arlyss Repress, MD

## 2017-08-14 ENCOUNTER — Ambulatory Visit: Payer: BC Managed Care – PPO | Admitting: Family

## 2017-08-15 ENCOUNTER — Ambulatory Visit (INDEPENDENT_AMBULATORY_CARE_PROVIDER_SITE_OTHER): Payer: BC Managed Care – PPO | Admitting: Gastroenterology

## 2017-08-15 ENCOUNTER — Other Ambulatory Visit: Payer: Self-pay

## 2017-08-15 ENCOUNTER — Encounter: Payer: Self-pay | Admitting: Gastroenterology

## 2017-08-15 VITALS — BP 111/75 | HR 82 | Temp 98.3°F | Ht 65.0 in | Wt 186.8 lb

## 2017-08-15 DIAGNOSIS — K5909 Other constipation: Secondary | ICD-10-CM

## 2017-08-15 DIAGNOSIS — R1013 Epigastric pain: Secondary | ICD-10-CM | POA: Diagnosis not present

## 2017-08-15 NOTE — Progress Notes (Signed)
Arlyss Repress, MD [address]  [address]  [place], [place] [address]  Main: [phone]  Fax: [phone]    Gastroenterology Consultation  Referring Provider:     Dorcas Carrow, DO Primary Care Physician:  Dorcas Carrow, DO Primary Gastroenterologist:  Dr. Arlyss Repress Reason for Consultation:     Altered bowel habits, abdominal pain        HPI:   Andrea Roberson is a 31 y.o.  female referred by Dr. Laural Benes, Oralia Rud, DO  for consultation & management of altered bowel habits.   She reports having stomach issues almost her whole life, but worse in last 2 months Alternating constipation, diarrhea, extreme bloating, gas, blood in stool Predominantly constipation Using stool softeners [employer] brand, gas-ex, laxative Also, has upper abdominal pain, dull achy or sharp/burning type. Not a/w eating or any particular food Heart burn, burping past 1year, takes OTC prilosec daily Denies dysphagia, n/v, regurgitation  She was taking Phentermine for weight loss, now switched to diethylpropion about a month ago. 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 She tried linaclotide samples that I have given her. This has resulted in diarrhea and cramps that lasted for rest of the day. She was taking linzess in the morning. She tried linzess both with and without miralax, but her cramps and diarrhea persisted. She is currently on miralax only, having BM every other day. Her stools are formed but not hard, still a/w significant straining, spends about on toilet. She is on protonix 40mg  BID with dyspepsia, but no relief. Still fells significantly bloated. Not having  significant heart burn  GI Procedures: none  Past Medical History:  Diagnosis Date  . Anxiety   . Asthma   . Bipolar disorder (HCC)   . Depression   . Frequent headaches   . History of gestational diabetes 2016  . Hyperlipidemia   . Migraines   . Obesity (BMI 35.0-39.9 without comorbidity) 12/24/2015  . UTI (urinary tract infection)     Past Surgical History:  Procedure Laterality Date  . CESAREAN SECTION    . WISDOM TOOTH EXTRACTION      Prior to Admission medications   Medication Sig Start Date End Date Taking? Authorizing Provider  etonogestrel-ethinyl estradiol (NUVARING) 0.12-0.015 MG/24HR vaginal ring Insert vaginally and leave in place for 3 consecutive weeks, then remove for 1 week. 05/26/17  Yes Hildred Laser, MD  FLUoxetine (PROZAC) 20 MG capsule Take 3 capsules (60 mg total) by mouth daily. 04/04/17 04/04/18 Yes Ravi, Himabindu, MD  Omega-3 Fatty Acids (FISH OIL) 1000 MG CAPS Take by mouth.   Yes [provider]  omeprazole (PRILOSEC) 10 MG capsule Take 10 mg by mouth daily.   Yes [provider]  traZODone (DESYREL) 50 MG tablet Take 1 tablet (50 mg total) by mouth at bedtime. 04/04/17  Yes Patrick North, MD    Family History  Problem Relation Age of Onset  . Heart disease Mother   . Diabetes Mother   . Anxiety disorder Mother   . Arthritis Mother   . Heart disease Father   . Diabetes Father   . Skin cancer Father   . Arthritis Father   .  Colon polyps Father   . Peptic Ulcer Disease Father   . Colon polyps Brother   . Colon cancer Paternal Aunt   . Heart disease Maternal Grandmother   . Colon cancer Paternal Grandfather      Social History  Substance Use Topics  . Smoking status: Former Smoker    Quit date: 09/16/2012  . Smokeless tobacco: Never Used  . Alcohol use No    Allergies as of 08/15/2017  . (No Known Allergies)    Review of Systems:    All systems reviewed and negative except where noted in HPI.   Physical Exam:  BP  111/75   Pulse 82   Temp 98.3 F (36.8 C) (Oral)   Ht 5\' 5"  (1.651 m)   Wt 186 lb 12.8 oz (84.7 kg)   BMI 31.09 kg/m  No LMP recorded. Filed Weights   08/15/17 1531  Weight: 186 lb 12.8 oz (84.7 kg)   General:   Alert,  Well-developed, well-nourished, pleasant and cooperative in NAD Head:  Normocephalic and atraumatic. Eyes:  Sclera clear, no icterus.   Conjunctiva pink. Ears:  Normal auditory acuity. Nose:  No deformity, discharge, or lesions. Mouth:  No deformity or lesions,oropharynx pink & moist. Neck:  Supple; no masses or thyromegaly. Lungs:  Respirations even and unlabored.  Clear throughout to auscultation.   No wheezes, crackles, or rhonchi. No acute distress. Heart:  Regular rate and rhythm; no murmurs, clicks, rubs, or gallops. Abdomen:  Normal bowel sounds.  No bruits.  Soft, mild epigastric tenderness and non-distended without masses, hepatosplenomegaly or hernias noted.  No guarding or rebound tenderness.   Rectal: Nor performed Msk:  Symmetrical without gross deformities. Good, equal movement & strength bilaterally. Pulses:  Normal pulses noted. Extremities:  No clubbing or edema.  No cyanosis. Neurologic:  Alert and oriented x3;  grossly normal neurologically. Skin:  Intact without significant lesions or rashes. No jaundice. Lymph Nodes:  No significant cervical adenopathy. Psych:  Alert and cooperative. Normal mood and affect.  Imaging Studies: none  Assessment and Plan:   Andrea Roberson is a 31 y.o. female with chronic GI symptoms, now with 2 months of worsening chronic abdominal pain, constipation, bloating, epigastric pain and heart burn. She does not demonstrate alarm signs or symptoms. Her weight loss is intentional. No IDA. Here for follow up  IBS-C: I suspect that she may have a component of pelvic floor dyssynergia that can explain her significant straining despite having formed BM. Will try to better regulate her BMs, if symptoms still persistent, I  will try to refer her for pelvic floor biofeedback - Continue Miralax 17gm BID - Trial of linaclotide 72mcg daily  - She will let me know via Mychart  Dyspepsia and heart burn: - Failed trial of prilosec 40mg  BID, decrease to once daily - Recommend EGD with biopsies  I have discussed alternative options, risks & benefits,  which include, but are not limited to, bleeding, infection, perforation,respiratory complication & drug reaction.  The patient agrees with this plan & written consent will be obtained.    Follow up in 4-6weeks  Arlyss Repressohini R Tasnim Balentine, MD

## 2017-08-16 ENCOUNTER — Encounter: Payer: Self-pay | Admitting: *Deleted

## 2017-08-20 ENCOUNTER — Encounter: Payer: Self-pay | Admitting: Gastroenterology

## 2017-08-29 NOTE — Discharge Instructions (Signed)
General Anesthesia, Adult, Care After °These instructions provide you with information about caring for yourself after your procedure. Your health care provider may also give you more specific instructions. Your treatment has been planned according to current medical practices, but problems sometimes occur. Call your health care provider if you have any problems or questions after your procedure. °What can I expect after the procedure? °After the procedure, it is common to have: °· Vomiting. °· A sore throat. °· Mental slowness. ° °It is common to feel: °· Nauseous. °· Cold or shivery. °· Sleepy. °· Tired. °· Sore or achy, even in parts of your body where you did not have surgery. ° °Follow these instructions at home: °For at least 24 hours after the procedure: °· Do not: °? Participate in activities where you could fall or become injured. °? Drive. °? Use heavy machinery. °? Drink alcohol. °? Take sleeping pills or medicines that cause drowsiness. °? Make important decisions or sign legal documents. °? Take care of children on your own. °· Rest. °Eating and drinking °· If you vomit, drink water, juice, or soup when you can drink without vomiting. °· Drink enough fluid to keep your urine clear or pale yellow. °· Make sure you have little or no nausea before eating solid foods. °· Follow the diet recommended by your health care provider. °General instructions °· Have a responsible adult stay with you until you are awake and alert. °· Return to your normal activities as told by your health care provider. Ask your health care provider what activities are safe for you. °· Take over-the-counter and prescription medicines only as told by your health care provider. °· If you smoke, do not smoke without supervision. °· Keep all follow-up visits as told by your health care provider. This is important. °Contact a health care provider if: °· You continue to have nausea or vomiting at home, and medicines are not helpful. °· You  cannot drink fluids or start eating again. °· You cannot urinate after 8-12 hours. °· You develop a skin rash. °· You have fever. °· You have increasing redness at the site of your procedure. °Get help right away if: °· You have difficulty breathing. °· You have chest pain. °· You have unexpected bleeding. °· You feel that you are having a life-threatening or urgent problem. °This information is not intended to replace advice given to you by your health care provider. Make sure you discuss any questions you have with your health care provider. °Document Released: 01/09/2001 Document Revised: 03/07/2016 Document Reviewed: 09/17/2015 °Elsevier Interactive Patient Education © 2018 [employer] ° °

## 2017-08-30 ENCOUNTER — Encounter
Admission: RE | Disposition: A | Discharge status: Home / Self Care | Payer: Self-pay | Source: Ambulatory Visit | Attending: Gastroenterology

## 2017-08-30 ENCOUNTER — Ambulatory Visit: Payer: BC Managed Care – PPO | Admitting: Anesthesiology

## 2017-08-30 ENCOUNTER — Ambulatory Visit
Admission: RE | Admit: 2017-08-30 | Discharge: 2017-08-30 | Disposition: A | Discharge status: Home / Self Care | Payer: BC Managed Care – PPO | Source: Ambulatory Visit | Attending: Gastroenterology | Admitting: Gastroenterology

## 2017-08-30 DIAGNOSIS — R51 Headache: Secondary | ICD-10-CM | POA: Diagnosis not present

## 2017-08-30 DIAGNOSIS — J45909 Unspecified asthma, uncomplicated: Secondary | ICD-10-CM | POA: Diagnosis not present

## 2017-08-30 DIAGNOSIS — Z8261 Family history of arthritis: Secondary | ICD-10-CM | POA: Diagnosis not present

## 2017-08-30 DIAGNOSIS — Z800 Family history of malignant neoplasm of digestive organs: Secondary | ICD-10-CM | POA: Diagnosis not present

## 2017-08-30 DIAGNOSIS — K3189 Other diseases of stomach and duodenum: Secondary | ICD-10-CM

## 2017-08-30 DIAGNOSIS — F319 Bipolar disorder, unspecified: Secondary | ICD-10-CM | POA: Insufficient documentation

## 2017-08-30 DIAGNOSIS — Z6831 Body mass index (BMI) 31.0-31.9, adult: Secondary | ICD-10-CM | POA: Insufficient documentation

## 2017-08-30 DIAGNOSIS — Z833 Family history of diabetes mellitus: Secondary | ICD-10-CM | POA: Diagnosis not present

## 2017-08-30 DIAGNOSIS — Z818 Family history of other mental and behavioral disorders: Secondary | ICD-10-CM | POA: Diagnosis not present

## 2017-08-30 DIAGNOSIS — Z8371 Family history of colonic polyps: Secondary | ICD-10-CM | POA: Insufficient documentation

## 2017-08-30 DIAGNOSIS — Z79899 Other long term (current) drug therapy: Secondary | ICD-10-CM | POA: Insufficient documentation

## 2017-08-30 DIAGNOSIS — K2950 Unspecified chronic gastritis without bleeding: Secondary | ICD-10-CM | POA: Insufficient documentation

## 2017-08-30 DIAGNOSIS — E669 Obesity, unspecified: Secondary | ICD-10-CM | POA: Insufficient documentation

## 2017-08-30 DIAGNOSIS — F419 Anxiety disorder, unspecified: Secondary | ICD-10-CM | POA: Diagnosis not present

## 2017-08-30 DIAGNOSIS — Q6689 Other  specified congenital deformities of feet: Secondary | ICD-10-CM | POA: Insufficient documentation

## 2017-08-30 DIAGNOSIS — E785 Hyperlipidemia, unspecified: Secondary | ICD-10-CM | POA: Diagnosis not present

## 2017-08-30 DIAGNOSIS — Z8379 Family history of other diseases of the digestive system: Secondary | ICD-10-CM | POA: Insufficient documentation

## 2017-08-30 DIAGNOSIS — R1013 Epigastric pain: Secondary | ICD-10-CM

## 2017-08-30 DIAGNOSIS — Z8632 Personal history of gestational diabetes: Secondary | ICD-10-CM | POA: Diagnosis not present

## 2017-08-30 DIAGNOSIS — K5909 Other constipation: Secondary | ICD-10-CM

## 2017-08-30 HISTORY — DX: Other specified congenital deformities of feet: Q66.89

## 2017-08-30 HISTORY — PX: ESOPHAGOGASTRODUODENOSCOPY (EGD) WITH PROPOFOL: SHX5813

## 2017-08-30 HISTORY — DX: Presence of spectacles and contact lenses: Z97.3

## 2017-08-30 SURGERY — ESOPHAGOGASTRODUODENOSCOPY (EGD) WITH PROPOFOL
Anesthesia: General | Wound class: Clean Contaminated

## 2017-08-30 MED ORDER — OXYCODONE HCL 5 MG PO TABS: 5.0000 mg | ORAL_TABLET | Freq: Once | ORAL | Status: DC | PRN

## 2017-08-30 MED ORDER — MEPERIDINE HCL 25 MG/ML IJ SOLN: 6.2500 mg | INTRAMUSCULAR | Status: DC | PRN

## 2017-08-30 MED ORDER — PROMETHAZINE HCL 25 MG/ML IJ SOLN: 6.2500 mg | INTRAMUSCULAR | Status: DC | PRN

## 2017-08-30 MED ORDER — OXYCODONE HCL 5 MG/5ML PO SOLN: 5.0000 mg | Freq: Once | ORAL | Status: DC | PRN

## 2017-08-30 MED ORDER — OMEPRAZOLE 40 MG PO CPDR: 40.0000 mg | DELAYED_RELEASE_CAPSULE | Freq: Every day | ORAL | 1 refills | Status: DC

## 2017-08-30 MED ORDER — GLYCOPYRROLATE 0.2 MG/ML IJ SOLN
INTRAMUSCULAR | Status: DC | PRN
  Administered 2017-08-30: 0.1 mg via INTRAVENOUS

## 2017-08-30 MED ORDER — FENTANYL CITRATE (PF) 100 MCG/2ML IJ SOLN: 25.0000 ug | INTRAMUSCULAR | Status: DC | PRN

## 2017-08-30 MED ORDER — LINACLOTIDE 72 MCG PO CAPS: 72.0000 ug | ORAL_CAPSULE | Freq: Every day | ORAL | 2 refills | Status: DC

## 2017-08-30 MED ORDER — LIDOCAINE HCL (CARDIAC) 20 MG/ML IV SOLN
INTRAVENOUS | Status: DC | PRN
  Administered 2017-08-30: 50 mg via INTRAVENOUS

## 2017-08-30 MED ORDER — SODIUM CHLORIDE 0.9 % IV SOLN: INTRAVENOUS | Status: DC

## 2017-08-30 MED ORDER — LACTATED RINGERS IV SOLN
10.0000 mL/h | INTRAVENOUS | Status: DC
  Administered 2017-08-30: 10 mL/h via INTRAVENOUS

## 2017-08-30 MED ORDER — PROPOFOL 10 MG/ML IV BOLUS
INTRAVENOUS | Status: DC | PRN
  Administered 2017-08-30: 150 mg via INTRAVENOUS
  Administered 2017-08-30: 50 mg via INTRAVENOUS
  Administered 2017-08-30: 40 mg via INTRAVENOUS
  Administered 2017-08-30: 10 mg via INTRAVENOUS

## 2017-08-30 SURGICAL SUPPLY — 32 items
BALLN DILATOR 10-12 8 (BALLOONS)
BALLN DILATOR 12-15 8 (BALLOONS)
BALLN DILATOR 15-18 8 (BALLOONS)
BALLN DILATOR CRE 0-12 8 (BALLOONS)
BALLN DILATOR ESOPH 8 10 CRE (MISCELLANEOUS) IMPLANT
BALLOON DILATOR 12-15 8 (BALLOONS) IMPLANT
BALLOON DILATOR 15-18 8 (BALLOONS) IMPLANT
BALLOON DILATOR CRE 0-12 8 (BALLOONS) IMPLANT
BLOCK BITE 60FR ADLT L/F GRN (MISCELLANEOUS) ×3 IMPLANT
CANISTER SUCT 1200ML W/VALVE (MISCELLANEOUS) ×3 IMPLANT
CLIP HMST 235XBRD CATH ROT (MISCELLANEOUS) IMPLANT
CLIP RESOLUTION 360 11X235 (MISCELLANEOUS)
FCP ESCP3.2XJMB 240X2.8X (MISCELLANEOUS) ×1
FORCEPS BIOP RAD 4 LRG CAP 4 (CUTTING FORCEPS) IMPLANT
FORCEPS BIOP RJ4 240 W/NDL (MISCELLANEOUS) ×2
FORCEPS ESCP3.2XJMB 240X2.8X (MISCELLANEOUS) ×1 IMPLANT
GOWN CVR UNV OPN BCK APRN NK (MISCELLANEOUS) ×2 IMPLANT
GOWN ISOL THUMB LOOP REG UNIV (MISCELLANEOUS) ×4
INJECTOR VARIJECT VIN23 (MISCELLANEOUS) IMPLANT
KIT DEFENDO VALVE AND CONN (KITS) IMPLANT
KIT ENDO PROCEDURE OLY (KITS) ×3 IMPLANT
MARKER SPOT ENDO TATTOO 5ML (MISCELLANEOUS) IMPLANT
PAD GROUND ADULT SPLIT (MISCELLANEOUS) IMPLANT
RETRIEVER NET PLAT FOOD (MISCELLANEOUS) IMPLANT
SNARE SHORT THROW 13M SML OVAL (MISCELLANEOUS) IMPLANT
SNARE SHORT THROW 30M LRG OVAL (MISCELLANEOUS) IMPLANT
SPOT EX ENDOSCOPIC TATTOO (MISCELLANEOUS)
SYR INFLATION 60ML (SYRINGE) IMPLANT
TRAP ETRAP POLY (MISCELLANEOUS) IMPLANT
VARIJECT INJECTOR VIN23 (MISCELLANEOUS)
WATER STERILE IRR 250ML POUR (IV SOLUTION) ×3 IMPLANT
WIRE CRE 18-20MM 8CM F G (MISCELLANEOUS) IMPLANT

## 2017-08-30 NOTE — Transfer of Care (Signed)
Immediate Anesthesia Transfer of Care Note  Patient: Andrea Roberson  Procedure(s) Performed: ESOPHAGOGASTRODUODENOSCOPY (EGD) WITH PROPOFOL (N/A )  Patient Location: PACU  Anesthesia Type: General  Level of Consciousness: awake, alert  and patient cooperative  Airway and Oxygen Therapy: Patient Spontanous Breathing and Patient connected to supplemental oxygen  Post-op Assessment: Post-op Vital signs reviewed, Patient's Cardiovascular Status Stable, Respiratory Function Stable, Patent Airway and No signs of Nausea or vomiting  Post-op Vital Signs: Reviewed and stable  Complications: No apparent anesthesia complications

## 2017-08-30 NOTE — H&P (Signed)
Arlyss Repressohini R Vanga, MD [id number]  [address]  [place], [place] [address]  Main: [phone]  Fax: [phone] Pager: [phone]  Primary Care Physician:  Allegra GranaArnett, Margaret G, FNP Primary Gastroenterologist:  Dr. Arlyss Repressohini R Vanga  Pre-Procedure History & Physical: HPI:  Okey DupreCrystal M Spear is a 31 y.o. female is here for an endoscopy.   Past Medical History:  Diagnosis Date  . Anxiety   . Asthma   . Bipolar disorder (HCC)   . Depression   . Frequent headaches   . History of gestational diabetes 2016  . Hyperlipidemia   . Migraines   . Obesity (BMI 35.0-39.9 without comorbidity) 12/24/2015  . Tarsal coalition of left foot   . UTI (urinary tract infection)   . Wears contact lenses     Past Surgical History:  Procedure Laterality Date  . CESAREAN SECTION    . WISDOM TOOTH EXTRACTION      Prior to Admission medications   Medication Sig Start Date End Date Taking? Authorizing Provider  cetirizine-pseudoephedrine (ZYRTEC-D) 5-120 MG tablet Take by mouth.   Yes [provider]  Diethylpropion HCl CR 75 MG TB24 Take by mouth daily.   Yes [provider]  FLUoxetine (PROZAC) 20 MG capsule Take 3 capsules (60 mg total) by mouth daily. 04/04/17 04/04/18 Yes Ravi, Himabindu, MD  fluticasone (FLONASE) 50 MCG/ACT nasal spray Place into the nose.   Yes [provider]  Linaclotide (LINZESS PO) Take by mouth.   Yes [provider]  Omega-3 Fatty Acids (FISH OIL) 1000 MG CAPS Take by mouth.   Yes [provider]  omeprazole (PRILOSEC) 40 MG capsule Take 1 capsule (40 mg total) by mouth 2 (two) times daily before a meal. 07/12/17 09/10/17 Yes Vanga, Loel Dubonnetohini Reddy, MD  etonogestrel-ethinyl estradiol (NUVARING) 0.12-0.015 MG/24HR vaginal ring Insert vaginally and leave in place for 3 consecutive weeks, then remove for 1 week. Patient not taking: Reported on 08/16/2017 05/26/17   Hildred Laserherry, Anika, MD  traZODone (DESYREL) 50 MG tablet Take 1 tablet (50 mg  total) by mouth at bedtime. 04/04/17   Patrick Northavi, Himabindu, MD    Allergies as of 08/15/2017  . (No Known Allergies)    Family History  Problem Relation Age of Onset  . Heart disease Mother   . Diabetes Mother   . Anxiety disorder Mother   . Arthritis Mother   . Heart disease Father   . Diabetes Father   . Skin cancer Father   . Arthritis Father   . Colon polyps Father   . Peptic Ulcer Disease Father   . Colon polyps Brother   . Colon cancer Paternal Aunt   . Heart disease Maternal Grandmother   . Colon cancer Paternal Grandfather     Social History   Socioeconomic History  . Marital status: Married    Spouse name: Not on file  . Number of children: Not on file  . Years of education: Not on file  . Highest education level: Not on file  Social Needs  . Financial resource strain: Not on file  . Food insecurity - worry: Not on file  . Food insecurity - inability: Not on file  . Transportation needs - medical: Not on file  . Transportation needs - non-medical: Not on file  Occupational History  . Not on file  Tobacco Use  . Smoking status: Former Smoker    Last attempt to quit: 09/16/2012    Years since quitting: 4.9  . Smokeless tobacco: Never Used  Substance and Sexual Activity  . Alcohol use: Yes    Comment: 2 glasswine/month  . Drug use: No  . Sexual activity: Yes    Birth control/protection: None  Other Topics Concern  . 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 Neck:  Supple; no masses or thyromegaly. Lungs:  Clear throughout to auscultation.    Heart:  Regular  rate and rhythm. Abdomen:  Soft, nontender and nondistended. Normal bowel sounds, without guarding, and without rebound.   Neurologic:  Alert and  oriented x4;  grossly normal neurologically.  Impression/Plan: Brianna Georga BoraM Mahajan is here for an endoscopy to be performed for dyspepsia  Risks, benefits, limitations, and alternatives regarding  endoscopy have been reviewed with the patient.  Questions have been answered.  All parties agreeable.   Lannette Donathohini Vanga, MD  08/30/2017, 8:34 AM

## 2017-08-30 NOTE — Anesthesia Procedure Notes (Signed)
Date/Time: 08/30/2017 9:39 AM Performed by: Maree KrabbeWarren, Georjean Toya, CRNA Pre-anesthesia Checklist: Patient identified, Emergency Drugs available, Suction available, Timeout performed and Patient being monitored Patient Re-evaluated:Patient Re-evaluated prior to induction Oxygen Delivery Method: Nasal cannula Placement Confirmation: positive ETCO2

## 2017-08-30 NOTE — Op Note (Signed)
[facility] Gastroenterology Patient Name: Andrea Roberson Procedure Date: 08/30/2017 9:38 AM MRN: [id number] Account #: [id number] Date of Birth: July 21, 1986 Admit Type: Outpatient Age: 31 Room: [facility] OR ROOM 01 Gender: Female Note Status: Finalized Procedure:            Upper GI endoscopy Indications:          Dyspepsia Providers:            Lin Landsman MD, MD Referring MD:         Yvetta Coder. Arnett (Referring MD) Medicines:            Monitored Anesthesia Care Complications:        No immediate complications. Estimated blood loss:                        Minimal. Procedure:            Pre-Anesthesia Assessment:                       - Prior to the procedure, a History and Physical was                        performed, and patient medications and allergies were                        reviewed. The patient is competent. The risks and                        benefits of the procedure and the sedation options and                        risks were discussed with the patient. All questions                        were answered and informed consent was obtained.                        Patient identification and proposed procedure were                        verified by the physician, the nurse, the                        anesthesiologist, the anesthetist and the technician in                        the pre-procedure area in the procedure room. Mental                        Status Examination: alert and oriented. Airway                        Examination: normal oropharyngeal airway and neck                        mobility. Respiratory Examination: clear to                        auscultation. CV Examination: normal. Prophylactic  Antibiotics: The patient does not require prophylactic                        antibiotics. Prior Anticoagulants: The patient has                        taken no previous anticoagulant or antiplatelet agents.                         ASA Grade Assessment: II - A patient with mild systemic                        disease. After reviewing the risks and benefits, the                        patient was deemed in satisfactory condition to undergo                        the procedure. The anesthesia plan was to use monitored                        anesthesia care (MAC). Immediately prior to                        administration of medications, the patient was                        re-assessed for adequacy to receive sedatives. The                        heart rate, respiratory rate, oxygen saturations, blood                        pressure, adequacy of pulmonary ventilation, and                        response to care were monitored throughout the                        procedure. The physical status of the patient was                        re-assessed after the procedure.                       After obtaining informed consent, the endoscope was                        passed under direct vision. Throughout the procedure,                        the patient's blood pressure, pulse, and oxygen                        saturations were monitored continuously. The Olympus                        GIF-HQ190 Endoscope (S#. [id number]) was introduced                        through  the mouth, and advanced to the third part of                        duodenum. The upper GI endoscopy was accomplished                        without difficulty. The patient tolerated the procedure                        well. Findings:      The duodenal bulb, second portion of the duodenum and third portion of       the duodenum were normal.      Diffuse mildly erythematous mucosa without bleeding was found in the       gastric antrum.      The cardia, gastric fundus, gastric body and incisura were normal.       Biopsies were taken with a cold forceps for Helicobacter pylori testing.      The cardia and gastric fundus were normal on  retroflexion.      The gastroesophageal junction and examined esophagus were normal. Impression:           - Normal duodenal bulb, second portion of the duodenum                        and third portion of the duodenum.                       - Erythematous mucosa in the antrum.                       - Normal cardia, gastric fundus, gastric body and                        incisura. Biopsied.                       - Normal gastroesophageal junction and esophagus. Recommendation:       - Await pathology results.                       - Discharge patient to home.                       - Resume previous diet today.                       - Continue present medications.                       - Return to GI clinic in 3 months. Procedure Code(s):    --- Professional ---                       [id number], Esophagogastroduodenoscopy, flexible, transoral;                        with biopsy, single or multiple Diagnosis Code(s):    --- Professional ---                       K31.89, Other diseases of stomach and duodenum  R10.13, Epigastric pain CPT copyright 2016 American Medical Association. All rights reserved. The codes documented in this report are preliminary and upon coder review may  be revised to meet current compliance requirements. Dr. Ulyess Mort Lin Landsman MD, MD 08/30/2017 9:52:44 AM This report has been signed electronically. Number of Addenda: 0 Note Initiated On: 08/30/2017 9:38 AM      [facility]

## 2017-08-30 NOTE — Anesthesia Postprocedure Evaluation (Signed)
Anesthesia Post Note  Patient: Andrea DupreCrystal M Roberson  Procedure(s) Performed: ESOPHAGOGASTRODUODENOSCOPY (EGD) WITH PROPOFOL (N/A )  Patient location during evaluation: PACU Anesthesia Type: General Level of consciousness: awake and alert Pain management: pain level controlled Vital Signs Assessment: post-procedure vital signs reviewed and stable Respiratory status: spontaneous breathing Cardiovascular status: blood pressure returned to baseline Postop Assessment: no headache Anesthetic complications: no    Verner Cholunkle, III,  Nesta Scaturro D

## 2017-08-30 NOTE — Anesthesia Preprocedure Evaluation (Signed)
Anesthesia Evaluation  Patient identified by MRN, date of birth, ID band Patient awake    Reviewed: Allergy & Precautions, H&P , NPO status , Patient's Chart, lab work & pertinent test results  Airway Mallampati: I  TM Distance: >3 FB Neck ROM: full    Dental no notable dental hx.    Pulmonary asthma , former smoker,    Pulmonary exam normal        Cardiovascular Normal cardiovascular exam     Neuro/Psych    GI/Hepatic negative GI ROS, Neg liver ROS,   Endo/Other  negative endocrine ROS  Renal/GU negative Renal ROS     Musculoskeletal   Abdominal   Peds  Hematology negative hematology ROS (+)   Anesthesia Other Findings   Reproductive/Obstetrics negative OB ROS                            Anesthesia Physical Anesthesia Plan  ASA: II  Anesthesia Plan: General   Post-op Pain Management:    Induction:   PONV Risk Score and Plan:   Airway Management Planned:   Additional Equipment:   Intra-op Plan:   Post-operative Plan:   Informed Consent: I have reviewed the patients History and Physical, chart, labs and discussed the procedure including the risks, benefits and alternatives for the proposed anesthesia with the patient or authorized representative who has indicated his/her understanding and acceptance.     Plan Discussed with:   Anesthesia Plan Comments:         Anesthesia Quick Evaluation

## 2017-08-31 ENCOUNTER — Encounter: Payer: Self-pay | Admitting: Gastroenterology

## 2017-09-01 ENCOUNTER — Encounter: Payer: Self-pay | Admitting: Gastroenterology

## 2017-09-04 ENCOUNTER — Encounter: Payer: Self-pay | Admitting: Family

## 2017-09-05 ENCOUNTER — Other Ambulatory Visit: Payer: Self-pay | Admitting: Internal Medicine

## 2017-09-05 MED ORDER — FLUOXETINE HCL 20 MG PO CAPS: 60.0000 mg | ORAL_CAPSULE | Freq: Every day | ORAL | 3 refills | Status: DC

## 2017-09-12 ENCOUNTER — Ambulatory Visit: Payer: BC Managed Care – PPO | Admitting: Gastroenterology

## 2018-01-26 ENCOUNTER — Ambulatory Visit: Payer: Self-pay | Admitting: Podiatry

## 2018-02-09 ENCOUNTER — Ambulatory Visit: Payer: BC Managed Care – PPO | Admitting: Podiatry

## 2018-02-09 ENCOUNTER — Encounter: Payer: Self-pay | Admitting: Podiatry

## 2018-02-09 ENCOUNTER — Ambulatory Visit: Payer: BC Managed Care – PPO

## 2018-02-09 DIAGNOSIS — M2140 Flat foot [pes planus] (acquired), unspecified foot: Secondary | ICD-10-CM

## 2018-02-09 DIAGNOSIS — Q6689 Other  specified congenital deformities of feet: Secondary | ICD-10-CM

## 2018-02-09 MED ORDER — MELOXICAM 15 MG PO TABS: 15.0000 mg | ORAL_TABLET | Freq: Every day | ORAL | 1 refills | Status: DC

## 2018-02-09 MED ORDER — TRAMADOL HCL 50 MG PO TABS: 50.0000 mg | ORAL_TABLET | Freq: Four times a day (QID) | ORAL | 0 refills | Status: DC | PRN

## 2018-02-12 NOTE — Progress Notes (Signed)
   HPI: 32 year old female presenting today as a new patient with a chief complaint of burning, stabbing pain of the left ankle that began 2-3 months ago. She reports associated swelling. She states she has been having problems with the ankle for the past twelve years but it has gotten progressively worse over the past couple months. Walking, bearing weight and moving the ankle increases the pain. She has taken Ibuprofen, wrapped it in an ace bandage and has an air cast with no significant relief. She states she was seen at [facility] on 01/11/18. Patient is here for further evaluation and treatment.   Past Medical History:  Diagnosis Date  . Anxiety   . Asthma   . Bipolar disorder (HCC)   . Depression   . Frequent headaches   . History of gestational diabetes 2016  . Hyperlipidemia   . Migraines   . Obesity (BMI 35.0-39.9 without comorbidity) 12/24/2015  . Tarsal coalition of left foot   . UTI (urinary tract infection)   . Wears contact lenses      Physical Exam: General: The patient is alert and oriented x3 in no acute distress.  Dermatology: Skin is warm, dry and supple bilateral lower extremities. Negative for open lesions or macerations.  Vascular: Palpable pedal pulses bilaterally. No edema or erythema noted. Capillary refill within normal limits.  Neurological: Epicritic and protective threshold grossly intact bilaterally.   Musculoskeletal Exam: Limited ROM with inversion and eversion of the subtalar joint. Muscle strength 5/5 in all groups bilateral.   Assessment: 1. Tarsal coalition left   Plan of Care:  1. Patient evaluated.  2. Prescription for Tramadol provided to patient.  3. Prescription for Meloxicam provided to patient.  4. Order for MRI placed today for left foot.  5. Compression anklet dispensed.  6. Return to clinic in 4 weeks.       Felecia Shelling, DPM [facility]  Dr. Felecia Shelling, DPM    [address] [place], [place] [address]                Office [phone]  Fax [phone]

## 2018-02-15 ENCOUNTER — Telehealth: Payer: Self-pay

## 2018-02-15 NOTE — Telephone Encounter (Signed)
-----   Message from Brent M Evans, DPM sent at 02/09/2018  2:02 PM EDT ----- Regarding: MRI left foot Please order MRI left foot w/out contrast.   Dx: tarsal coalition left  Thanks, Dr. Evans 

## 2018-02-15 NOTE — Telephone Encounter (Signed)
Dr. Logan Bores, this needs peer to peer.  Please call [phone] and follow prompts for peer to peer discussion.  Please let me know when done   Thanks

## 2018-02-19 ENCOUNTER — Ambulatory Visit: Payer: BC Managed Care – PPO | Admitting: Family

## 2018-02-19 ENCOUNTER — Encounter: Payer: Self-pay | Admitting: Family

## 2018-02-19 VITALS — BP 110/78 | HR 58 | Temp 98.7°F | Resp 16 | Wt 209.0 lb

## 2018-02-19 DIAGNOSIS — F411 Generalized anxiety disorder: Secondary | ICD-10-CM | POA: Diagnosis not present

## 2018-02-19 DIAGNOSIS — E669 Obesity, unspecified: Secondary | ICD-10-CM

## 2018-02-19 DIAGNOSIS — R1013 Epigastric pain: Secondary | ICD-10-CM | POA: Diagnosis not present

## 2018-02-19 MED ORDER — FLUOXETINE HCL 20 MG PO CAPS: 60.0000 mg | ORAL_CAPSULE | Freq: Every day | ORAL | 3 refills | Status: DC

## 2018-02-19 NOTE — Assessment & Plan Note (Signed)
Education provided on long-term risk of PPIs.  Patient will try Zantac.  Will follow.

## 2018-02-19 NOTE — Progress Notes (Signed)
Subjective:    Patient ID: Andrea Roberson, female    DOB: November 26, 1985, 32 y.o.   MRN: [id number]  CC: Andrea Roberson is a 32 y.o. female who presents today for follow up.   HPI: Depression- would like to back on  prozac. Has been on for couple months.  The 'one medication that has helped the most.' Started by with [facility]. Dr Meryl Crutch.   Was prescribed trazodone for sleeping. Takes half tablet prn.   Patient describes she was told that she had GAD , not bipolar from Dr Meryl Crutch.   No mania, hallucinations.   No si/hi  Concerned with weight gain and fatigue. Thinks stress and being off of prozac playing a role.Mother has been sick.  [employer] 3 days per week. Doesn't snore.   GERD- taking prilosec daily.    HISTORY:  Past Medical History:  Diagnosis Date  . Anxiety   . Asthma   . Bipolar disorder (HCC)   . Depression   . Frequent headaches   . History of gestational diabetes 2016  . Hyperlipidemia   . Migraines   . Obesity (BMI 35.0-39.9 without comorbidity) 12/24/2015  . Tarsal coalition of left foot   . UTI (urinary tract infection)   . Wears contact lenses    Past Surgical History:  Procedure Laterality Date  . CESAREAN SECTION    . ESOPHAGOGASTRODUODENOSCOPY (EGD) WITH PROPOFOL N/A 08/30/2017   Procedure: ESOPHAGOGASTRODUODENOSCOPY (EGD) WITH PROPOFOL;  Surgeon: Toney Reil, MD;  [facility];  Service: Endoscopy;  Laterality: N/A;  . WISDOM TOOTH EXTRACTION     Family History  Problem Relation Age of Onset  . Heart disease Mother   . Diabetes Mother   . Anxiety disorder Mother   . Arthritis Mother   . Heart disease Father   . Diabetes Father   . Skin cancer Father   . Arthritis Father   . Colon polyps Father   . Peptic Ulcer Disease Father   . Colon polyps Brother   . Colon cancer Paternal Aunt   . Heart disease Maternal Grandmother   . Colon cancer Paternal Grandfather     Allergies: Patient has no known  allergies. Current Outpatient Medications on File Prior to Visit  Medication Sig Dispense Refill  . cetirizine-pseudoephedrine (ZYRTEC-D) 5-120 MG tablet Take by mouth.    . fluticasone (FLONASE) 50 MCG/ACT nasal spray Place into the nose.    . Omega-3 Fatty Acids (FISH OIL) 1000 MG CAPS Take by mouth.    . traMADol (ULTRAM) 50 MG tablet Take 1 tablet (50 mg total) by mouth every 6 (six) hours as needed. 60 tablet 0  . traZODone (DESYREL) 50 MG tablet Take 1 tablet (50 mg total) by mouth at bedtime. 30 tablet 1  . meloxicam (MOBIC) 15 MG tablet TK 1 T PO QD  0  . omeprazole (PRILOSEC) 40 MG capsule Take 1 capsule (40 mg total) daily by mouth. 30 capsule 1   No current facility-administered medications on file prior to visit.     Social History   Tobacco Use  . Smoking status: Former Smoker    Last attempt to quit: 09/16/2012    Years since quitting: 5.4  . Smokeless tobacco: Never Used  Substance Use Topics  . Alcohol use: Yes    Comment: 2 glasswine/month  . Drug use: No    Review of Systems  Constitutional: Positive for fatigue. Negative for chills and fever.  Cardiovascular: Negative for chest pain and  palpitations.  Gastrointestinal: Negative for nausea and vomiting.  Psychiatric/Behavioral: Negative for dysphoric mood, hallucinations, sleep disturbance and suicidal ideas. The patient is nervous/anxious.       Objective:    BP 110/78 (BP Location: Left Arm, Patient Position: Sitting, Cuff Size: Large)   Pulse (!) 58   Temp 98.7 F (37.1 C) (Oral)   Resp 16   Wt 209 lb (94.8 kg)   SpO2 98%   BMI 34.78 kg/m  BP Readings from Last 3 Encounters:  02/19/18 110/78  08/30/17 (!) 98/42  08/15/17 111/75   Wt Readings from Last 3 Encounters:  02/19/18 209 lb (94.8 kg)  08/30/17 188 lb (85.3 kg)  08/15/17 186 lb 12.8 oz (84.7 kg)    Physical Exam  Constitutional: She appears well-developed and well-nourished.  Eyes: Conjunctivae are normal.  Cardiovascular: Normal  rate, regular rhythm, normal heart sounds and normal pulses.  Pulmonary/Chest: Effort normal and breath sounds normal. She has no wheezes. She has no rhonchi. She has no rales.  Neurological: She is alert.  Skin: Skin is warm and dry.  Psychiatric: She has a normal mood and affect. Her speech is normal and behavior is normal. Thought content normal.  Vitals reviewed.      Assessment & Plan:   Problem List Items Addressed This Visit      Other   Obesity (BMI 35.0-39.9 without comorbidity)    Discussed with patient I suspect multifactorial.  She had family illness at the time and has been off of her antidepressant.  At this time pending laboratory work-up as well as starting Prozac back.  Advised patient to continue with [facility] exercise.  Follow-up appointment in 3 months to regroup to ensure that patient's mood, weight is improving.      Relevant Orders   Comprehensive metabolic panel   CBC with Differential/Platelet   Hemoglobin A1c   TSH   VITAMIN D 25 Hydroxy (Vit-D Deficiency, Fractures)   HIV antibody   Lipid panel   Generalized anxiety disorder - Primary    Discussed with patient about my concern of SSRIs and history of bipolar.  Per patient, she suspects this was a misdiagnosis.  No evidence today of mania, hallucinations and no history thereof.  She prefers to follow here for psychiatry due to cost, preference.  I think is appropriate as long as she is stable.   At this time that she is done well on Prozac in the past, I agreed to continue.  Patient will stay vigilant of any strange or unusual thoughts, behaviors, patient let me know immediately.  Advised patient to titrate up dose gently.      Relevant Medications   FLUoxetine (PROZAC) 20 MG capsule   Dyspepsia    Education provided on long-term risk of PPIs.  Patient will try Zantac.  Will follow.          I have changed Andrea Roberson's FLUoxetine. I am also having her maintain her traZODone, Fish Oil,  omeprazole, cetirizine-pseudoephedrine, fluticasone, meloxicam, and traMADol.   Meds ordered this encounter  Medications  . FLUoxetine (PROZAC) 20 MG capsule    Sig: Take 3 capsules (60 mg total) by mouth daily.    Dispense:  90 capsule    Refill:  3    Order Specific Question:   Supervising Provider    Answer:   Sherlene Shams [[id number]]    Return precautions given.   Risks, benefits, and alternatives of the medications and treatment plan prescribed today were  discussed, and patient expressed understanding.   Education regarding symptom management and diagnosis given to patient on AVS.  Continue to follow with Allegra Grana, FNP for routine health maintenance.   Karilyn Georga Bora and I agreed with plan.   Rennie Plowman, FNP

## 2018-02-19 NOTE — Assessment & Plan Note (Addendum)
Discussed with patient I suspect multifactorial.  She had family illness at the time and has been off of her antidepressant.  At this time pending laboratory work-up as well as starting Prozac back.  Advised patient to continue with [facility] exercise.  Follow-up appointment in 3 months to regroup to ensure that patient's mood, weight is improving.

## 2018-02-19 NOTE — Patient Instructions (Addendum)
Titrate prozac back up - start  and do this one for a couple of weeks. Then increase to  for a couple of weeks. If you still arent getting the effects, may increase to  once per day.   Labs today  Long term use beyond 3 months of proton pump inhibitors , also called PPI's, is associated with malabsorption of vitamins, chronic kidney disease, fracture risk, and diarrheal illnesses. PPI's include Nexium, Prilosec, Protonix, Dexilant, and Prevacid.   I generally recommend trying to control acid reflux with lifestyle modifications including avoiding trigger foods, not eating 2 hours prior to bedtime. You may use histamine 2 blockers daily to twice daily ( this is Zantac, Pepcid) and then when symptoms flare, start back on PPI for short course.   Of note, we will need to do an endoscopy ( upper GI) to evaluate your esophagus, stomach in the future if acid reflux persists are you develop red flag symptoms: trouble swallowing, hoarseness, chronic cough, unexplained weight loss.   Come back in 3 months so we can regroup and see how your mood, weight has changed. Pleasure seeing you.

## 2018-02-19 NOTE — Assessment & Plan Note (Signed)
Discussed with patient about my concern of SSRIs and history of bipolar.  Per patient, she suspects this was a misdiagnosis.  No evidence today of mania, hallucinations and no history thereof.  She prefers to follow here for psychiatry due to cost, preference.  I think is appropriate as long as she is stable.   At this time that she is done well on Prozac in the past, I agreed to continue.  Patient will stay vigilant of any strange or unusual thoughts, behaviors, patient let me know immediately.  Advised patient to titrate up dose gently.

## 2018-02-20 LAB — COMPREHENSIVE METABOLIC PANEL
ALT: 18 U/L (ref 0–35)
AST: 17 U/L (ref 0–37)
Albumin: 4 g/dL (ref 3.5–5.2)
Alkaline Phosphatase: 68 U/L (ref 39–117)
BUN: 10 mg/dL (ref 6–23)
CALCIUM: 9.2 mg/dL (ref 8.4–10.5)
CHLORIDE: 103 meq/L (ref 96–112)
CO2: 28 meq/L (ref 19–32)
Creatinine, Ser: 0.75 mg/dL (ref 0.40–1.20)
GFR: 95.18 mL/min (ref >60.00)
Glucose, Bld: 88 mg/dL (ref 70–99)
POTASSIUM: 4.2 meq/L (ref 3.5–5.1)
Sodium: 139 mEq/L (ref 135–145)
Total Bilirubin: 0.4 mg/dL (ref 0.2–1.2)
Total Protein: 7.3 g/dL (ref 6.0–8.3)

## 2018-02-20 LAB — CBC WITH DIFFERENTIAL/PLATELET
BASOS PCT: 1.8 % (ref 0.0–3.0)
Basophils Absolute: 0.2 10*3/uL — ABNORMAL HIGH (ref 0.0–0.1)
EOS PCT: 1.8 % (ref 0.0–5.0)
Eosinophils Absolute: 0.2 10*3/uL (ref 0.0–0.7)
HEMATOCRIT: 39.7 % (ref 36.0–46.0)
Hemoglobin: 13.2 g/dL (ref 12.0–15.0)
LYMPHS PCT: 33.8 % (ref 12.0–46.0)
Lymphs Abs: 3.2 10*3/uL (ref 0.7–4.0)
MCHC: 33.3 g/dL (ref 30.0–36.0)
MCV: 86.9 fl (ref 78.0–100.0)
MONOS PCT: 5.2 % (ref 3.0–12.0)
Monocytes Absolute: 0.5 10*3/uL (ref 0.1–1.0)
NEUTROS ABS: 5.5 10*3/uL (ref 1.4–7.7)
Neutrophils Relative %: 57.4 % (ref 43.0–77.0)
PLATELETS: 285 10*3/uL (ref 150.0–400.0)
RBC: 4.56 Mil/uL (ref 3.87–5.11)
RDW: 12.6 % (ref 11.5–15.5)
WBC: 9.6 10*3/uL (ref 4.0–10.5)

## 2018-02-20 LAB — LIPID PANEL
CHOL/HDL RATIO: 5
Cholesterol: 188 mg/dL (ref 0–200)
HDL: 40.2 mg/dL (ref >39.00)
LDL CALC: 113 mg/dL — AB (ref 0–99)
NonHDL: 147.66
TRIGLYCERIDES: 174 mg/dL — AB (ref 0.0–149.0)
VLDL: 34.8 mg/dL (ref 0.0–40.0)

## 2018-02-20 LAB — HEMOGLOBIN A1C: Hgb A1c MFr Bld: 5.8 % (ref 4.6–6.5)

## 2018-02-20 LAB — TSH: TSH: 1.63 u[IU]/mL (ref 0.35–4.50)

## 2018-02-20 LAB — VITAMIN D 25 HYDROXY (VIT D DEFICIENCY, FRACTURES): VITD: 31.17 ng/mL (ref 30.00–100.00)

## 2018-02-20 LAB — HIV ANTIBODY (ROUTINE TESTING W REFLEX): HIV 1&2 Ab, 4th Generation: NONREACTIVE

## 2018-02-22 ENCOUNTER — Telehealth: Payer: Self-pay

## 2018-02-22 ENCOUNTER — Other Ambulatory Visit: Payer: Self-pay

## 2018-02-22 DIAGNOSIS — Q6689 Other  specified congenital deformities of feet: Secondary | ICD-10-CM

## 2018-02-22 NOTE — Telephone Encounter (Signed)
MRI approved from 02/15/18 to 03/15/18 Auth # [id number] Patient has been notified via voice mail to call main scheduling and set up her own appt

## 2018-02-22 NOTE — Telephone Encounter (Signed)
-----   Message from Felecia Shelling, DPM sent at 02/09/2018  2:02 PM EDT ----- Regarding: MRI left foot Please order MRI left foot w/out contrast.   Dx: tarsal coalition left  Thanks, Dr. Logan Bores

## 2018-02-26 ENCOUNTER — Encounter: Payer: Self-pay | Admitting: Podiatry

## 2018-03-09 ENCOUNTER — Ambulatory Visit: Payer: BC Managed Care – PPO | Admitting: Podiatry

## 2018-03-10 ENCOUNTER — Ambulatory Visit: Payer: BC Managed Care – PPO

## 2018-03-13 ENCOUNTER — Ambulatory Visit
Admission: RE | Admit: 2018-03-13 | Discharge: 2018-03-13 | Disposition: A | Discharge status: Home / Self Care | Payer: BC Managed Care – PPO | Source: Ambulatory Visit | Attending: Podiatry | Admitting: Podiatry

## 2018-03-13 DIAGNOSIS — Q6689 Other  specified congenital deformities of feet: Secondary | ICD-10-CM

## 2018-03-26 ENCOUNTER — Encounter: Payer: Self-pay | Admitting: Family

## 2018-03-26 ENCOUNTER — Other Ambulatory Visit: Payer: Self-pay | Admitting: Family

## 2018-03-26 DIAGNOSIS — E669 Obesity, unspecified: Secondary | ICD-10-CM

## 2018-03-27 ENCOUNTER — Ambulatory Visit: Payer: BC Managed Care – PPO | Admitting: Podiatry

## 2018-03-27 ENCOUNTER — Encounter: Payer: Self-pay | Admitting: Podiatry

## 2018-03-27 ENCOUNTER — Encounter: Payer: Self-pay | Admitting: Obstetrics and Gynecology

## 2018-03-27 DIAGNOSIS — Q6689 Other  specified congenital deformities of feet: Secondary | ICD-10-CM | POA: Diagnosis not present

## 2018-04-03 NOTE — Progress Notes (Signed)
   HPI: 32 year old female presenting today for follow up evaluation of tarsal coalition of the left foot. She reports continued pain that has not improved. She is not currently taking anything for pain because she just found out she is pregnant. She is here to discuss her MRI results. Patient is here for further evaluation and treatment.   Past Medical History:  Diagnosis Date  . Anxiety   . Asthma   . Bipolar disorder (HCC)   . Depression   . Frequent headaches   . History of gestational diabetes 2016  . Hyperlipidemia   . Migraines   . Obesity (BMI 35.0-39.9 without comorbidity) 12/24/2015  . Tarsal coalition of left foot   . UTI (urinary tract infection)   . Wears contact lenses      Physical Exam: General: The patient is alert and oriented x3 in no acute distress.  Dermatology: Skin is warm, dry and supple bilateral lower extremities. Negative for open lesions or macerations.  Vascular: Palpable pedal pulses bilaterally. No edema or erythema noted. Capillary refill within normal limits.  Neurological: Epicritic and protective threshold grossly intact bilaterally.   Musculoskeletal Exam: Limited ROM with inversion and eversion of the subtalar joint. Muscle strength 5/5 in all groups bilateral.   MRI Impression:  1. 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 This could be symptomatic. Consider further evaluation with CT. 2. The ankle tendons and ligaments appear normal. 3. No acute osseous findings.  Assessment: 1. Tarsal coalition left   Plan of Care:  1. Patient evaluated. MRI reviewed.  2. Patient just found out she is pregnant.  3. Patient would benefit from surgery at some point in the future.  4. Return to clinic as needed.   School Runner, broadcasting/film/videoteacher.      Felecia ShellingBrent M. Evans, DPM [facility]  Dr. Felecia ShellingBrent M. Evans, DPM    [address]                                        [place], [place] [id number]                Office [phone]  [phone]  Fax [phone]

## 2018-04-11 ENCOUNTER — Encounter: Payer: Self-pay | Admitting: Obstetrics and Gynecology

## 2018-04-13 ENCOUNTER — Other Ambulatory Visit: Payer: Self-pay

## 2018-04-13 MED ORDER — ONDANSETRON HCL 4 MG PO TABS: 4.0000 mg | ORAL_TABLET | Freq: Three times a day (TID) | ORAL | 0 refills | Status: DC | PRN

## 2018-04-17 ENCOUNTER — Encounter: Payer: Self-pay | Admitting: Obstetrics and Gynecology

## 2018-04-24 ENCOUNTER — Encounter: Payer: Self-pay | Admitting: Obstetrics and Gynecology

## 2018-04-24 ENCOUNTER — Ambulatory Visit: Payer: BC Managed Care – PPO | Admitting: Obstetrics and Gynecology

## 2018-04-24 VITALS — BP 106/68 | HR 90 | Ht 65.0 in | Wt 206.4 lb

## 2018-04-24 DIAGNOSIS — E669 Obesity, unspecified: Secondary | ICD-10-CM

## 2018-04-24 DIAGNOSIS — O34219 Maternal care for unspecified type scar from previous cesarean delivery: Secondary | ICD-10-CM | POA: Insufficient documentation

## 2018-04-24 DIAGNOSIS — Z8632 Personal history of gestational diabetes: Secondary | ICD-10-CM | POA: Diagnosis not present

## 2018-04-24 DIAGNOSIS — Z3491 Encounter for supervision of normal pregnancy, unspecified, first trimester: Secondary | ICD-10-CM

## 2018-04-24 DIAGNOSIS — O09299 Supervision of pregnancy with other poor reproductive or obstetric history, unspecified trimester: Secondary | ICD-10-CM | POA: Insufficient documentation

## 2018-04-24 DIAGNOSIS — Z98891 History of uterine scar from previous surgery: Secondary | ICD-10-CM

## 2018-04-24 DIAGNOSIS — N912 Amenorrhea, unspecified: Secondary | ICD-10-CM

## 2018-04-24 DIAGNOSIS — Z3201 Encounter for pregnancy test, result positive: Secondary | ICD-10-CM | POA: Diagnosis not present

## 2018-04-24 LAB — POCT URINE PREGNANCY: Preg Test, Ur: POSITIVE — AB

## 2018-04-24 NOTE — Progress Notes (Signed)
Pt is present today for confirmation of pregnancy. UPT positive.  

## 2018-04-24 NOTE — Patient Instructions (Signed)
WHAT OB PATIENTS CAN EXPECT   Confirmation of pregnancy and ultrasound ordered if medically indicated-[redacted] weeks gestation  New OB (NOB) intake with nurse and New OB (NOB) labs- [redacted] weeks gestation  New OB (NOB) physical examination with provider- 11/[redacted] weeks gestation  Flu vaccine-[redacted] weeks gestation  Anatomy scan-[redacted] weeks gestation  Glucose tolerance test, blood work to test for anemia, T-dap vaccine-[redacted] weeks gestation  Vaginal swabs/cultures-STD/Group B strep-[redacted] weeks gestation  Appointments every 4 weeks until 28 weeks  Every 2 weeks from 28 weeks until 36 weeks  Weekly visits from 36 weeks until delivery  Morning Sickness Morning sickness is when you feel sick to your stomach (nauseous) during pregnancy. You may feel sick to your stomach and throw up (vomit). You may feel sick in the morning, but you can feel this way any time of day. Some women feel very sick to their stomach and cannot stop throwing up (hyperemesis gravidarum). Follow these instructions at home:  Only take medicines as told by your doctor.  Take multivitamins as told by your doctor. Taking multivitamins before getting pregnant can stop or lessen the harshness of morning sickness.  Eat dry toast or unsalted crackers before getting out of bed.  Eat 5 to 6 small meals a day.  Eat dry and bland foods like rice and baked potatoes.  Do not drink liquids with meals. Drink between meals.  Do not eat greasy, fatty, or spicy foods.  Have someone cook for you if the smell of food causes you to feel sick or throw up.  If you feel sick to your stomach after taking prenatal vitamins, take them at night or with a snack.  Eat protein when you need a snack (nuts, yogurt, cheese).  Eat unsweetened gelatins for dessert.  Wear a bracelet used for sea sickness (acupressure wristband).  Go to a doctor that puts thin needles into certain body points (acupuncture) to improve how you feel.  Do not smoke.  Use a  humidifier to keep the air in your house free of odors.  Get lots of fresh air. Contact a doctor if:  You need medicine to feel better.  You feel dizzy or lightheaded.  You are losing weight. Get help right away if:  You feel very sick to your stomach and cannot stop throwing up.  You pass out (faint). This information is not intended to replace advice given to you by your health care provider. Make sure you discuss any questions you have with your health care provider. Document Released: 11/10/2004 Document Revised: 03/10/2016 Document Reviewed: 03/20/2013 Elsevier Interactive Patient Education  2017 [employer] First Trimester of Pregnancy The first trimester of pregnancy is from week 1 until the end of week 13 (months 1 through 3). During this time, your baby will begin to develop inside you. At 6-8 weeks, the eyes and face are formed, and the heartbeat can be seen on ultrasound. At the end of 12 weeks, all the baby's organs are formed. Prenatal care is all the medical care you receive before the birth of your baby. Make sure you get good prenatal care and follow all of your doctor's instructions. Follow these instructions at home: Medicines  Take over-the-counter and prescription medicines only as told by your doctor. Some medicines are safe and some medicines are not safe during pregnancy.  Take a prenatal vitamin that contains at least 600 micrograms (mcg) of folic acid.  If you have trouble pooping (constipation), take medicine that will make your stool soft (  stool softener) if your doctor approves. Eating and drinking  Eat regular, healthy meals.  Your doctor will tell you the amount of weight gain that is right for you.  Avoid raw meat and uncooked cheese.  If you feel sick to your stomach (nauseous) or throw up (vomit): ? Eat 4 or 5 small meals a day instead of 3 large meals. ? Try eating a few soda crackers. ? Drink liquids between meals instead of during  meals.  To prevent constipation: ? Eat foods that are high in fiber, like fresh fruits and vegetables, whole grains, and beans. ? Drink enough fluids to keep your pee (urine) clear or pale yellow. Activity  Exercise only as told by your doctor. Stop exercising if you have cramps or pain in your lower belly (abdomen) or low back.  Do not exercise if it is too hot, too humid, or if you are in a place of great height (high altitude).  Try to avoid standing for long periods of time. Move your legs often if you must stand in one place for a long time.  Avoid heavy lifting.  Wear low-heeled shoes. Sit and stand up straight.  You can have sex unless your doctor tells you not to. Relieving pain and discomfort  Wear a good support bra if your breasts are sore.  Take warm water baths (sitz baths) to soothe pain or discomfort caused by hemorrhoids. Use hemorrhoid cream if your doctor says it is okay.  Rest with your legs raised if you have leg cramps or low back pain.  If you have puffy, bulging veins (varicose veins) in your legs: ? Wear support hose or compression stockings as told by your doctor. ? Raise (elevate) your feet for 15 minutes, 3-4 times a day. ? Limit salt in your food. Prenatal care  Schedule your prenatal visits by the twelfth week of pregnancy.  Write down your questions. Take them to your prenatal visits.  Keep all your prenatal visits as told by your doctor. This is important. Safety  Wear your seat belt at all times when driving.  Make a list of emergency phone numbers. The list should include numbers for family, friends, the hospital, and police and fire departments. General instructions  Ask your doctor for a referral to a local prenatal class. Begin classes no later than at the start of month 6 of your pregnancy.  Ask for help if you need counseling or if you need help with nutrition. Your doctor can give you advice or tell you where to go for help.  Do  not use hot tubs, steam rooms, or saunas.  Do not douche or use tampons or scented sanitary pads.  Do not cross your legs for long periods of time.  Avoid all herbs and alcohol. Avoid drugs that are not approved by your doctor.  Do not use any tobacco products, including cigarettes, chewing tobacco, and electronic cigarettes. If you need help quitting, ask your doctor. You may get counseling or other support to help you quit.  Avoid cat litter boxes and soil used by cats. These carry germs that can cause birth defects in the baby and can cause a loss of your baby (miscarriage) or stillbirth.  Visit your dentist. At home, brush your teeth with a soft toothbrush. Be gentle when you floss. Contact a doctor if:  You are dizzy.  You have mild cramps or pressure in your lower belly.  You have a nagging pain in your belly area.    You continue to feel sick to your stomach, you throw up, or you have watery poop (diarrhea).  You have a bad smelling fluid coming from your vagina.  You have pain when you pee (urinate).  You have increased puffiness (swelling) in your face, hands, legs, or ankles. Get help right away if:  You have a fever.  You are leaking fluid from your vagina.  You have spotting or bleeding from your vagina.  You have very bad belly cramping or pain.  You gain or lose weight rapidly.  You throw up blood. It may look like coffee grounds.  You are around people who have [place] measles, fifth disease, or chickenpox.  You have a very bad headache.  You have shortness of breath.  You have any kind of trauma, such as from a fall or a car accident. Summary  The first trimester of pregnancy is from week 1 until the end of week 13 (months 1 through 3).  To take care of yourself and your unborn baby, you will need to eat healthy meals, take medicines only if your doctor tells you to do so, and do activities that are safe for you and your baby.  Keep all follow-up  visits as told by your doctor. This is important as your doctor will have to ensure that your baby is healthy and growing well. This information is not intended to replace advice given to you by your health care provider. Make sure you discuss any questions you have with your health care provider. Document Released: 03/21/2008 Document Revised: 10/11/2016 Document Reviewed: 10/11/2016 Elsevier Interactive Patient Education  2017 [employer]. Common Medications Safe in Pregnancy  Acne:      Constipation:  Benzoyl Peroxide     Colace  Clindamycin      Dulcolax Suppository  Topica Erythromycin     Fibercon  Salicylic Acid      Metamucil         Miralax AVOID:        Senakot   Accutane    Cough:  Retin-A       Cough Drops  Tetracycline      Phenergan w/ Codeine if Rx  Minocycline      Robitussin (Plain & DM)  Antibiotics:     Crabs/Lice:  Ceclor       RID  Cephalosporins    AVOID:  E-Mycins      Kwell  Keflex  Macrobid/Macrodantin   Diarrhea:  Penicillin      Kao-Pectate  Zithromax      Imodium AD         PUSH FLUIDS AVOID:       Cipro     Fever:  Tetracycline      Tylenol (Regular or Extra  Minocycline       Strength)  Levaquin      Extra Strength-Do not          Exceed 8 tabs/24 hrs Caffeine:        '200mg'$ /day (equiv. To 1 cup of coffee or  approx. 3 12 oz sodas)         Gas: Cold/Hayfever:       Gas-X  Benadryl      Mylicon  Claritin       Phazyme  **Claritin-D        Chlor-Trimeton    Headaches:  Dimetapp      ASA-Free Excedrin  Drixoral-Non-Drowsy     Cold Compress  Mucinex (Guaifenasin)     Tylenol (  Regular or Extra  Sudafed/Sudafed-12 Hour     Strength)  **Sudafed PE Pseudoephedrine   Tylenol Cold & Sinus     Vicks Vapor Rub  Zyrtec  **AVOID if Problems With Blood Pressure         Heartburn: Avoid lying down for at least 1 hour after meals  Aciphex      Maalox     Rash:  Milk of Magnesia     Benadryl    Mylanta       1% Hydrocortisone  Cream  Pepcid  Pepcid Complete   Sleep Aids:  Prevacid      Ambien   Prilosec       Benadryl  Rolaids       Chamomile Tea  Tums (Limit 4/day)     Unisom  Zantac       Tylenol PM         Warm milk-add vanilla or  Hemorrhoids:       Sugar for taste  Anusol/Anusol H.C.  (RX: Analapram 2.5%)  Sugar Substitutes:  Hydrocortisone OTC     Ok in moderation  Preparation H      Tucks        Vaseline lotion applied to tissue with wiping    Herpes:     Throat:  Acyclovir      Oragel  Famvir  Valtrex     Vaccines:         Flu Shot Leg Cramps:       *Gardasil  Benadryl      Hepatitis A         Hepatitis B Nasal Spray:       Pneumovax  Saline Nasal Spray     [place]io Booster         Tetanus Nausea:       Tuberculosis test or PPD  Vitamin B6 25 mg TID   AVOID:    Dramamine      *Gardasil  Emetrol       Live Poliovirus  Ginger Root 250 mg QID    MMR (measles, mumps &  High Complex Carbs @ Bedtime    rebella)  Sea Bands-Accupressure    Varicella (Chickenpox)  Unisom 1/2 tab TID     *No known complications           If received before Pain:         Known pregnancy;   Darvocet       Resume series after  Lortab        Delivery  Percocet    Yeast:   Tramadol      Femstat  Tylenol 3      Gyne-lotrimin  Ultram       Monistat  Vicodin           MISC:         All Sunscreens           Hair Coloring/highlights          Insect Repellant's          (Including DEET)         Mystic Tans

## 2018-04-24 NOTE — Progress Notes (Signed)
   GYNECOLOGY CLINIC PROGRESS NOTE Subjective:    Andrea Roberson is a 32 y.o. [id number] female who presents for evaluation of amenorrhea. She believes she could be pregnant. Pregnancy is desired. Sexual Activity: single partner, contraception: none. Current symptoms also include: fatigue, morning sickness, nausea and positive home pregnancy test. Last period was normal. Patient's last menstrual period was 02/22/2018.    The following portions of the patient's history were reviewed and updated as appropriate: allergies, current medications, past family history, past medical history, past social history, past surgical history and problem list.  Review of Systems Pertinent items noted in HPI and remainder of comprehensive ROS otherwise negative.     Objective:    BP 106/68   Pulse 90   Ht 5\' 5"  (1.651 m)   Wt 206 lb 6.4 oz (93.6 kg)   LMP 02/22/2018   BMI 34.35 kg/m  General: alert, no distress and no acute distress    Lab Review Urine HCG: positive    Assessment:    Absence of menstruation.    Obesity H/o gestational diabetes H/o C-section x 1  Plan:   - Pregnancy Test: Positive: EDC: 11/29/2017, EGA 8.5 weeks. Briefly discussed pre-natal care options. Pregnancy, Childbirth and the Newborn book given. Encouraged well-balanced diet, plenty of rest when needed, pre-natal vitamins daily and walking for exercise. Discussed self-help for nausea, avoiding OTC medications until consulting provider or pharmacist, other than Tylenol as needed, minimal caffeine (1-2 cups daily) and avoiding alcohol. She will schedule her initial OB visit in the next month, and will have her NOB intake in 1-2 weeks. Feel free to call with any questions.  - Patient notes that her PCP recently diagnosed with her pre-diabetes.  She is scheduled to see a Nutritionist this month. Encouraged good compliance with diet in light of prior GDM history, will need early testing during pregnancy.  - Patient with prior h/o  C-section x 1, will discuss TOLAC vs repeat C-section during the course of prenatal care.     Hildred Laserherry, Iley Deignan, MD [facility]

## 2018-05-04 ENCOUNTER — Encounter: Payer: BC Managed Care – PPO | Attending: Family | Admitting: Dietician

## 2018-05-04 ENCOUNTER — Encounter: Payer: Self-pay | Admitting: Dietician

## 2018-05-04 VITALS — Ht 65.0 in | Wt 206.6 lb

## 2018-05-04 DIAGNOSIS — Z6834 Body mass index (BMI) 34.0-34.9, adult: Secondary | ICD-10-CM | POA: Diagnosis not present

## 2018-05-04 DIAGNOSIS — E6609 Other obesity due to excess calories: Secondary | ICD-10-CM

## 2018-05-04 DIAGNOSIS — R7303 Prediabetes: Secondary | ICD-10-CM

## 2018-05-04 NOTE — Patient Instructions (Addendum)
   Look for drink-able yogurts rather than go-gurts for a lower-sugar yogurt option  Overall diet: high in protein, moderate in starch (complex carbs are best), low in fat with focus on heart-healthy fats. Use the plate method for planning meals: where 1/2 plate is vegetables, 1/4 plate is protein, 1/4 plate is starch  4g or greater on a nutrition facts label= high fiber, 10g or less for sugar and fat = low fat/ sugar option  Consider making smoothies / protein shakes in the morning for breakfast options, and include whole grains like oats or whole grain toast for longer-lasting energy  Make protein more of a priority at meal times. At least 3oz/15g per meal  Sources of protein include: whey protein, [place], eggs, beans, legumes, edamame,soy beans, seafood/fish, lean beef, dairy products (yogurt (greek is higher), milk, cheese), pork chop / loin, nut butter

## 2018-05-04 NOTE — Progress Notes (Signed)
Medical Nutrition Therapy: Visit start time: 1330  end time: 1430  Assessment:  Diagnosis: obesity, prediabetes Past medical history: Bipolar disorder, IBS, Gestational diabetes Psychosocial issues/ stress concerns: None reported  Preferred learning method:  . Auditory . Visual  Current weight: 208.6#  Height: 5\' 5"  Medications, supplements: Prozac, Prenatal vitamin, see chart  Progress and evaluation: Pt with h/o gestational DM and recently dx prediabetes just prior to current pregnancy. Reports wt gain of 100# over the course of her first pregnancy which she had been trying to lose when she again became pregnant. Wt last year 179# per her report. Wishes to learn about healthier eating options to avoid developing type II DM, gestational DM, and excessive wt gain during pregnancy. Since becoming pregnant she has been experiencing frequent nausea, lethargy and food aversions. Her dietary preferences and energy levels have changed significantly, leading to less- nutritious food choices. Has been eating more fast foods and take-out options than is typical for her, and is eating less fruits and vegetables than normal. She has also been less active. Reports that eating enough protein has always been a struggle for her, and now is even more difficult d/t chicken and eggs making her stomach turn. She does not typically snack between meals and uses olive oil when cooking.   Physical activity: Cardio and weights 1-2x/wk for each session  Dietary Intake:  Usual eating pattern includes 2-3 meals and 1-2 snacks per day. Dining out frequency: 6-9 meals per week.  Breakfast: before pregnancy: protein shake with gold standard whey + almond milk, currently: fast food (biscuit & gravy from [employer], steak biscuit from [employer]), grits, banana occasionally Snack: none Lunch: before pregnancy: salad, nuts, fruit, yogurt. Has been skipping since becoming pregnant d/t nausea Snack: yogurt, bowl of  cereal Supper: before pregnancy she would meal prep weekly: [place] burgers, chicken, broccoli, green beans, sweet potatoes. During pregnancy: more convenience foods / eating out. She and her husband started to order pre-made / portioned meals from [employer] starting last week Snack: yogurt occasionally Beverages: water, 1 Gatorade per day, apple juice  Nutrition Care Education: Topics covered: plate method, dietary sources of folate / calcium / iron / protein, ways to increase dietary fiber intake, IBS and high-fat/ greasy meals, recommended rate of weight gain during pregnancy based on BMI, planning balanced meals, snack options Basic nutrition: basic food groups, appropriate nutrient balance, appropriate meal and snack schedule, general nutrition guidelines during pregnancy/ after pregnancy    Weight control: identifying healthy rate of weight gain during pregnancy based on pre-pregnancy BMI/wt, determining reasonable weight goal, behavioral changes for weight loss post-pregnancy Advanced nutrition: cooking techniques, dining out, food label reading Other lifestyle changes: benefits of making changes, increasing motivation, readiness for change, identifying habits that need to change  Nutritional Diagnosis:  [place]-3.3 Overweight/obesity As related to lifestyle habits, significant wt gain during first pregnancy.  As evidenced by BMI 34.38, patient report of decreased physical activity and change in dietary habits while pregnant.  Intervention: Discussion as noted above. She will work to stay within the recommended weight gain guidelines provided per her BMI while pregnant, with the ultimate goal of weight loss post-pregnancy. She plans to breast feed which should help this process. During pregnancy, she will work to include more sources of whole foods, proteins and fruits, as well as try not to skip meals; working around her nausea and food aversions. She is also considering increasing her daily fiber  intake.   Education Materials given:  .  Healthy eating during pregnancy handout . Goals/ instructions  Learner/ who was taught:  . Patient  Level of understanding: Marland Kitchen. Verbalizes/ demonstrates competency  Demonstrated degree of understanding via:   Teach back Learning barriers: . None  Willingness to learn/ readiness for change: . Acceptance, ready for change  Monitoring and Evaluation:  Dietary intake, exercise, and body weight      follow up: prn

## 2018-05-08 ENCOUNTER — Other Ambulatory Visit: Payer: Self-pay | Admitting: Obstetrics and Gynecology

## 2018-05-08 ENCOUNTER — Ambulatory Visit (INDEPENDENT_AMBULATORY_CARE_PROVIDER_SITE_OTHER): Payer: BC Managed Care – PPO

## 2018-05-08 ENCOUNTER — Ambulatory Visit (INDEPENDENT_AMBULATORY_CARE_PROVIDER_SITE_OTHER): Payer: BC Managed Care – PPO | Admitting: Obstetrics and Gynecology

## 2018-05-08 VITALS — BP 102/68 | HR 88 | Ht 65.0 in | Wt 210.2 lb

## 2018-05-08 DIAGNOSIS — O3411 Maternal care for benign tumor of corpus uteri, first trimester: Secondary | ICD-10-CM

## 2018-05-08 DIAGNOSIS — Z8632 Personal history of gestational diabetes: Secondary | ICD-10-CM

## 2018-05-08 DIAGNOSIS — N926 Irregular menstruation, unspecified: Secondary | ICD-10-CM

## 2018-05-08 DIAGNOSIS — Z3A10 10 weeks gestation of pregnancy: Secondary | ICD-10-CM | POA: Diagnosis not present

## 2018-05-08 DIAGNOSIS — Z3481 Encounter for supervision of other normal pregnancy, first trimester: Secondary | ICD-10-CM

## 2018-05-08 DIAGNOSIS — Z3201 Encounter for pregnancy test, result positive: Secondary | ICD-10-CM

## 2018-05-08 DIAGNOSIS — N8311 Corpus luteum cyst of right ovary: Secondary | ICD-10-CM | POA: Diagnosis not present

## 2018-05-08 DIAGNOSIS — Z3491 Encounter for supervision of normal pregnancy, unspecified, first trimester: Secondary | ICD-10-CM

## 2018-05-08 NOTE — Progress Notes (Signed)
,  Andrea Roberson presents for NOB nurse interview visit. Pregnancy confirmation done here at [facility].   G-2 .  P-  1  . Pregnancy education material explained and given. _No cats in the home. NOB labs ordered. (TSH/HbgA1c due to Increased BMI), (sickle cell). HIV labs and Drug screen were explained optional and she did not decline. Drug screen ordered. PNV encouraged. Genetic screening options discussed. Genetic testing:/Unsure.  Pt may discuss with provider. Pt. To follow up with provider in _1.5 _ weeks for NOB physical.  All questions answered.

## 2018-05-08 NOTE — Progress Notes (Signed)
I have reviewed the record and concur with patient management and plan.  Neftali Thurow, MD [facility]     

## 2018-05-08 NOTE — Patient Instructions (Signed)
First Trimester of Pregnancy The first trimester of pregnancy is from week 1 until the end of week 13 (months 1 through 3). During this time, your baby will begin to develop inside you. At 6-8 weeks, the eyes and face are formed, and the heartbeat can be seen on ultrasound. At the end of 12 weeks, all the baby's organs are formed. Prenatal care is all the medical care you receive before the birth of your baby. Make sure you get good prenatal care and follow all of your doctor's instructions. Follow these instructions at home: Medicines  Take over-the-counter and prescription medicines only as told by your doctor. Some medicines are safe and some medicines are not safe during pregnancy.  Take a prenatal vitamin that contains at least 600 micrograms (mcg) of folic acid.  If you have trouble pooping (constipation), take medicine that will make your stool soft (stool softener) if your doctor approves. Eating and drinking  Eat regular, healthy meals.  Your doctor will tell you the amount of weight gain that is right for you.  Avoid raw meat and uncooked cheese.  If you feel sick to your stomach (nauseous) or throw up (vomit): ? Eat 4 or 5 small meals a day instead of 3 large meals. ? Try eating a few soda crackers. ? Drink liquids between meals instead of during meals.  To prevent constipation: ? Eat foods that are high in fiber, like fresh fruits and vegetables, whole grains, and beans. ? Drink enough fluids to keep your pee (urine) clear or pale yellow. Activity  Exercise only as told by your doctor. Stop exercising if you have cramps or pain in your lower belly (abdomen) or low back.  Do not exercise if it is too hot, too humid, or if you are in a place of great height (high altitude).  Try to avoid standing for long periods of time. Move your legs often if you must stand in one place for a long time.  Avoid heavy lifting.  Wear low-heeled shoes. Sit and stand up straight.  You  can have sex unless your doctor tells you not to. Relieving pain and discomfort  Wear a good support bra if your breasts are sore.  Take warm water baths (sitz baths) to soothe pain or discomfort caused by hemorrhoids. Use hemorrhoid cream if your doctor says it is okay.  Rest with your legs raised if you have leg cramps or low back pain.  If you have puffy, bulging veins (varicose veins) in your legs: ? Wear support hose or compression stockings as told by your doctor. ? Raise (elevate) your feet for 15 minutes, 3-4 times a day. ? Limit salt in your food. Prenatal care  Schedule your prenatal visits by the twelfth week of pregnancy.  Write down your questions. Take them to your prenatal visits.  Keep all your prenatal visits as told by your doctor. This is important. Safety  Wear your seat belt at all times when driving.  Make a list of emergency phone numbers. The list should include numbers for family, friends, the hospital, and police and fire departments. General instructions  Ask your doctor for a referral to a local prenatal class. Begin classes no later than at the start of month 6 of your pregnancy.  Ask for help if you need counseling or if you need help with nutrition. Your doctor can give you advice or tell you where to go for help.  Do not use hot tubs, steam rooms, or   saunas.  Do not douche or use tampons or scented sanitary pads.  Do not cross your legs for long periods of time.  Avoid all herbs and alcohol. Avoid drugs that are not approved by your doctor.  Do not use any tobacco products, including cigarettes, chewing tobacco, and electronic cigarettes. If you need help quitting, ask your doctor. You may get counseling or other support to help you quit.  Avoid cat litter boxes and soil used by cats. These carry germs that can cause birth defects in the baby and can cause a loss of your baby (miscarriage) or stillbirth.  Visit your dentist. At home, brush  your teeth with a soft toothbrush. Be gentle when you floss. Contact a doctor if:  You are dizzy.  You have mild cramps or pressure in your lower belly.  You have a nagging pain in your belly area.  You continue to feel sick to your stomach, you throw up, or you have watery poop (diarrhea).  You have a bad smelling fluid coming from your vagina.  You have pain when you pee (urinate).  You have increased puffiness (swelling) in your face, hands, legs, or ankles. Get help right away if:  You have a fever.  You are leaking fluid from your vagina.  You have spotting or bleeding from your vagina.  You have very bad belly cramping or pain.  You gain or lose weight rapidly.  You throw up blood. It may look like coffee grounds.  You are around people who have German measles, fifth disease, or chickenpox.  You have a very bad headache.  You have shortness of breath.  You have any kind of trauma, such as from a fall or a car accident. Summary  The first trimester of pregnancy is from week 1 until the end of week 13 (months 1 through 3).  To take care of yourself and your unborn baby, you will need to eat healthy meals, take medicines only if your doctor tells you to do so, and do activities that are safe for you and your baby.  Keep all follow-up visits as told by your doctor. This is important as your doctor will have to ensure that your baby is healthy and growing well. This information is not intended to replace advice given to you by your health care provider. Make sure you discuss any questions you have with your health care provider. Document Released: 03/21/2008 Document Revised: 10/11/2016 Document Reviewed: 10/11/2016 Elsevier Interactive Patient Education  2017 [employer]  

## 2018-05-09 LAB — URINALYSIS, ROUTINE W REFLEX MICROSCOPIC
Bilirubin, UA: NEGATIVE
Glucose, UA: NEGATIVE
Ketones, UA: NEGATIVE
LEUKOCYTES UA: NEGATIVE
Nitrite, UA: NEGATIVE
Protein, UA: NEGATIVE
RBC UA: NEGATIVE
Specific Gravity, UA: 1.005 (ref 1.005–1.030)
Urobilinogen, Ur: 0.2 mg/dL (ref 0.2–1.0)
pH, UA: 6.5 (ref 5.0–7.5)

## 2018-05-09 LAB — MONITOR DRUG PROFILE 14(MW)
AMPHETAMINE SCREEN URINE: NEGATIVE ng/mL
BARBITURATE SCREEN URINE: NEGATIVE ng/mL
BENZODIAZEPINE SCREEN, URINE: NEGATIVE ng/mL
Buprenorphine, Urine: NEGATIVE ng/mL
CANNABINOIDS UR QL SCN: NEGATIVE ng/mL
Cocaine (Metab) Scrn, Ur: NEGATIVE ng/mL
Creatinine(Crt), U: 31.3 mg/dL (ref 20.0–300.0)
Fentanyl, Urine: NEGATIVE pg/mL
Meperidine Screen, Urine: NEGATIVE ng/mL
Methadone Screen, Urine: NEGATIVE ng/mL
OXYCODONE+OXYMORPHONE UR QL SCN: NEGATIVE ng/mL
Opiate Scrn, Ur: NEGATIVE ng/mL
PH UR, DRUG SCRN: 6.6 (ref 4.5–8.9)
PROPOXYPHENE SCREEN URINE: NEGATIVE ng/mL
Phencyclidine Qn, Ur: NEGATIVE ng/mL
SPECIFIC GRAVITY: 1.005
Tramadol Screen, Urine: NEGATIVE ng/mL

## 2018-05-09 LAB — CBC WITH DIFFERENTIAL/PLATELET
BASOS: 0 %
Basophils Absolute: 0 10*3/uL (ref 0.0–0.2)
EOS (ABSOLUTE): 0.2 10*3/uL (ref 0.0–0.4)
Eos: 2 %
Hematocrit: 36.4 % (ref 34.0–46.6)
Hemoglobin: 12.5 g/dL (ref 11.1–15.9)
IMMATURE GRANS (ABS): 0.1 10*3/uL (ref 0.0–0.1)
IMMATURE GRANULOCYTES: 1 %
LYMPHS: 27 %
Lymphocytes Absolute: 2.9 10*3/uL (ref 0.7–3.1)
MCH: 28.9 pg (ref 26.6–33.0)
MCHC: 34.3 g/dL (ref 31.5–35.7)
MCV: 84 fL (ref 79–97)
MONOS ABS: 0.6 10*3/uL (ref 0.1–0.9)
Monocytes: 6 %
NEUTROS PCT: 64 %
Neutrophils Absolute: 6.9 10*3/uL (ref 1.4–7.0)
PLATELETS: 269 10*3/uL (ref 150–450)
RBC: 4.32 x10E6/uL (ref 3.77–5.28)
RDW: 14.3 % (ref 12.3–15.4)
WBC: 10.6 10*3/uL (ref 3.4–10.8)

## 2018-05-09 LAB — SICKLE CELL SCREEN: Sickle Cell Screen: NEGATIVE

## 2018-05-09 LAB — GC/CHLAMYDIA PROBE AMP
Chlamydia trachomatis, NAA: NEGATIVE
Neisseria gonorrhoeae by PCR: NEGATIVE

## 2018-05-09 LAB — HIV ANTIBODY (ROUTINE TESTING W REFLEX): HIV SCREEN 4TH GENERATION: NONREACTIVE

## 2018-05-09 LAB — RPR: RPR: NONREACTIVE

## 2018-05-09 LAB — ABO AND RH: Rh Factor: POSITIVE

## 2018-05-09 LAB — HEMOGLOBIN A1C
Est. average glucose Bld gHb Est-mCnc: 114 mg/dL
Hgb A1c MFr Bld: 5.6 % (ref 4.8–5.6)

## 2018-05-09 LAB — HEPATITIS B SURFACE ANTIGEN: HEP B S AG: NEGATIVE

## 2018-05-09 LAB — VARICELLA ZOSTER ANTIBODY, IGG: Varicella zoster IgG: 220 index (ref >165)

## 2018-05-09 LAB — TSH: TSH: 1.18 u[IU]/mL (ref 0.450–4.500)

## 2018-05-09 LAB — RUBELLA SCREEN: RUBELLA: 1.2 {index} (ref >0.99)

## 2018-05-10 LAB — URINE CULTURE

## 2018-05-16 ENCOUNTER — Ambulatory Visit (INDEPENDENT_AMBULATORY_CARE_PROVIDER_SITE_OTHER): Payer: BC Managed Care – PPO | Admitting: Obstetrics and Gynecology

## 2018-05-16 VITALS — BP 96/67 | HR 93 | Ht 65.0 in | Wt 212.0 lb

## 2018-05-16 DIAGNOSIS — O9989 Other specified diseases and conditions complicating pregnancy, childbirth and the puerperium: Secondary | ICD-10-CM

## 2018-05-16 DIAGNOSIS — Z3A11 11 weeks gestation of pregnancy: Secondary | ICD-10-CM

## 2018-05-16 DIAGNOSIS — R8290 Unspecified abnormal findings in urine: Secondary | ICD-10-CM

## 2018-05-16 DIAGNOSIS — Z8632 Personal history of gestational diabetes: Secondary | ICD-10-CM

## 2018-05-16 DIAGNOSIS — E669 Obesity, unspecified: Secondary | ICD-10-CM

## 2018-05-16 DIAGNOSIS — R102 Pelvic and perineal pain: Secondary | ICD-10-CM

## 2018-05-16 DIAGNOSIS — O09291 Supervision of pregnancy with other poor reproductive or obstetric history, first trimester: Secondary | ICD-10-CM

## 2018-05-16 DIAGNOSIS — F3181 Bipolar II disorder: Secondary | ICD-10-CM

## 2018-05-16 DIAGNOSIS — O34219 Maternal care for unspecified type scar from previous cesarean delivery: Secondary | ICD-10-CM

## 2018-05-16 DIAGNOSIS — Z3481 Encounter for supervision of other normal pregnancy, first trimester: Secondary | ICD-10-CM

## 2018-05-16 DIAGNOSIS — O99211 Obesity complicating pregnancy, first trimester: Secondary | ICD-10-CM

## 2018-05-16 DIAGNOSIS — O26899 Other specified pregnancy related conditions, unspecified trimester: Secondary | ICD-10-CM

## 2018-05-16 LAB — POCT URINALYSIS DIPSTICK
BILIRUBIN UA: NEGATIVE
Blood, UA: NEGATIVE
Glucose, UA: NEGATIVE
Ketones, UA: NEGATIVE
Leukocytes, UA: NEGATIVE
NITRITE UA: NEGATIVE
PROTEIN UA: NEGATIVE
UROBILINOGEN UA: 0.2 U/dL
pH, UA: 6.5 (ref 5.0–8.0)

## 2018-05-16 NOTE — Progress Notes (Signed)
OBSTETRIC INITIAL PRENATAL VISIT  Subjective:    Andrea Roberson is being seen today for her first obstetrical visit.  This is not a planned pregnancy. She is a G2P1001 female at [redacted]w[redacted]d gestation, Estimated Date of Delivery: 11/30/18 with Patient's last menstrual period was 02/22/2018 (consistent with 10 week sono). Her obstetrical history is significant for gestational diabetes in prior pregnancy, requiring insulin, h/o prior C-section x 1 and moderate obesity. Relationship with FOB: spouse, living together. Patient does intend to breast feed. Pregnancy history fully reviewed.    OB History  Gravida Para Term Preterm AB Living  2 1 1  0 0 1  SAB TAB Ectopic Multiple Live Births  0 0 0 0 1    # Outcome Date GA Lbr Len/2nd Weight Sex Delivery Anes PTL Lv  2 Current           1 Term 05/02/14 [redacted]w[redacted]d  7 lb 8 oz (3.402 kg) M CS-LTranv Spinal, EPI N LIV     Complications: Gestational diabetes mellitus in childbirth, insulin controlled, Excessive weight gain in pregnancy     Name: Alan Mulder     Apgar1: 8  Apgar5: 9    Gynecologic History:  Last pap smear was 03/2017.  Results were normal.  Denies h/o abnormal pap smears in the past.  Denies history of STIs.  Contraception: none   Past Medical History:  Diagnosis Date  . Anxiety   . Asthma   . Bipolar disorder (HCC)   . Depression   . Frequent headaches   . History of gestational diabetes 2016  . Hyperlipidemia   . Migraines   . Obesity (BMI 35.0-39.9 without comorbidity) 12/24/2015  . Tarsal coalition of left foot   . UTI (urinary tract infection)   . Wears contact lenses      Family History  Problem Relation Age of Onset  . Heart disease Mother   . Diabetes Mother   . Anxiety disorder Mother   . Arthritis Mother   . Heart disease Father   . Diabetes Father   . Skin cancer Father   . Arthritis Father   . Colon polyps Father   . Peptic Ulcer Disease Father   . Colon polyps Brother   . Colon cancer Paternal Aunt   . Heart  disease Maternal Grandmother   . Colon cancer Paternal Grandfather      Past Surgical History:  Procedure Laterality Date  . CESAREAN SECTION    . ESOPHAGOGASTRODUODENOSCOPY (EGD) WITH PROPOFOL N/A 08/30/2017   Procedure: ESOPHAGOGASTRODUODENOSCOPY (EGD) WITH PROPOFOL;  Surgeon: Toney Reil, MD;  Location: [facility];  Service: Endoscopy;  Laterality: N/A;  . WISDOM TOOTH EXTRACTION       Social History   Socioeconomic History  . Marital status: Married    Spouse name: Not on file  . Number of children: Not on file  . Years of education: Not on file  . Highest education level: Not on file  Occupational History  . Not on file  Social Needs  . Financial resource strain: Not on file  . Food insecurity:    Worry: Not on file    Inability: Not on file  . Transportation needs:    Medical: Not on file    Non-medical: Not on file  Tobacco Use  . Smoking status: Former Smoker    Last attempt to quit: 09/16/2012    Years since quitting: 5.6  . Smokeless tobacco: Never Used  Substance and Sexual Activity  . Alcohol  use: Not Currently    Comment: 2 glasswine/month  . Drug use: No  . Sexual activity: Yes    Birth control/protection: None  Lifestyle  . Physical activity:    Days per week: Not on file    Minutes per session: Not on file  . Stress: Not on file  Relationships  . Social connections:    Talks on phone: Not on file    Gets together: Not on file    Attends religious service: Not on file    Active member of club or organization: Not on file    Attends meetings of clubs or organizations: Not on file    Relationship status: Not on file  . Intimate partner violence:    Fear of current or ex partner: Not on file    Emotionally abused: Not on file    Physically abused: Not on file    Forced sexual activity: Not on file  Other Topics Concern  . Not on file  Social History Narrative   Married   1 boy   [employer], broadcasting/film/videoTeacher- pre K        Current Outpatient  Medications on File Prior to Visit  Medication Sig Dispense Refill  . cetirizine-pseudoephedrine (ZYRTEC-D) 5-120 MG tablet Take by mouth.    Marland Kitchen. FLUoxetine (PROZAC) 20 MG capsule Take 3 capsules (60 mg total) by mouth daily. 90 capsule 3  . fluticasone (FLONASE) 50 MCG/ACT nasal spray Place into the nose.    . Prenatal Vit-Fe Fumarate-FA (PRENATAL MULTIVITAMIN) TABS tablet Take 1 tablet by mouth daily at 12 noon.    . meloxicam (MOBIC) 15 MG tablet TK 1 T PO QD  0  . Omega-3 Fatty Acids (FISH OIL) 1000 MG CAPS Take by mouth.    . ondansetron (ZOFRAN) 4 MG tablet Take 1 tablet (4 mg total) by mouth every 8 (eight) hours as needed for nausea or vomiting. (Patient not taking: Reported on 05/04/2018) 30 tablet 0   No current facility-administered medications on file prior to visit.      No Known Allergies    Review of Systems General:Not Present- Fever, Weight Loss and Weight Gain. Skin:Not Present- Rash. HEENT:Not Present- Blurred Vision, Headache and Bleeding Gums. Respiratory:Not Present- Difficulty Breathing. Breast:Not Present- Breast Mass. Cardiovascular:Not Present- Chest Pain, Elevated Blood Pressure, Fainting / Blacking Out and Shortness of Breath. Gastrointestinal:Not Present- Abdominal Pain, Constipation, Nausea and Vomiting. Female Genitourinary:Present - Pelvic cramping. Urinary odor. Not Present- Frequency, Painful Urination, Pelvic Pain, Vaginal Bleeding, Vaginal Discharge, Contractions, regular, Fetal Movements Decreased, Urinary Complaints and Vaginal Fluid. Musculoskeletal:Not Present- Back Pain and Leg Cramps. Neurological:Present - Occasional lightheadedness and dizziness. Not Present- Headaches, syncope. Psychiatric:Not Present- Depression.     Objective:   Blood pressure 96/67, pulse 93, weight 212 lb (96.2 kg), last menstrual period 02/22/2018.  Body mass index is 35.28 kg/m.  General Appearance:    Alert, cooperative, no distress, appears stated age    Head:    Normocephalic, without obvious abnormality, atraumatic  Eyes:    PERRL, conjunctiva/corneas clear, EOM's intact, both eyes  Ears:    Normal external ear canals, both ears  Nose:   Nares normal, septum midline, mucosa normal, no drainage or sinus tenderness  Throat:   Lips, mucosa, and tongue normal; teeth and gums normal  Neck:   Supple, symmetrical, trachea midline, no adenopathy; thyroid: no enlargement/tenderness/nodules; no carotid bruit or JVD  Back:     Symmetric, no curvature, ROM normal, no CVA tenderness  Lungs:  Clear to auscultation bilaterally, respirations unlabored  Chest Wall:    No tenderness or deformity   Heart:    Regular rate and rhythm, S1 and S2 normal, no murmur, rub or gallop  Breast Exam:    No tenderness, masses, or nipple abnormality  Abdomen:     Soft, non-tender, bowel sounds active all four quadrants, no masses, no organomegaly.  Well-healed Pfannenstiel incision. FH 12.  FHT 162 bpm.  Genitalia:    Pelvic:external genitalia normal, vagina without lesions, discharge, or tenderness, rectovaginal septum  normal. Cervix normal in appearance, no cervical motion tenderness, no adnexal masses or tenderness.  Pregnancy positive findings: uterine enlargement: 12 wk size, nontender.   Rectal:    Normal external sphincter.  No hemorrhoids appreciated. Internal exam not done.   Extremities:   Extremities normal, atraumatic, no cyanosis or edema  Pulses:   2+ and symmetric all extremities  Skin:   Skin color, texture, turgor normal, no rashes or lesions  Lymph nodes:   Cervical, supraclavicular, and axillary nodes normal  Neurologic:   CNII-XII intact, normal strength, sensation and reflexes throughout     Assessment:   Pregnancy at 11 and 5/7 weeks  History of gestational diabetes in prior pregnancy, currently pregnant in first trimester Obesity (BMI 35.0-39.9 without comorbidity) History of cesarean delivery affecting pregnancy Pelvic cramping in  antepartum period Abnormal urine odor H/o bipolar disorder   Plan:    Initial labs reviewed. Prenatal vitamins encouraged. Problem list reviewed and updated. New OB counseling:  The patient has been given an overview regarding routine prenatal care.  Recommendations regarding diet, weight gain, and exercise in pregnancy were given. Prenatal testing, optional genetic testing, and ultrasound use in pregnancy were reviewed. Patient undecided, will check with insurance company to see what is covered. Deciding between Panorama and 2nd trimester screen. Benefits of Breast Feeding were discussed. The patient is encouraged to consider nursing her baby post partum. Advised on Tylenol prn for cramping.  Advised to discontinue Mobic if she has not already done so.  H/o GDM requiring insulin in prior pregnancy. Will need early glucola, and begin baby aspirin after 12 weeks.  H/o prior C-section x 1, failure to progress.  Discussed repeat C-section vs TOLAC, patient undecided. VBAC calculated score is 30.3%. Advised that based on score recommendations would be for repeat C-section. To patient to discuss with her husband regarding recommendations.  Abnormal urine odor, but UA today wnl.  May be dietary. Encouraged increasing hydration.  H/o bipolar disorder, on no meds. Continue to monitor.  Follow up in 4 weeks.  50% of 30 min visit spent on counseling and coordination of care.     Hildred Laser, MD [facility]

## 2018-05-16 NOTE — Progress Notes (Signed)
Andrea Roberson is present today for annual exam. Pt stated that she is having cramps in her back and lower pelvic. Pt stated being lightheaded and dizzy at times. Pt stated that she thinks her urine smells bad along with discharge odor.

## 2018-05-17 ENCOUNTER — Encounter: Payer: Self-pay | Admitting: Obstetrics and Gynecology

## 2018-05-21 ENCOUNTER — Encounter: Payer: Self-pay | Admitting: Obstetrics and Gynecology

## 2018-05-23 ENCOUNTER — Ambulatory Visit: Payer: BC Managed Care – PPO | Admitting: Family

## 2018-06-13 ENCOUNTER — Telehealth: Payer: Self-pay | Admitting: Family

## 2018-06-13 ENCOUNTER — Ambulatory Visit (INDEPENDENT_AMBULATORY_CARE_PROVIDER_SITE_OTHER): Payer: BC Managed Care – PPO | Admitting: Obstetrics and Gynecology

## 2018-06-13 ENCOUNTER — Encounter: Payer: Self-pay | Admitting: Obstetrics and Gynecology

## 2018-06-13 ENCOUNTER — Other Ambulatory Visit: Payer: BC Managed Care – PPO

## 2018-06-13 VITALS — BP 115/78 | HR 92 | Wt 221.0 lb

## 2018-06-13 DIAGNOSIS — O09291 Supervision of pregnancy with other poor reproductive or obstetric history, first trimester: Secondary | ICD-10-CM

## 2018-06-13 DIAGNOSIS — Z8632 Personal history of gestational diabetes: Principal | ICD-10-CM

## 2018-06-13 DIAGNOSIS — Z3482 Encounter for supervision of other normal pregnancy, second trimester: Secondary | ICD-10-CM

## 2018-06-13 LAB — POCT URINALYSIS DIPSTICK OB
Bilirubin, UA: NEGATIVE
Blood, UA: NEGATIVE
GLUCOSE, UA: NEGATIVE
Ketones, UA: NEGATIVE
LEUKOCYTES UA: NEGATIVE
Nitrite, UA: NEGATIVE
POC,PROTEIN,UA: NEGATIVE
SPEC GRAV UA: 1.015 (ref 1.010–1.025)
Urobilinogen, UA: 0.2 E.U./dL
pH, UA: 6.5 (ref 5.0–8.0)

## 2018-06-13 NOTE — Progress Notes (Signed)
Pt presents today for routine prenatal care. Pt states she has slight cramping and back pain. Also, pt complains of slight dizziness and headaches.

## 2018-06-13 NOTE — Telephone Encounter (Signed)
Last office visit 02/19/18 Form placed in your folder

## 2018-06-13 NOTE — Progress Notes (Signed)
ROB: Doing 1 hour GCT today.  Has expressed interest in a repeat cesarean delivery.  Desires NIPPS testing genetic screen.  AFP today for spina bifida.  FAS ultrasound next visit.

## 2018-06-13 NOTE — Telephone Encounter (Signed)
Pt dropped off cpe form to be filled out. Placed in Arnett's color folder upfront. Please advise pt when completed

## 2018-06-14 LAB — GLUCOSE, 1 HOUR GESTATIONAL: Gestational Diabetes Screen: 102 mg/dL (ref 65–139)

## 2018-06-15 LAB — AFP, SERUM, OPEN SPINA BIFIDA
AFP MOM: 0.79
AFP Value: 17.6 ng/mL
GEST. AGE ON COLLECTION DATE: 15 wk
Maternal Age At EDD: 32.7 yr
OSBR RISK 1 IN: 10000
TEST RESULTS AFP: NEGATIVE
WEIGHT: 221 [lb_av]

## 2018-06-15 NOTE — Telephone Encounter (Signed)
Call pt  I had seen her in may and started her on prozac. Form for completion is in regards to being physically and emotionally fit.  I wanted her to return in 3 months. Please make an appt for her and I will complete form them when in office

## 2018-06-20 NOTE — Telephone Encounter (Signed)
Spoke with patient scheduled appointment for 06/27/18.  Advised patient that I was not sure visit would be covered she is aware due to form completion .

## 2018-06-27 ENCOUNTER — Ambulatory Visit: Payer: BC Managed Care – PPO | Admitting: Family

## 2018-06-27 ENCOUNTER — Other Ambulatory Visit: Payer: Self-pay | Admitting: Obstetrics and Gynecology

## 2018-06-27 DIAGNOSIS — Z3689 Encounter for other specified antenatal screening: Secondary | ICD-10-CM

## 2018-07-04 ENCOUNTER — Telehealth: Payer: Self-pay | Admitting: Obstetrics and Gynecology

## 2018-07-04 NOTE — Telephone Encounter (Signed)
The patient's husband called and stated that she is experiencing back and stomach pain, The husband wanted to know if the the pt could come into the office or go to the emergency room. The pt's husband also stated that the patient was ion a meeting and was not able to disclose the full description or symptoms/mdiscomfort. I spoke with a nurse FH and was advised to send patient to the ER. Pt's husband agreed and ended conversation pleasantly. Please advise,.

## 2018-07-09 ENCOUNTER — Ambulatory Visit: Payer: BC Managed Care – PPO | Admitting: Family

## 2018-07-11 ENCOUNTER — Ambulatory Visit (INDEPENDENT_AMBULATORY_CARE_PROVIDER_SITE_OTHER): Payer: BC Managed Care – PPO | Admitting: Obstetrics and Gynecology

## 2018-07-11 ENCOUNTER — Ambulatory Visit (INDEPENDENT_AMBULATORY_CARE_PROVIDER_SITE_OTHER): Payer: BC Managed Care – PPO

## 2018-07-11 VITALS — BP 101/67 | HR 93 | Wt 228.9 lb

## 2018-07-11 DIAGNOSIS — Z363 Encounter for antenatal screening for malformations: Secondary | ICD-10-CM

## 2018-07-11 DIAGNOSIS — Z3689 Encounter for other specified antenatal screening: Secondary | ICD-10-CM

## 2018-07-11 DIAGNOSIS — Z23 Encounter for immunization: Secondary | ICD-10-CM

## 2018-07-11 DIAGNOSIS — Z3482 Encounter for supervision of other normal pregnancy, second trimester: Secondary | ICD-10-CM

## 2018-07-11 DIAGNOSIS — Z8669 Personal history of other diseases of the nervous system and sense organs: Secondary | ICD-10-CM

## 2018-07-11 DIAGNOSIS — R42 Dizziness and giddiness: Secondary | ICD-10-CM

## 2018-07-11 LAB — POCT URINALYSIS DIPSTICK OB
Bilirubin, UA: NEGATIVE
Glucose, UA: NEGATIVE
Ketones, UA: NEGATIVE
Leukocytes, UA: NEGATIVE
NITRITE UA: NEGATIVE
PH UA: 6 (ref 5.0–8.0)
POC,PROTEIN,UA: NEGATIVE
RBC UA: NEGATIVE
UROBILINOGEN UA: 0.2 U/dL

## 2018-07-11 NOTE — Progress Notes (Signed)
ROB-Pt stated that she have been getting dizzy, lightheaded, headaches, lower back pain. Pt stated that her lower back pain everyday. Pt stated that her lightheaded, dizzy, headaches 2-3 times a week.

## 2018-07-11 NOTE — Progress Notes (Addendum)
ROB: Patient c/o lightheadedness, dizziness, and headaches.  Denies syncope. Does have a remote h/o migraines. Is taking Tylenol which helps some. Discussed other OTC remedies, increasing fluid intake, low BPs in second trimester.  Also offered Fioricet, however she declines at this time. Normal anatomy scan. Early glucola normal, will need repeat at 28 weeks. Flu vaccine given today. RTC in 4 weeks.

## 2018-07-17 ENCOUNTER — Other Ambulatory Visit: Payer: Self-pay

## 2018-07-17 ENCOUNTER — Observation Stay
Admission: EM | Admit: 2018-07-17 | Discharge: 2018-07-17 | Disposition: A | Discharge status: Home / Self Care | Payer: BC Managed Care – PPO | Attending: Obstetrics and Gynecology | Admitting: Obstetrics and Gynecology

## 2018-07-17 DIAGNOSIS — Z3A20 20 weeks gestation of pregnancy: Secondary | ICD-10-CM | POA: Insufficient documentation

## 2018-07-17 DIAGNOSIS — Z3482 Encounter for supervision of other normal pregnancy, second trimester: Secondary | ICD-10-CM | POA: Diagnosis not present

## 2018-07-17 LAB — URINALYSIS, COMPLETE (UACMP) WITH MICROSCOPIC
Bilirubin Urine: NEGATIVE
GLUCOSE, UA: NEGATIVE mg/dL
Hgb urine dipstick: NEGATIVE
KETONES UR: NEGATIVE mg/dL
LEUKOCYTES UA: NEGATIVE
NITRITE: NEGATIVE
PROTEIN: NEGATIVE mg/dL
SQUAMOUS EPITHELIAL / LPF: NONE SEEN (ref 0–5)
Specific Gravity, Urine: 1.002 — ABNORMAL LOW (ref 1.005–1.030)
pH: 7 (ref 5.0–8.0)

## 2018-07-17 NOTE — Discharge Instructions (Signed)
Call your OB office with any gushing of fluid, bleeding or decreased fetal movement. Make an appointment with your OB office this Thursday.

## 2018-07-17 NOTE — OB Triage Note (Signed)
Pt G2P1 presents to ED for leaking of fluid and lower abdominal pain. Denies vaginal bleeding or decrease in fetal movement. Nitrazine negative. Pt reported a large gush of fluids that ran down her legs at home that started at 1645. None noted now.

## 2018-07-19 NOTE — Discharge Summary (Signed)
    L&D OB Triage Note  SUBJECTIVE Andrea Roberson is a 32 y.o. G2P1001 female at 103w6d, EDD Estimated Date of Delivery: 11/30/18 who presented to triage with complaints of a single gush of fluid noted to be leaking at home.  Since presentation to labor and delivery there has been no further leaking.   OB History  Gravida Para Term Preterm AB Living  2 1 1  0 0 1  SAB TAB Ectopic Multiple Live Births  0 0 0 0 1    # Outcome Date GA Lbr Len/2nd Weight Sex Delivery Anes PTL Lv  2 Current           1 Term 05/02/14 [redacted]w[redacted]d  3402 g M CS-LTranv Spinal, EPI N LIV     Complications: Gestational diabetes mellitus in childbirth, insulin controlled, Excessive weight gain in pregnancy     Name: Andrea Roberson     Apgar1: 8  Apgar5: 9    No medications prior to admission.     OBJECTIVE  Nursing Evaluation:   BP 118/67 (BP Location: Left Arm)   Pulse 89   Ht 5\' 5"  (1.651 m)   Wt 103.4 kg   LMP 02/22/2018   BMI 37.94 kg/m    Findings:   Nurse notes peri-pad dry.  No leakage observed.  No contractions noted     NTZ negative NST was performed and has been reviewed by me.  NST INTERPRETATION: Appropriate for gestational age.  Mode: External Baseline Rate (A): 152 bpm(fht) Variability: Moderate Accelerations: None Decelerations: Variable     Contraction Frequency (min): none  ASSESSMENT Impression:  1.  Pregnancy:  G2P1001 at [redacted]w[redacted]d , EDD Estimated Date of Delivery: 11/30/18 2.  NST:  Appropriate for gestational age.  3.  Possible urine leakage as there is no evidence of SROM.  PLAN 1. Reassurance given 2. Discharge home with standard labor precautions given to return to L&D or call the office for problems. 3. Continue routine prenatal care.

## 2018-07-24 ENCOUNTER — Ambulatory Visit (INDEPENDENT_AMBULATORY_CARE_PROVIDER_SITE_OTHER): Payer: BC Managed Care – PPO | Admitting: Obstetrics and Gynecology

## 2018-07-24 VITALS — BP 111/77 | HR 94 | Temp 97.7°F | Wt 237.0 lb

## 2018-07-24 DIAGNOSIS — J060 Acute laryngopharyngitis: Secondary | ICD-10-CM

## 2018-07-24 NOTE — Progress Notes (Signed)
Pt presents today with sore throat, cold chills, aches, congestion, ears keep popping and cough for 2 weeks. Pt has not had a fever. Pt has had a confirmed case of the flu at her job. Pt is taking flonase, zyrtek and robitussin with little relief.

## 2018-07-24 NOTE — Progress Notes (Signed)
She presents today with complaint of a 3-day history of sore throat cough and generally feeling poorly.  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  She has received her flu shot.  She reports no problems with the pregnancy.  She has tried multiple over-the-counter medications without success.   Rapid strep test performed - NEGATIVE Recommend use of Robitussin-DM, vaporizer, Tylenol.

## 2018-08-08 ENCOUNTER — Ambulatory Visit: Payer: BC Managed Care – PPO | Admitting: Obstetrics and Gynecology

## 2018-08-08 ENCOUNTER — Encounter: Payer: BC Managed Care – PPO | Admitting: Obstetrics and Gynecology

## 2018-08-08 VITALS — BP 119/78 | HR 97 | Wt 242.0 lb

## 2018-08-08 DIAGNOSIS — Z3482 Encounter for supervision of other normal pregnancy, second trimester: Secondary | ICD-10-CM

## 2018-08-08 LAB — POCT URINALYSIS DIPSTICK OB
BILIRUBIN UA: NEGATIVE
GLUCOSE, UA: NEGATIVE
Ketones, UA: NEGATIVE
Leukocytes, UA: NEGATIVE
Nitrite, UA: NEGATIVE
RBC UA: NEGATIVE
SPEC GRAV UA: 1.02 (ref 1.010–1.025)
Urobilinogen, UA: 0.2 E.U./dL
pH, UA: 6.5 (ref 5.0–8.0)

## 2018-08-08 NOTE — Progress Notes (Signed)
Pt presents today for ROB. Pt has no concern at this time.

## 2018-08-08 NOTE — Progress Notes (Signed)
ROB: Occ back pain occ sciatica - pt "already counting the days"    !hr GCT next visit

## 2018-09-05 ENCOUNTER — Other Ambulatory Visit: Payer: BC Managed Care – PPO

## 2018-09-05 ENCOUNTER — Ambulatory Visit (INDEPENDENT_AMBULATORY_CARE_PROVIDER_SITE_OTHER): Payer: BC Managed Care – PPO | Admitting: Obstetrics and Gynecology

## 2018-09-05 VITALS — BP 111/72 | HR 105 | Wt 251.3 lb

## 2018-09-05 DIAGNOSIS — M7989 Other specified soft tissue disorders: Secondary | ICD-10-CM

## 2018-09-05 DIAGNOSIS — O2602 Excessive weight gain in pregnancy, second trimester: Secondary | ICD-10-CM

## 2018-09-05 DIAGNOSIS — O2600 Excessive weight gain in pregnancy, unspecified trimester: Secondary | ICD-10-CM

## 2018-09-05 DIAGNOSIS — O09292 Supervision of pregnancy with other poor reproductive or obstetric history, second trimester: Secondary | ICD-10-CM

## 2018-09-05 DIAGNOSIS — Z8632 Personal history of gestational diabetes: Secondary | ICD-10-CM

## 2018-09-05 DIAGNOSIS — Z3482 Encounter for supervision of other normal pregnancy, second trimester: Secondary | ICD-10-CM

## 2018-09-05 LAB — POCT URINALYSIS DIPSTICK OB
BILIRUBIN UA: NEGATIVE
Glucose, UA: NEGATIVE
Ketones, UA: NEGATIVE
LEUKOCYTES UA: NEGATIVE
PH UA: 7.5 (ref 5.0–8.0)
POC,PROTEIN,UA: NEGATIVE
RBC UA: NEGATIVE
Spec Grav, UA: <=1.005 — AB (ref 1.010–1.025)
UROBILINOGEN UA: 0.2 U/dL

## 2018-09-05 MED ORDER — TETANUS-DIPHTH-ACELL PERTUSSIS 5-2.5-18.5 LF-MCG/0.5 IM SUSP
0.5000 mL | Freq: Once | INTRAMUSCULAR | Status: AC
  Administered 2018-09-05: 0.5 mL via INTRAMUSCULAR

## 2018-09-05 NOTE — Progress Notes (Signed)
ROB: Patient complains of right side swelling (hand and leg).  Is using hand brace and wearing compression stockings. Also complains of cramping in her back, advised on Tylenol, warm compresses.  Discussed excessive weight gain in pregnancy (TWG 49 lbs thus far). Patient has seen a nutritionist in the past. Notes difficulty trying to exercise due to prior ankle injury. Discussed low-impact exercises, caloric restrictions.  For 28 week labs today.  Desires to breastfeed, considering Depo Provera for contraception. For Tdap today, signed blood consent.

## 2018-09-05 NOTE — Progress Notes (Signed)
Andrea Roberson stated that her right side is swollen and is has cramps. Pt stated that she also has back pain.

## 2018-09-05 NOTE — Patient Instructions (Signed)
Eating Plan for Pregnant Women While you are pregnant, your body will require additional nutrition to help support your growing baby. It is recommended that you consume:  150 additional calories each day during your first trimester.  300 additional calories each day during your second trimester.  300 additional calories each day during your third trimester.  Eating a healthy, well-balanced diet is very important for your health and for your baby's health. You also have a higher need for some vitamins and minerals, such as folic acid, calcium, iron, and vitamin D. What do I need to know about eating during pregnancy?  Do not try to lose weight or go on a diet during pregnancy.  Choose healthy, nutritious foods. Choose  of a sandwich with a glass of milk instead of a candy bar or a high-calorie sugar-sweetened beverage.  Limit your overall intake of foods that have "empty calories." These are foods that have little nutritional value, such as sweets, desserts, candies, sugar-sweetened beverages, and fried foods.  Eat a variety of foods, especially fruits and vegetables.  Take a prenatal vitamin to help meet the additional needs during pregnancy, specifically for folic acid, iron, calcium, and vitamin D.  Remember to stay active. Ask your health care provider for exercise recommendations that are specific to you.  Practice good food safety and cleanliness, such as washing your hands before you eat and after you prepare raw meat. This helps to prevent foodborne illnesses, such as listeriosis, that can be very dangerous for your baby. Ask your health care provider for more information about listeriosis. What does 150 extra calories look like? Healthy options for an additional 150 calories each day could be any of the following:  Plain low-fat yogurt (6-8 oz) with  cup of berries.  1 apple with 2 teaspoons of peanut butter.  Cut-up vegetables with  cup of hummus.  Low-fat chocolate  milk (8 oz or 1 cup).  1 string cheese with 1 medium orange.   of a peanut butter and jelly sandwich on whole-wheat bread (1 tsp of peanut butter).  For 300 calories, you could eat two of those healthy options each day. What is a healthy amount of weight to gain? The recommended amount of weight for you to gain is based on your pre-pregnancy BMI. If your pre-pregnancy BMI was:  Less than 18 (underweight), you should gain 28-40 lb.  18-24.9 (normal), you should gain 25-35 lb.  25-29.9 (overweight), you should gain 15-25 lb.  Greater than 30 (obese), you should gain 11-20 lb.  What if I am having twins or multiples? Generally, pregnant women who will be having twins or multiples may need to increase their daily calories by 300-600 calories each day. The recommended range for total weight gain is 25-54 lb, depending on your pre-pregnancy BMI. Talk with your health care provider for specific guidance about additional nutritional needs, weight gain, and exercise during your pregnancy. What foods can I eat? Grains Any grains. Try to choose whole grains, such as whole-wheat bread, oatmeal, or brown rice. Vegetables Any vegetables. Try to eat a variety of colors and types of vegetables to get a full range of vitamins and minerals. Remember to wash your vegetables well before eating. Fruits Any fruits. Try to eat a variety of colors and types of fruit to get a full range of vitamins and minerals. Remember to wash your fruits well before eating. Meats and Other Protein Sources Lean meats, including chicken, turkey, fish, and lean cuts of beef, veal,   or pork. Make sure that all meats are cooked to "well done." Tofu. Tempeh. Beans. Eggs. Peanut butter and other nut butters. Seafood, such as shrimp, crab, and lobster. If you choose fish, select types that are higher in omega-3 fatty acids, including salmon, herring, mussels, trout, sardines, and pollock. Make sure that all meats are cooked to  food-safe temperatures. Dairy Pasteurized milk and milk alternatives. Pasteurized yogurt and pasteurized cheese. Cottage cheese. Sour cream. Beverages Water. Juices that contain 100% fruit juice or vegetable juice. Caffeine-free teas and decaffeinated coffee. Drinks that contain caffeine are okay to drink, but it is better to avoid caffeine. Keep your total caffeine intake to less than 200 mg each day (12 oz of coffee, tea, or soda) or as directed by your health care provider. Condiments Any pasteurized condiments. Sweets and Desserts Any sweets and desserts. Fats and Oils Any fats and oils. The items listed above may not be a complete list of recommended foods or beverages. Contact your dietitian for more options. What foods are not recommended? Vegetables Unpasteurized (raw) vegetable juices. Fruits Unpasteurized (raw) fruit juices. Meats and Other Protein Sources Cured meats that have nitrates, such as bacon, salami, and hotdogs. Luncheon meats, bologna, or other deli meats (unless they are reheated until they are steaming hot). Refrigerated pate, meat spreads from a meat counter, smoked seafood that is found in the refrigerated section of a store. Raw fish, such as sushi or sashimi. High mercury content fish, such as tilefish, shark, swordfish, and king mackerel. Raw meats, such as tuna or beef tartare. Undercooked meats and poultry. Make sure that all meats are cooked to food-safe temperatures. Dairy Unpasteurized (raw) milk and any foods that have raw milk in them. Soft cheeses, such as feta, queso blanco, queso fresco, Brie, Camembert cheeses, blue-veined cheeses, and Panela cheese (unless it is made with pasteurized milk, which must be stated on the label). Beverages Alcohol. Sugar-sweetened beverages, such as sodas, teas, or energy drinks. Condiments Homemade fermented foods and drinks, such as pickles, sauerkraut, or kombucha drinks. (Store-bought pasteurized versions of these are  okay.) Other Salads that are made in the store, such as ham salad, chicken salad, egg salad, tuna salad, and seafood salad. The items listed above may not be a complete list of foods and beverages to avoid. Contact your dietitian for more information. This information is not intended to replace advice given to you by your health care provider. Make sure you discuss any questions you have with your health care provider. Document Released: 07/18/2014 Document Revised: 03/10/2016 Document Reviewed: 03/18/2014 Elsevier Interactive Patient Education  2018 [employer]vier Inc.   

## 2018-09-06 LAB — CBC
HEMOGLOBIN: 11.3 g/dL (ref 11.1–15.9)
Hematocrit: 33.9 % — ABNORMAL LOW (ref 34.0–46.6)
MCH: 27.6 pg (ref 26.6–33.0)
MCHC: 33.3 g/dL (ref 31.5–35.7)
MCV: 83 fL (ref 79–97)
Platelets: 305 10*3/uL (ref 150–450)
RBC: 4.09 x10E6/uL (ref 3.77–5.28)
RDW: 13 % (ref 12.3–15.4)
WBC: 12.1 10*3/uL — AB (ref 3.4–10.8)

## 2018-09-06 LAB — RPR: RPR Ser Ql: NONREACTIVE

## 2018-09-06 LAB — GLUCOSE, 1 HOUR GESTATIONAL: GESTATIONAL DIABETES SCREEN: 229 mg/dL — AB (ref 65–139)

## 2018-09-07 ENCOUNTER — Other Ambulatory Visit: Payer: Self-pay

## 2018-09-07 DIAGNOSIS — Z3483 Encounter for supervision of other normal pregnancy, third trimester: Secondary | ICD-10-CM

## 2018-09-19 ENCOUNTER — Encounter: Payer: Self-pay | Admitting: Obstetrics and Gynecology

## 2018-09-19 ENCOUNTER — Ambulatory Visit (INDEPENDENT_AMBULATORY_CARE_PROVIDER_SITE_OTHER): Payer: BC Managed Care – PPO | Admitting: Obstetrics and Gynecology

## 2018-09-19 ENCOUNTER — Telehealth: Payer: Self-pay

## 2018-09-19 VITALS — BP 127/83 | HR 108 | Wt 254.0 lb

## 2018-09-19 DIAGNOSIS — Z3493 Encounter for supervision of normal pregnancy, unspecified, third trimester: Secondary | ICD-10-CM

## 2018-09-19 LAB — POCT URINALYSIS DIPSTICK OB
BILIRUBIN UA: NEGATIVE
Blood, UA: NEGATIVE
Glucose, UA: NEGATIVE
Ketones, UA: NEGATIVE
LEUKOCYTES UA: NEGATIVE
NITRITE UA: NEGATIVE
PH UA: 6 (ref 5.0–8.0)
POC,PROTEIN,UA: NEGATIVE
SPEC GRAV UA: 1.015 (ref 1.010–1.025)
Urobilinogen, UA: 0.2 E.U./dL

## 2018-09-19 NOTE — Telephone Encounter (Signed)
Spoke with Andrea Roberson at [facility]. [name] will call patient today to set up follow-up.

## 2018-09-19 NOTE — Progress Notes (Signed)
ROB: Occasional pelvic pressure.  Occasional peripheral edema right greater than left.  No cords palpated no discrepancy at this time.  Patient still [facility] not [facility] for [facility] her blood sugars.  Will retry to refer her.  Importance of glycemic control discussed in detail.

## 2018-09-19 NOTE — Progress Notes (Signed)
ROB, c/o intermittent vaginal pressure that started last night.

## 2018-09-21 ENCOUNTER — Other Ambulatory Visit: Payer: Self-pay

## 2018-09-21 ENCOUNTER — Encounter: Payer: Self-pay | Admitting: *Deleted

## 2018-09-21 ENCOUNTER — Encounter: Payer: BC Managed Care – PPO | Attending: Obstetrics and Gynecology | Admitting: *Deleted

## 2018-09-21 VITALS — BP 108/70 | Wt 253.2 lb

## 2018-09-21 DIAGNOSIS — O24415 Gestational diabetes mellitus in pregnancy, controlled by oral hypoglycemic drugs: Secondary | ICD-10-CM | POA: Diagnosis not present

## 2018-09-21 DIAGNOSIS — O24410 Gestational diabetes mellitus in pregnancy, diet controlled: Secondary | ICD-10-CM

## 2018-09-21 DIAGNOSIS — Z3A00 Weeks of gestation of pregnancy not specified: Secondary | ICD-10-CM | POA: Insufficient documentation

## 2018-09-21 MED ORDER — GLUCOSE BLOOD VI STRP: ORAL_STRIP | 12 refills | Status: DC

## 2018-09-21 MED ORDER — ONETOUCH ULTRASOFT LANCETS MISC: 12 refills | Status: DC

## 2018-09-21 NOTE — Progress Notes (Signed)
Diabetes Self-Management Education  Visit Type: First/Initial  Appt. Start Time: 1315 Appt. End Time: 1445  09/21/2018  Ms. Andrea Roberson, identified by name and date of birth, is a 32 y.o. female with a diagnosis of Diabetes: Gestational Diabetes.   ASSESSMENT  Blood pressure 108/70, weight 253 lb 3.2 oz (114.9 kg), last menstrual period 02/22/2018. Body mass index is 42.13 kg/m.  Diabetes Self-Management Education - 09/21/18 1423      Visit Information   Visit Type  First/Initial      Initial Visit   Diabetes Type  Gestational Diabetes    Are you currently following a meal plan?  No    Are you taking your medications as prescribed?  Yes    Date Diagnosed  last month      Health Coping   How would you rate your overall health?  Good      Psychosocial Assessment   Patient Belief/Attitude about Diabetes  Other (comment)   "I am not happy"   Self-care barriers  None    Self-management support  Doctor's office;Family    Patient Concerns  Nutrition/Meal planning;Monitoring;Weight Control    Special Needs  None    Preferred Learning Style  Auditory;Visual    Learning Readiness  Ready    How often do you need to have someone help you when you read instructions, pamphlets, or other written materials from your doctor or pharmacy?  1 - Never    What is the last grade level you completed in school?  BS      Pre-Education Assessment   Patient understands the diabetes disease and treatment process.  Needs Instruction    Patient understands incorporating nutritional management into lifestyle.  Needs Instruction    Patient undertands incorporating physical activity into lifestyle.  Needs Instruction    Patient understands using medications safely.  Needs Instruction    Patient understands monitoring blood glucose, interpreting and using results  Needs Instruction    Patient understands prevention, detection, and treatment of acute complications.  Needs Instruction    Patient  understands prevention, detection, and treatment of chronic complications.  Needs Instruction    Patient understands how to develop strategies to address psychosocial issues.  Needs Instruction    Patient understands how to develop strategies to promote health/change behavior.  Needs Instruction      Complications   Last HgB A1C per patient/outside source  5.6 %   05/08/18   How often do you check your blood sugar?  0 times/day (not testing)   Provided One Touch Verio Flex meter and instructed on use. BG upon return demonstration was 136 mg/dL at 1:61 pm - 4 hrs pp. Pt reports eating chicken sandwich and apples with sugar for lunch.    Have you had a dilated eye exam in the past 12 months?  Yes    Have you had a dental exam in the past 12 months?  Yes    Are you checking your feet?  No      Dietary Intake   Breakfast  grits or oatmeal    Snack (morning)  Greek yogurt or cheese with dried fruit    Lunch  corn, green beans, potato soup, lean cuizine    Dinner  [place] - occasional pork and chicken; sweet potato, occasional rice and pasta, green beans, broccoli, lettuce, tomatoes, carrots    Beverage(s)  water, 1 cup of coffee per day      Exercise   Exercise Type  ADL's  Patient Education   Previous Diabetes Education  Yes (please comment)   GDM in 2015 - was also on Insulin   Disease state   Definition of diabetes, type 1 and 2, and the diagnosis of diabetes;Factors that contribute to the development of diabetes    Nutrition management   Role of diet in the treatment of diabetes and the relationship between the three main macronutrients and blood glucose level;Reviewed blood glucose goals for pre and post meals and how to evaluate the patients' food intake on their blood glucose level.    Physical activity and exercise   Role of exercise on diabetes management, blood pressure control and cardiac health.    Monitoring  Taught/evaluated SMBG meter.;Purpose and frequency of  SMBG.;Taught/discussed recording of test results and interpretation of SMBG.;Identified appropriate SMBG and/or A1C goals.;Ketone testing, when, how.    Acute complications  Taught treatment of hypoglycemia - the 15 rule.   Pt reports she had low blood sugars with last pregnancy.   Chronic complications  Relationship between chronic complications and blood glucose control    Psychosocial adjustment  Identified and addressed patients feelings and concerns about diabetes    Preconception care  Pregnancy and GDM  Role of pre-pregnancy blood glucose control on the development of the fetus;Role of family planning for patients with diabetes;Reviewed with patient blood glucose goals with pregnancy      Individualized Goals (developed by patient)   Reducing Risk Prevent diabetes complications Lose weight     Outcomes   Expected Outcomes  Demonstrated interest in learning. Expect positive outcomes    Future DMSE  2 wks       Individualized Plan for Diabetes Self-Management Training:   Learning Objective:  Patient will have a greater understanding of diabetes self-management. Patient education plan is to attend individual and/or group sessions per assessed needs and concerns.   Plan:   Patient Instructions  Read booklet on Gestational Diabetes Follow Gestational Meal Planning Guidelines Limit desserts/sweets Include 1 serving of protein with meals and snacks Complete a 3 Day Food Record and bring to next appointment Check blood sugars 4 x day - before breakfast and 2 hrs after every meal and record  Bring blood sugar log to all appointments Call MD for prescription for meter strips and lancets Strips One Touch Verio Lancets   One Touch Delica Purchase urine ketone strips if ordered by MD and check urine ketones every am:  If + increase bedtime snack to 1 protein and 2 carbohydrate servings Walk 20-30 minutes at least 5 x week if permitted by MD  Expected Outcomes:  Demonstrated interest in  learning. Expect positive outcomes  Education material provided:  Gestational Booklet Gestational Meal Planning Guidelines Simple Meal Plan Viewed Gestational Diabetes Video Meter = One Touch Verio Flex 3 Day Food Record Goals for a Healthy Pregnancy Glucose tablets Symptoms, causes and treatments of Hypoglycemia  If problems or questions, patient to contact team via:  Andrea SettlerSheila Nivedita Mirabella, RN, CCM, CDE [phone]  Future DSME appointment: 2 wks  October 08, 2018 with the dietitian

## 2018-09-21 NOTE — Patient Instructions (Signed)
Read booklet on Gestational Diabetes Follow Gestational Meal Planning Guidelines Limit desserts/sweets Include 1 serving of protein with meals and snacks Complete a 3 Day Food Record and bring to next appointment Check blood sugars 4 x day - before breakfast and 2 hrs after every meal and record  Bring blood sugar log to all appointments Call MD for prescription for meter strips and lancets Strips One Touch Verio Lancets   One Touch Delica Purchase urine ketone strips if ordered by MD and check urine ketones every am:  If + increase bedtime snack to 1 protein and 2 carbohydrate servings Walk 20-30 minutes at least 5 x week if permitted by MD

## 2018-09-26 ENCOUNTER — Telehealth: Payer: Self-pay

## 2018-09-26 NOTE — Telephone Encounter (Signed)
Pt called and stated that she went to the pharmacy to pick up her test strip supplies and it was the wrong brand and she is unable to use the supplies and the pharmacy refuses to allow the pt to return the items. Pt was informed that I would call and the pharmacy and speak with them.

## 2018-09-27 ENCOUNTER — Telehealth: Payer: Self-pay

## 2018-09-27 ENCOUNTER — Telehealth: Payer: Self-pay | Admitting: *Deleted

## 2018-09-27 NOTE — Telephone Encounter (Signed)
Left Message - called to speak to someone concerning the pt recieving the wrong test strips from the pharmacy and now the pharmacy will not take the strips back nor will her insurance pay for additional strips.

## 2018-09-27 NOTE — Telephone Encounter (Signed)
[facility] was having difficulty obtaining supplies for her glucometer. Called patient and she reports that she provided MD office with discharge sheet that I gave her for One Touch Verio Strips and One Touch Delica lancets. MD office called in prescription to [facility]. Pt's husband went to pick up prescription and was given One Touch Ultra strips that don't fit the Verio meter. She called [facility] and was transferred to several people and was told they would not accept them back even though she has not opened the box. We don't have One Touch Ultra meters because these meters are not made anymore. Expl[facility] to purchase a One Touch Ultra meter or have them exchange unopened bottles for One Touch Verio strips. She reports that she purchased One Touch Verio strips already due to pharmacy mix up. She will call back if she continues to have problems.

## 2018-10-03 ENCOUNTER — Ambulatory Visit (INDEPENDENT_AMBULATORY_CARE_PROVIDER_SITE_OTHER): Payer: BC Managed Care – PPO | Admitting: Obstetrics and Gynecology

## 2018-10-03 VITALS — BP 117/73 | HR 106 | Wt 253.4 lb

## 2018-10-03 DIAGNOSIS — Z3483 Encounter for supervision of other normal pregnancy, third trimester: Secondary | ICD-10-CM

## 2018-10-03 DIAGNOSIS — O24415 Gestational diabetes mellitus in pregnancy, controlled by oral hypoglycemic drugs: Secondary | ICD-10-CM

## 2018-10-03 LAB — POCT URINALYSIS DIPSTICK OB
Bilirubin, UA: NEGATIVE
GLUCOSE, UA: NEGATIVE
Ketones, UA: NEGATIVE
Leukocytes, UA: NEGATIVE
Nitrite, UA: NEGATIVE
PH UA: 6 (ref 5.0–8.0)
RBC UA: NEGATIVE
UROBILINOGEN UA: 0.2 U/dL

## 2018-10-03 MED ORDER — GLYBURIDE 2.5 MG PO TABS: 2.5000 mg | ORAL_TABLET | Freq: Every day | ORAL | 3 refills | Status: DC

## 2018-10-03 NOTE — Progress Notes (Signed)
ROB-Pt stated that she is doing well other having vaginal and lower abd pressure and pain. No other complaints.

## 2018-10-03 NOTE — Progress Notes (Signed)
ROB: Doing well, no complaints. [facility] been seen [facility] for newly diagnosed [facility]DM. Blood sugar log reviewed over course of 2 weeks, >75% of fastings are elevated, range 90-162.  2-3 post prandial lunches and 1-2 postprandial dinner values elevated.  Patient required insulin use during last pregnancy. Discussed option of beginning insulin or starting Glyburide. Patient opts for Glyburide.  Will start at 2.5 mg in the evening. Continue to encourage diet compliance. Will need to begin antenatal testing now that she is intiating medications, and for growth scan at 36 weeks. RTC in 2 weeks, and to send glucose log through Mychart next week to f/u blood sugars. Discussed timing of delivery between 38-39 weeks depending on glycemic control. Last pregnancy was induced at 38 weeks due to uncontrolled sugars on insulin.    Andrea Roberson, Andrea Lunt, MD [facility]

## 2018-10-06 ENCOUNTER — Encounter: Payer: Self-pay | Admitting: Obstetrics and Gynecology

## 2018-10-08 ENCOUNTER — Encounter: Payer: BC Managed Care – PPO | Admitting: Dietician

## 2018-10-08 ENCOUNTER — Encounter: Payer: Self-pay | Admitting: Dietician

## 2018-10-08 VITALS — BP 120/70 | Ht 65.0 in | Wt 251.8 lb

## 2018-10-08 DIAGNOSIS — O24415 Gestational diabetes mellitus in pregnancy, controlled by oral hypoglycemic drugs: Secondary | ICD-10-CM | POA: Diagnosis not present

## 2018-10-08 DIAGNOSIS — O24410 Gestational diabetes mellitus in pregnancy, diet controlled: Secondary | ICD-10-CM

## 2018-10-08 NOTE — Patient Instructions (Signed)
   Continue making healthy food choices, good job!  Increase portions of protein foods and/or low-carb veggies if needed to feel satisfied after meals.  Include 1-2 carb servings for snack(s) to help with satiety.

## 2018-10-08 NOTE — Progress Notes (Signed)
   Patient's BG record indicates BGs are generally above goal range, with fasting BGs ranging 112-166, and post-meal BGs ranging 107-226. She has started taking 2.5mg  Glyburide for the past 5 days.   Patient's food diary indicates balanced meals, no excessive carbohydrate intake -- some meals lower than goal for carb.    Provided 1600kcal meal plan, and wrote individualized menus based on patient's food preferences.  Instructed patient on food safety, including avoidance of Listeriosis, and limiting mercury from fish.  Discussed importance of maintaining healthy lifestyle habits to reduce risk of Type 2 DM as well as Gestational DM with any future pregnancies.  Advised patient to use any remaining testing supplies to test some BGs after delivery, and to have BG tested ideally annually, as well as prior to attempting future pregnancies.

## 2018-10-12 NOTE — Telephone Encounter (Signed)
1 

## 2018-10-15 ENCOUNTER — Other Ambulatory Visit: Payer: BC Managed Care – PPO

## 2018-10-15 ENCOUNTER — Ambulatory Visit (INDEPENDENT_AMBULATORY_CARE_PROVIDER_SITE_OTHER): Payer: BC Managed Care – PPO | Admitting: Obstetrics and Gynecology

## 2018-10-15 VITALS — BP 127/60 | HR 102 | Wt 253.1 lb

## 2018-10-15 DIAGNOSIS — Z3483 Encounter for supervision of other normal pregnancy, third trimester: Secondary | ICD-10-CM

## 2018-10-15 DIAGNOSIS — Z3A32 32 weeks gestation of pregnancy: Secondary | ICD-10-CM | POA: Diagnosis not present

## 2018-10-15 DIAGNOSIS — O24415 Gestational diabetes mellitus in pregnancy, controlled by oral hypoglycemic drugs: Secondary | ICD-10-CM | POA: Diagnosis not present

## 2018-10-15 MED ORDER — GLYBURIDE 5 MG PO TABS: 5.0000 mg | ORAL_TABLET | Freq: Two times a day (BID) | ORAL | 2 refills | Status: DC

## 2018-10-15 NOTE — Progress Notes (Signed)
NST today.  Patient's sugars including fasting elevated.  Will increase glyburide to 5 twice daily.  Patient will follow-up on Thursday for prenatal visit and repeat NST.

## 2018-10-17 NOTE — L&D Delivery Note (Signed)
Delivery Summary for [employer]  Labor Events:   Preterm labor:   Rupture date:   Rupture time:   Rupture type:   Fluid Color:   Induction:   Augmentation:   Complications:   Cervical ripening:          Delivery:   Episiotomy:   Lacerations:   Repair suture:   Repair # of packets:   Blood loss (ml): 525   Information for the patient's newborn:  Blaze, Domitrovich [[id number]]    Delivery 11/19/2018 8:22 AM by  C-Section, Low Transverse Sex:  female Gestational Age: [redacted]w[redacted]d Delivery Clinician:   Living?:         APGARS  One minute Five minutes Ten minutes  Skin color:        Heart rate:        Grimace:        Muscle tone:        Breathing:        Totals: 8  8      Presentation/position:      Resuscitation:   Cord information:    Disposition of cord blood:     Blood gases sent?  Complications:   Placenta: Delivered:       appearance Newborn Measurements: Weight: 7 lb 6.9 oz (3370 g)  Height: 19.29"  Head circumference:    Chest circumference:    Other providers:    Additional  information: Forceps:   Vacuum:   Breech:   Observed anomalies        See Dr. Oretha Milch operative note for details of C-section procedure.    Hildred Laser, MD [facility]

## 2018-10-18 ENCOUNTER — Encounter: Payer: Self-pay | Admitting: Obstetrics and Gynecology

## 2018-10-18 ENCOUNTER — Other Ambulatory Visit: Payer: BC Managed Care – PPO

## 2018-10-18 ENCOUNTER — Ambulatory Visit (INDEPENDENT_AMBULATORY_CARE_PROVIDER_SITE_OTHER): Payer: BC Managed Care – PPO | Admitting: Obstetrics and Gynecology

## 2018-10-18 VITALS — BP 117/69 | HR 95 | Wt 255.7 lb

## 2018-10-18 DIAGNOSIS — Z3A33 33 weeks gestation of pregnancy: Secondary | ICD-10-CM

## 2018-10-18 DIAGNOSIS — O24415 Gestational diabetes mellitus in pregnancy, controlled by oral hypoglycemic drugs: Secondary | ICD-10-CM | POA: Diagnosis not present

## 2018-10-18 DIAGNOSIS — Z3483 Encounter for supervision of other normal pregnancy, third trimester: Secondary | ICD-10-CM

## 2018-10-18 LAB — POCT URINALYSIS DIPSTICK OB
Bilirubin, UA: NEGATIVE
Blood, UA: NEGATIVE
Ketones, UA: NEGATIVE
Leukocytes, UA: NEGATIVE
Nitrite, UA: NEGATIVE
POC,PROTEIN,UA: NEGATIVE
SPEC GRAV UA: 1.01 (ref 1.010–1.025)
Urobilinogen, UA: 0.2 E.U./dL
pH, UA: 7 (ref 5.0–8.0)

## 2018-10-18 MED ORDER — INSULIN STARTER KIT- PEN NEEDLES (ENGLISH): 1.0000 | Freq: Once | Status: DC

## 2018-10-18 MED ORDER — INSULIN STARTER KIT- SYRINGES (ENGLISH): 1.0000 | Freq: Once | Status: DC

## 2018-10-18 MED ORDER — INSULIN NPH (HUMAN) (ISOPHANE) 100 UNIT/ML ~~LOC~~ SUSP: SUBCUTANEOUS | 3 refills | Status: DC

## 2018-10-18 MED ORDER — INSULIN ASPART 100 UNIT/ML ~~LOC~~ SOLN: SUBCUTANEOUS | 12 refills | Status: DC

## 2018-10-18 NOTE — Addendum Note (Signed)
Addended by: Dorian Pod on: 10/18/2018 02:19 PM   Modules accepted: Orders

## 2018-10-18 NOTE — Progress Notes (Signed)
ROB: She has tried increasing her dose of glyburide as directed and she reports that her sugars are still not good.  Her fasting this morning was 212.  I will switch her to insulin for better control. NST done today-reactive.   Plan weekly NSTs and visits for glycemic control.  Referral to [facility] for use of insulin.

## 2018-10-18 NOTE — Addendum Note (Signed)
Addended by: Elonda HuskyEVANS, Olinda Nola J on: 10/18/2018 09:56 AM   Modules accepted: Orders

## 2018-10-19 ENCOUNTER — Telehealth: Payer: Self-pay | Admitting: Obstetrics and Gynecology

## 2018-10-19 ENCOUNTER — Encounter: Payer: Self-pay | Admitting: *Deleted

## 2018-10-19 ENCOUNTER — Encounter: Payer: BC Managed Care – PPO | Attending: Obstetrics and Gynecology | Admitting: *Deleted

## 2018-10-19 VITALS — BP 106/70 | Ht 65.0 in | Wt 257.2 lb

## 2018-10-19 DIAGNOSIS — O24415 Gestational diabetes mellitus in pregnancy, controlled by oral hypoglycemic drugs: Secondary | ICD-10-CM | POA: Diagnosis not present

## 2018-10-19 DIAGNOSIS — Z3A00 Weeks of gestation of pregnancy not specified: Secondary | ICD-10-CM | POA: Diagnosis not present

## 2018-10-19 DIAGNOSIS — O24414 Gestational diabetes mellitus in pregnancy, insulin controlled: Secondary | ICD-10-CM

## 2018-10-19 NOTE — Patient Instructions (Addendum)
  Follow Gestational Meal Planning Guidelines Check blood sugars 4 x day - before breakfast and 2 hrs after every meal and record  Bring blood sugar log to MD appointments  Walk 20-30 minutes at least 5 x week if permitted by MD  Carry fast acting glucose and a snack at all times Rotate injection sites Eat within 15 minutes of taking NovoLog and NPH Roll cloudy insulin (NPH) - don't shake Syringes of 100 units are in 2 unit increments Syringes of 50 units are in 1 unit increments  Morning insulin (before breakfast) NovoLog        (clear)      23 units NPH                (cloudy)       47 units                                                        Evening insulin (before supper) NovoLog  (clear)                 17 units NPH             (cloudy)      17 units

## 2018-10-19 NOTE — Telephone Encounter (Signed)
The patient called and stated that she would like to receive a call back from her nurse in regards to her having some issues with her insurance covering her insulin and also needing prior authorization. The patient did not disclose any other information. Please advise.

## 2018-10-19 NOTE — Progress Notes (Signed)
Diabetes Self-Management Education  Visit Type:  Follow-up(Insulin instruction)  Appt. Start Time: 0900   End Time: 1000  10/19/2018  Andrea Roberson, identified by name and date of birth, is a 33 y.o. female with a diagnosis of Diabetes: Gestational Diabetes.   ASSESSMENT  Blood pressure 106/70, height 5' 5" (1.651 m), weight 257 lb 3.2 oz (116.7 kg), last menstrual period 02/22/2018. Body mass index is 42.8 kg/m.   Diabetes Self-Management Education - 10/19/18 1000      Initial Visit   What type of meal plan do you follow?  Gestational       Complications   How often do you check your blood sugar?  3-4 times/day   No records   Fasting Blood glucose range (mg/dL)  130-179;180-200;>200   Pt reports FBG's 140-190 mg/dL   Postprandial Blood glucose range (mg/dL)  130-179;180-200   Pt reports pp's 140-190     Dietary Intake   Breakfast  3 meals and 2 snacks/day      Exercise   Exercise Type  ADL's      Patient Education   Previous Diabetes Education  Yes (please comment)   last month for GDM class   Nutrition management   Meal timing in regards to the patients' current diabetes medication.    Physical activity and exercise   Role of exercise on diabetes management, blood pressure control and cardiac health.    Medications  Taught/reviewed insulin injection, site rotation, insulin storage and needle disposal.;Reviewed patients medication for diabetes, action, purpose, timing of dose and side effects.   Instructed on use of syringe and pen. Pt injected 5 units NS to left abdomen without difficulty. She was on insulin for her last pregnancy.    Monitoring  Taught/discussed recording of test results and interpretation of SMBG.;Identified appropriate SMBG and/or A1C goals.    Acute complications  Taught treatment of hypoglycemia - the 15 rule.    Preconception care  Reviewed with patient blood glucose goals with pregnancy      Outcomes   Program Status  Completed        Learning Objective:  Patient will have a greater understanding of diabetes self-management. Patient education plan is to attend individual and/or group sessions per assessed needs and concerns.   Plan:   Patient Instructions     Follow Gestational Meal Planning Guidelines Check blood sugars 4 x day - before breakfast and 2 hrs after every meal and record  Bring blood sugar log to MD appointments  Walk 20-30 minutes at least 5 x week if permitted by MD  Carry fast acting glucose and a snack at all times Rotate injection sites Eat within 15 minutes of taking NovoLog and NPH Roll cloudy insulin (NPH) - don't shake Syringes of 100 units are in 2 unit increments Syringes of 50 units are in 1 unit increments  Morning insulin (before breakfast) NovoLog        (clear)      23 units NPH                (cloudy)       47 units                                                        Evening insulin (before supper) NovoLog  (clear)  17 units NPH             (cloudy)      17 units                              Expected Outcomes:  Demonstrated interest in learning. Expect positive outcomes  Education material provided:  BD Getting Started Take Home Kit 4 packs of 50 unit syringes (40) 1 pack of 100 unit syringes (10) Glucose tablets Symptoms, causes and treatments of Hypoglycemia  If problems or questions, patient to contact team via:  Johny Drilling, [place], [place], CDE [phone]  Future DSME appointment: - prn

## 2018-10-19 NOTE — Telephone Encounter (Signed)
Spoke with patients insurance and they stated that patient has to try Novolin first. Called pharmacy and they stated that the prescription was wrote for Humulin or Novolin. They will fill the Novolin for the patient. I left message on patients cell phone with this information.

## 2018-10-22 ENCOUNTER — Telehealth: Payer: Self-pay | Admitting: Obstetrics and Gynecology

## 2018-10-22 ENCOUNTER — Telehealth: Payer: Self-pay | Admitting: *Deleted

## 2018-10-22 NOTE — Telephone Encounter (Signed)
Phone call to patient to follow up with insulin administration. She reports that injections are going okay but her numbers are still high. She increased her dosages on Saturday to NovoLog 23 in the am and 17 in the pm with Novolin N 47 units in the am and 17 units in the pm. Her fasting was 181 mg/dL today and her reading after breafast was 165 mg/dL. She denies any symptoms of hypoglycemia. The patient has called and left a message with OB office for MD to adjust insulin. Instructed her to call for any questions.

## 2018-10-22 NOTE — Telephone Encounter (Signed)
pls advise- see where pt contacted [facility]

## 2018-10-22 NOTE — Telephone Encounter (Signed)
The patient states the insulin is not working.  She also states no matter "what I eat, it is not coming down!"  Sugars are ranging anywhere from 145 to 200 fasting.  After meals, it's anywhere from 140 to sometimes 220 two hours postprandial.  She states she can even eat salad and grilled chicken.  The dietician has her keeping track of what she is eating and reviewed her meals with her, and they state she is doing right so they informed her to work with her provider on a correct insulin dosage.  Today she had [place] sausage, an egg and wheat toast and it was still 165 two hours postprandial.  She is asking for a nurse to call her back, please advise, thanks.

## 2018-10-23 ENCOUNTER — Other Ambulatory Visit: Payer: Self-pay

## 2018-10-23 ENCOUNTER — Encounter: Payer: Self-pay | Admitting: Obstetrics and Gynecology

## 2018-10-23 NOTE — Telephone Encounter (Signed)
After speaking with AC pt was called and informed to increase her insulin to 3 units. Pt was informed that she just started taking the insulin and it will take awhile before her body will adjust. Pt was informed to continue documenting her sugar levels and what she eats. Pt was advised if she has any other issues to please contact the office. Pt stated that she understood.

## 2018-10-26 ENCOUNTER — Other Ambulatory Visit: Payer: Self-pay

## 2018-10-26 ENCOUNTER — Encounter: Payer: Self-pay | Admitting: Obstetrics and Gynecology

## 2018-10-26 ENCOUNTER — Ambulatory Visit (INDEPENDENT_AMBULATORY_CARE_PROVIDER_SITE_OTHER): Payer: BC Managed Care – PPO | Admitting: Obstetrics and Gynecology

## 2018-10-26 ENCOUNTER — Other Ambulatory Visit: Payer: BC Managed Care – PPO

## 2018-10-26 VITALS — BP 120/74 | HR 97 | Wt 260.1 lb

## 2018-10-26 DIAGNOSIS — Z3A34 34 weeks gestation of pregnancy: Secondary | ICD-10-CM

## 2018-10-26 DIAGNOSIS — O24414 Gestational diabetes mellitus in pregnancy, insulin controlled: Secondary | ICD-10-CM | POA: Diagnosis not present

## 2018-10-26 DIAGNOSIS — O34219 Maternal care for unspecified type scar from previous cesarean delivery: Secondary | ICD-10-CM

## 2018-10-26 DIAGNOSIS — O2603 Excessive weight gain in pregnancy, third trimester: Secondary | ICD-10-CM

## 2018-10-26 DIAGNOSIS — O0993 Supervision of high risk pregnancy, unspecified, third trimester: Secondary | ICD-10-CM

## 2018-10-26 LAB — POCT URINALYSIS DIPSTICK OB
Bilirubin, UA: NEGATIVE
Blood, UA: NEGATIVE
Glucose, UA: NEGATIVE
Ketones, UA: NEGATIVE
Leukocytes, UA: NEGATIVE
NITRITE UA: NEGATIVE
PROTEIN: NEGATIVE
Spec Grav, UA: 1.015 (ref 1.010–1.025)
UROBILINOGEN UA: 0.2 U/dL
pH, UA: 7 (ref 5.0–8.0)

## 2018-10-26 MED ORDER — HYDROXYZINE PAMOATE 25 MG PO CAPS: 25.0000 mg | ORAL_CAPSULE | Freq: Four times a day (QID) | ORAL | 1 refills | Status: DC | PRN

## 2018-10-26 NOTE — Progress Notes (Signed)
ROB: Patient complains of swollen feet, hands, and face. BPs reviewed, normal today on NST, no protein in urine. Using compression stockings and elevating feet. Blood sugar log reviewed, notes that she continues to see the Nutritionist who notes that she is working well on dietary adherence but still sugars are extremely variable. Fastings improved some since increasing insulin dose.  Increased all dosing by 3 units on Tuesday.  Current dosing is 50 units of NPH, 26  of regular in a.m.and then 20 units of both in evening. Advised to increase by 3 more units for each insulin dosing. New regimen in 29R/53NPH in a.m., and 23/R/23NPH in evenings. Discussed that if blood sugars continue to remain uncontrolled, will need delivery at ~ 38 weeks instead of 39. Due for growth scan next visit, will add BPP so patient will not require NST next week. To resume NSTs the following week. 36 week labs done today. RTC in 1 week.     NONSTRESS TEST INTERPRETATION  INDICATIONS: Gestational DM (insulin) and Obesity  FHR baseline: 150 bpm RESULTS:Reactive (with vibroacoustic stimulator) COMMENTS: no contractions. BPs 120s/70s.    PLAN: 1. Continue fetal kick counts twice a day. 2. Continue antepartum testing as scheduled-Biweekly

## 2018-10-26 NOTE — Progress Notes (Signed)
ROB-pt is present today for ob visit and nst. Pt stated that she has elevated blood glucose levels, swollen feet, hands and face. Pt stated that the she is having a lot of pain and pressure in the lower abd/vaginal area.

## 2018-10-27 DIAGNOSIS — O2603 Excessive weight gain in pregnancy, third trimester: Secondary | ICD-10-CM | POA: Insufficient documentation

## 2018-10-27 DIAGNOSIS — O24414 Gestational diabetes mellitus in pregnancy, insulin controlled: Secondary | ICD-10-CM | POA: Insufficient documentation

## 2018-10-31 ENCOUNTER — Ambulatory Visit (INDEPENDENT_AMBULATORY_CARE_PROVIDER_SITE_OTHER): Payer: BC Managed Care – PPO

## 2018-10-31 ENCOUNTER — Encounter: Payer: Self-pay | Admitting: Obstetrics and Gynecology

## 2018-10-31 ENCOUNTER — Ambulatory Visit (INDEPENDENT_AMBULATORY_CARE_PROVIDER_SITE_OTHER): Payer: BC Managed Care – PPO | Admitting: Obstetrics and Gynecology

## 2018-10-31 VITALS — BP 135/77 | HR 98 | Wt 261.3 lb

## 2018-10-31 DIAGNOSIS — O24414 Gestational diabetes mellitus in pregnancy, insulin controlled: Secondary | ICD-10-CM | POA: Diagnosis not present

## 2018-10-31 DIAGNOSIS — O0993 Supervision of high risk pregnancy, unspecified, third trimester: Secondary | ICD-10-CM | POA: Diagnosis not present

## 2018-10-31 DIAGNOSIS — Z3A35 35 weeks gestation of pregnancy: Secondary | ICD-10-CM

## 2018-10-31 LAB — POCT URINALYSIS DIPSTICK OB
Bilirubin, UA: NEGATIVE
Blood, UA: NEGATIVE
Glucose, UA: NEGATIVE
Ketones, UA: NEGATIVE
Leukocytes, UA: NEGATIVE
Nitrite, UA: NEGATIVE
Spec Grav, UA: 1.01 (ref 1.010–1.025)
Urobilinogen, UA: 0.2 E.U./dL
pH, UA: 6.5 (ref 5.0–8.0)

## 2018-10-31 LAB — GC/CHLAMYDIA PROBE AMP
Chlamydia trachomatis, NAA: NEGATIVE
Neisseria gonorrhoeae by PCR: NEGATIVE

## 2018-10-31 LAB — STREP GP B NAA: STREP GROUP B AG: NEGATIVE

## 2018-10-31 NOTE — Progress Notes (Signed)
Patient comes in today for OB visit. Patient had BPP scan today. Patient had GC and GBS at last visit.

## 2018-10-31 NOTE — Progress Notes (Signed)
ROB:  Patient had biophysical profile today and growth ultrasound.  8 out of 8 growth 50th percentile.  She presents with her sugar log and she continues to have very high fastings and her evening sugars are also high.  Have changed the dose to 56/30 28/28.  Have changed and attempt to improve her fastings and 2-hour postprandials after evening meals. Cesarean delivery at 38 weeks discussed.

## 2018-11-06 ENCOUNTER — Telehealth: Payer: Self-pay | Admitting: Obstetrics and Gynecology

## 2018-11-06 NOTE — Telephone Encounter (Signed)
Pt was called to see how she was doing. Pt stated that she slipped on a rug at work but did not fall. Pt stated that she think she may have pulled a area in the groin area. Pt was advised to take it easy for the rest of the evening and take tylenol 500 mg and see if the pain eases. Pt was also advised if the pain did not ease after taking the tylenol after 4 hours that she could go to the L&D to be seen.

## 2018-11-06 NOTE — Telephone Encounter (Signed)
The patient slid on a rug at work and is having cramps in her lower stomach and her back.  This incident happened today about an hour and a half ago.  She is asking if she needs to come in and be seen?  Did not hit her stomach at all.  One foot was planted, and the other foot was on the rug that slid and now she has pain in her pelvic area.  No bleeding, no leaking of fluid.  She states the only thing is she is getting more sore.  Please advise, thanks.

## 2018-11-09 ENCOUNTER — Ambulatory Visit (INDEPENDENT_AMBULATORY_CARE_PROVIDER_SITE_OTHER): Payer: BC Managed Care – PPO | Admitting: Obstetrics and Gynecology

## 2018-11-09 ENCOUNTER — Other Ambulatory Visit: Payer: BC Managed Care – PPO

## 2018-11-09 VITALS — BP 130/88 | HR 99 | Wt 263.0 lb

## 2018-11-09 DIAGNOSIS — Z3A36 36 weeks gestation of pregnancy: Secondary | ICD-10-CM | POA: Diagnosis not present

## 2018-11-09 DIAGNOSIS — O24414 Gestational diabetes mellitus in pregnancy, insulin controlled: Secondary | ICD-10-CM

## 2018-11-09 DIAGNOSIS — O0993 Supervision of high risk pregnancy, unspecified, third trimester: Secondary | ICD-10-CM

## 2018-11-09 DIAGNOSIS — O99213 Obesity complicating pregnancy, third trimester: Secondary | ICD-10-CM

## 2018-11-09 DIAGNOSIS — O34219 Maternal care for unspecified type scar from previous cesarean delivery: Secondary | ICD-10-CM

## 2018-11-09 DIAGNOSIS — F3181 Bipolar II disorder: Secondary | ICD-10-CM

## 2018-11-09 DIAGNOSIS — O99210 Obesity complicating pregnancy, unspecified trimester: Secondary | ICD-10-CM

## 2018-11-09 LAB — POCT URINALYSIS DIPSTICK OB
Bilirubin, UA: NEGATIVE
Blood, UA: NEGATIVE
Glucose, UA: NEGATIVE
Ketones, UA: NEGATIVE
LEUKOCYTES UA: NEGATIVE
Nitrite, UA: NEGATIVE
POC,PROTEIN,UA: NEGATIVE
Urobilinogen, UA: 0.2 E.U./dL
pH, UA: 6.5 (ref 5.0–8.0)

## 2018-11-09 MED ORDER — FLUOXETINE HCL 20 MG PO CAPS: 20.0000 mg | ORAL_CAPSULE | Freq: Every day | ORAL | 6 refills | Status: DC

## 2018-11-09 NOTE — Progress Notes (Signed)
ROB-Pt stated that she has been having some pressure in the vaginal area. Pt stated that she noticed some brown blood/dischange twice. EPDS=18.

## 2018-11-09 NOTE — Progress Notes (Signed)
ROB: Patient notes blood sugars have been a little off despite change in insulin dose last week as her father is in the ICU at [facility] and things aren't looking good for him, and she has been commuting back and forth and eating out more.  Will not change insulin regimen at this time. She notes that she is feeling depressed due to the circumstances.  Has been on Prozac in the past for her bipolar, desires to start back on medication. Will prescribe Prozac 20 mg.  Patient also notes that she now desires BTL with her C-section. Patient aware of all other forms of reversible contraception. Will schedule. Also noting some vaginal pressure but no contractions.  NST performed today was reviewed and was found to be reactive.  Continue recommended antenatal testing and prenatal care.   NONSTRESS TEST INTERPRETATION  INDICATIONS: Gestational Diabetes on insulin and Obesity  FHR baseline: 140 RESULTS:Reactive COMMENTS: no contractions   PLAN: 1. Continue fetal kick counts twice a day. 2. Continue antepartum testing as scheduled-weekly

## 2018-11-12 ENCOUNTER — Telehealth: Payer: Self-pay | Admitting: Obstetrics and Gynecology

## 2018-11-12 NOTE — Telephone Encounter (Signed)
Pt called back and made an appointment to see DJE this week.

## 2018-11-12 NOTE — Telephone Encounter (Signed)
Called patient at preferred contact number, did not get an answer and left a voicemail for patient to contact the office at her earliest convenience to schedule her next ob appointment with Dr. Logan Bores due to her having to leave suddenly due to a family emergency. I will reach back out to the patient again. Thank you.

## 2018-11-13 ENCOUNTER — Telehealth: Payer: Self-pay | Admitting: Obstetrics and Gynecology

## 2018-11-13 ENCOUNTER — Ambulatory Visit (INDEPENDENT_AMBULATORY_CARE_PROVIDER_SITE_OTHER): Payer: BC Managed Care – PPO | Admitting: Obstetrics and Gynecology

## 2018-11-13 ENCOUNTER — Encounter: Payer: Self-pay | Admitting: Obstetrics and Gynecology

## 2018-11-13 VITALS — BP 132/84 | HR 94 | Ht 65.0 in | Wt 260.4 lb

## 2018-11-13 DIAGNOSIS — O24415 Gestational diabetes mellitus in pregnancy, controlled by oral hypoglycemic drugs: Secondary | ICD-10-CM

## 2018-11-13 DIAGNOSIS — O0993 Supervision of high risk pregnancy, unspecified, third trimester: Secondary | ICD-10-CM | POA: Diagnosis not present

## 2018-11-13 LAB — POCT URINALYSIS DIPSTICK OB
Bilirubin, UA: NEGATIVE
Blood, UA: NEGATIVE
Glucose, UA: NEGATIVE
Ketones, UA: NEGATIVE
Leukocytes, UA: NEGATIVE
Nitrite, UA: NEGATIVE
POC,PROTEIN,UA: NEGATIVE
Spec Grav, UA: 1.01 (ref 1.010–1.025)
Urobilinogen, UA: 0.2 E.U./dL
pH, UA: 6.5 (ref 5.0–8.0)

## 2018-11-13 NOTE — Progress Notes (Signed)
ROB: Patient's father passed away this past weekend.  She has been taking her insulin as directed but she is not eating well.  She states her sugars are out of control with fastings greater than 140.  She did not bring her sugar log today.  Reports daily fetal movement but possibly "decreased". I have scheduled a biophysical profile for Thursday and have strongly recommended she bring her sugar log.  Kick counts have been discussed and she is to do these daily.  The possibility of moving her cesarean delivery up has been discussed with her and we will base this on her ultrasound and sugar log Thursday.

## 2018-11-13 NOTE — Progress Notes (Signed)
Patient comes in today for ROB visit. She is schedule for C section 11/19/2018 with Dr. Valentino Saxon. Patient is having a lot of pain to where it hurts to lift her legs. Having a lot of swelling. Having some contractions. Her father passed away Mar 03, 2023. She has been under a lot of stress.

## 2018-11-13 NOTE — Telephone Encounter (Signed)
Called patient to notify her of upcoming scheduled appointments ob apt approved by [facility]. @ 9:45 am with u/s @ 11:00 am at [facility], Patient confirmed and verbally voiced understanding, Thank you -TH

## 2018-11-13 NOTE — Telephone Encounter (Signed)
The patient needs to return and see Dr. Valentino Saxon on Thursday 11/15/2018 after the patients BPP u/s, due to the u/s schedule not being open on Thursday I had to schedule the u/s at [facility] at 11:00 am and Dr. Valentino Saxon does not have any openings at all. I was advised by CM to message you all to confirm a time the pt is able to come in and be seen. Please advise.

## 2018-11-14 MED ORDER — INSULIN NPH (HUMAN) (ISOPHANE) 100 UNIT/ML ~~LOC~~ SUSP: 28.0000 [IU] | SUBCUTANEOUS | 1 refills | Status: DC

## 2018-11-14 NOTE — Addendum Note (Signed)
Addended by: Elonda Husky on: 11/14/2018 09:49 AM   Modules accepted: Orders

## 2018-11-15 ENCOUNTER — Ambulatory Visit
Admission: RE | Admit: 2018-11-15 | Discharge: 2018-11-15 | Disposition: A | Discharge status: Home / Self Care | Payer: BC Managed Care – PPO | Source: Ambulatory Visit | Attending: Obstetrics and Gynecology | Admitting: Obstetrics and Gynecology

## 2018-11-15 ENCOUNTER — Encounter: Payer: Self-pay | Admitting: Obstetrics and Gynecology

## 2018-11-15 ENCOUNTER — Ambulatory Visit (INDEPENDENT_AMBULATORY_CARE_PROVIDER_SITE_OTHER): Payer: BC Managed Care – PPO | Admitting: Obstetrics and Gynecology

## 2018-11-15 VITALS — BP 131/85 | HR 92 | Ht 65.0 in | Wt 260.0 lb

## 2018-11-15 DIAGNOSIS — O0993 Supervision of high risk pregnancy, unspecified, third trimester: Secondary | ICD-10-CM | POA: Diagnosis present

## 2018-11-15 LAB — POCT URINALYSIS DIPSTICK OB
Bilirubin, UA: NEGATIVE
Blood, UA: NEGATIVE
GLUCOSE, UA: NEGATIVE
Ketones, UA: NEGATIVE
Leukocytes, UA: NEGATIVE
Nitrite, UA: NEGATIVE
POC,PROTEIN,UA: NEGATIVE
Spec Grav, UA: 1.015 (ref 1.010–1.025)
Urobilinogen, UA: 0.2 E.U./dL
pH, UA: 6 (ref 5.0–8.0)

## 2018-11-15 NOTE — Progress Notes (Signed)
ROB: Sugars much better controlled over the last 2 days.  Patient has had a more normal life and has been able to monitor diet better.  She is scheduled for biophysical profile today.  Unless that is significantly abnormal we will plan for cesarean delivery on Monday as previously scheduled.

## 2018-11-15 NOTE — Progress Notes (Signed)
Patient comes in today for ROB visit. Having a lot of pressure and back pain. [place] at hospital at 11 today.

## 2018-11-16 ENCOUNTER — Encounter
Admission: RE | Admit: 2018-11-16 | Discharge: 2018-11-16 | Disposition: A | Discharge status: Home / Self Care | Payer: BC Managed Care – PPO | Source: Ambulatory Visit | Attending: Obstetrics and Gynecology | Admitting: Obstetrics and Gynecology

## 2018-11-16 ENCOUNTER — Other Ambulatory Visit: Payer: Self-pay

## 2018-11-16 DIAGNOSIS — Z01812 Encounter for preprocedural laboratory examination: Secondary | ICD-10-CM | POA: Insufficient documentation

## 2018-11-16 HISTORY — DX: Gastro-esophageal reflux disease without esophagitis: K21.9

## 2018-11-16 LAB — TYPE AND SCREEN
ABO/RH(D): O POS
Antibody Screen: NEGATIVE
EXTEND SAMPLE REASON: UNDETERMINED

## 2018-11-16 LAB — BASIC METABOLIC PANEL
Anion gap: 10 (ref 5–15)
BUN: 12 mg/dL (ref 6–20)
CO2: 17 mmol/L — AB (ref 22–32)
CREATININE: 0.47 mg/dL (ref 0.44–1.00)
Calcium: 8.6 mg/dL — ABNORMAL LOW (ref 8.9–10.3)
Chloride: 106 mmol/L (ref 98–111)
GFR calc Af Amer: >60 mL/min (ref >60)
GFR calc non Af Amer: >60 mL/min (ref >60)
Glucose, Bld: 130 mg/dL — ABNORMAL HIGH (ref 70–99)
Potassium: 3.6 mmol/L (ref 3.5–5.1)
Sodium: 133 mmol/L — ABNORMAL LOW (ref 135–145)

## 2018-11-16 LAB — CBC
HCT: 37.1 % (ref 36.0–46.0)
Hemoglobin: 11.7 g/dL — ABNORMAL LOW (ref 12.0–15.0)
MCH: 27.5 pg (ref 26.0–34.0)
MCHC: 31.5 g/dL (ref 30.0–36.0)
MCV: 87.3 fL (ref 80.0–100.0)
Platelets: 279 10*3/uL (ref 150–400)
RBC: 4.25 MIL/uL (ref 3.87–5.11)
RDW: 14.7 % (ref 11.5–15.5)
WBC: 8.7 10*3/uL (ref 4.0–10.5)
nRBC: 0 % (ref 0.0–0.2)

## 2018-11-16 NOTE — Addendum Note (Signed)
Addended by: Elonda Husky on: 11/16/2018 08:07 AM   Modules accepted: Orders, SmartSet

## 2018-11-16 NOTE — Patient Instructions (Addendum)
Your procedure is scheduled on: Monday, November 19, 2018 Report to the Birth Place on the 3rd floor at 5:30 am. Go through the Emergency Room Entrance and they will direct you to the [employer].  REMEMBER: Instructions that are not followed completely may result in serious medical risk, up to and including death; or upon the discretion of your surgeon and anesthesiologist your surgery may need to be rescheduled.  Do not eat food after midnight the night before surgery.  No gum chewing, lozengers or hard candies.  You may however, drink water up until 3:30 am.  ENSURE PRE-SURGERY CARBOHYDRATE DRINK:  Complete drinking by 4:30 am  No Alcohol for 24 hours before or after surgery.  No Smoking including e-cigarettes for 24 hours prior to surgery.   On the morning of surgery brush your teeth with toothpaste and water, you may rinse your mouth with mouthwash if you wish. Do not swallow any toothpaste or mouthwash.  Notify your doctor if there is any change in your medical condition (cold, fever, infection).  Do not wear jewelry, make-up, hairpins, clips or nail polish.  Do not wear lotions, powders, or perfumes.   Do not shave 48 hours prior to surgery.   Contacts may not be worn into surgery.  Do not bring valuables to the hospital, including drivers license, insurance or credit cards.  [facility] is not responsible for any belongings or valuables.   Do not take any medications the morning of surgery.  Use CHG wipes as directed on instruction sheet.  Take 1/2 of usual insulin dose the night before surgery and none on the morning of surgery. Only take 14 units of NOVOLIN N the night before surgery.  NOW!  Stop ASPIRIN and Anti-inflammatories (NSAIDS) such as Advil, Aleve, Ibuprofen, Motrin, Naproxen, Naprosyn and Aspirin based products such as Excedrin, Goodys Powder, BC Powder. (May take Tylenol or Acetaminophen if needed.)  Now!  Stop ANY OVER THE COUNTER supplements until  after surgery. (May continue multivitamin.)  Please call [phone] if you have any questions about these instructions.

## 2018-11-17 ENCOUNTER — Other Ambulatory Visit: Payer: Self-pay | Admitting: Obstetrics and Gynecology

## 2018-11-18 MED ORDER — CEFAZOLIN SODIUM-DEXTROSE 2-4 GM/100ML-% IV SOLN
2.0000 g | INTRAVENOUS | Status: AC
  Administered 2018-11-19: 2 g via INTRAVENOUS
  Filled 2018-11-18 (×2): qty 100

## 2018-11-19 ENCOUNTER — Other Ambulatory Visit: Payer: Self-pay

## 2018-11-19 ENCOUNTER — Inpatient Hospital Stay
Admission: RE | Admit: 2018-11-19 | Discharge: 2018-11-21 | DRG: 785 | Disposition: A | Discharge status: Home / Self Care | Payer: BC Managed Care – PPO | Attending: Obstetrics and Gynecology | Admitting: Obstetrics and Gynecology

## 2018-11-19 ENCOUNTER — Inpatient Hospital Stay: Payer: BC Managed Care – PPO | Admitting: Anesthesiology

## 2018-11-19 ENCOUNTER — Encounter: Payer: Self-pay | Admitting: Certified Registered Nurse Anesthetist

## 2018-11-19 ENCOUNTER — Encounter
Admission: RE | Disposition: A | Discharge status: Home / Self Care | Payer: Self-pay | Source: Home / Self Care | Attending: Obstetrics and Gynecology

## 2018-11-19 DIAGNOSIS — Z302 Encounter for sterilization: Secondary | ICD-10-CM

## 2018-11-19 DIAGNOSIS — O2603 Excessive weight gain in pregnancy, third trimester: Secondary | ICD-10-CM | POA: Diagnosis not present

## 2018-11-19 DIAGNOSIS — O24424 Gestational diabetes mellitus in childbirth, insulin controlled: Secondary | ICD-10-CM

## 2018-11-19 DIAGNOSIS — Z72820 Sleep deprivation: Secondary | ICD-10-CM | POA: Diagnosis not present

## 2018-11-19 DIAGNOSIS — F411 Generalized anxiety disorder: Secondary | ICD-10-CM | POA: Diagnosis present

## 2018-11-19 DIAGNOSIS — O99344 Other mental disorders complicating childbirth: Secondary | ICD-10-CM | POA: Diagnosis present

## 2018-11-19 DIAGNOSIS — F4320 Adjustment disorder, unspecified: Secondary | ICD-10-CM | POA: Diagnosis present

## 2018-11-19 DIAGNOSIS — O99214 Obesity complicating childbirth: Secondary | ICD-10-CM | POA: Diagnosis present

## 2018-11-19 DIAGNOSIS — E66811 Obesity, class 1: Secondary | ICD-10-CM | POA: Diagnosis present

## 2018-11-19 DIAGNOSIS — Z3A38 38 weeks gestation of pregnancy: Secondary | ICD-10-CM

## 2018-11-19 DIAGNOSIS — O34211 Maternal care for low transverse scar from previous cesarean delivery: Secondary | ICD-10-CM | POA: Diagnosis present

## 2018-11-19 DIAGNOSIS — O24414 Gestational diabetes mellitus in pregnancy, insulin controlled: Secondary | ICD-10-CM | POA: Diagnosis present

## 2018-11-19 DIAGNOSIS — Z87891 Personal history of nicotine dependence: Secondary | ICD-10-CM

## 2018-11-19 DIAGNOSIS — F3181 Bipolar II disorder: Secondary | ICD-10-CM

## 2018-11-19 DIAGNOSIS — F329 Major depressive disorder, single episode, unspecified: Secondary | ICD-10-CM | POA: Diagnosis present

## 2018-11-19 DIAGNOSIS — F419 Anxiety disorder, unspecified: Secondary | ICD-10-CM | POA: Diagnosis present

## 2018-11-19 DIAGNOSIS — O34219 Maternal care for unspecified type scar from previous cesarean delivery: Secondary | ICD-10-CM | POA: Diagnosis present

## 2018-11-19 DIAGNOSIS — E669 Obesity, unspecified: Secondary | ICD-10-CM | POA: Diagnosis present

## 2018-11-19 LAB — GLUCOSE, CAPILLARY
GLUCOSE-CAPILLARY: 95 mg/dL (ref 70–99)
Glucose-Capillary: 163 mg/dL — ABNORMAL HIGH (ref 70–99)
Glucose-Capillary: 113 mg/dL — ABNORMAL HIGH (ref 70–99)

## 2018-11-19 LAB — ABO/RH: ABO/RH(D): O POS

## 2018-11-19 SURGERY — Surgical Case
Anesthesia: Spinal

## 2018-11-19 MED ORDER — COCONUT OIL OIL
1.0000 "application " | TOPICAL_OIL | Status: DC | PRN
  Filled 2018-11-19: qty 120

## 2018-11-19 MED ORDER — ONDANSETRON HCL 4 MG/2ML IJ SOLN
4.0000 mg | Freq: Three times a day (TID) | INTRAMUSCULAR | Status: DC | PRN
  Administered 2018-11-19: 4 mg via INTRAVENOUS
  Filled 2018-11-19: qty 2

## 2018-11-19 MED ORDER — SIMETHICONE 80 MG PO CHEW
80.0000 mg | CHEWABLE_TABLET | ORAL | Status: DC
  Administered 2018-11-19 – 2018-11-20 (×2): 80 mg via ORAL
  Filled 2018-11-19 (×2): qty 1

## 2018-11-19 MED ORDER — FERROUS SULFATE 325 (65 FE) MG PO TABS
325.0000 mg | ORAL_TABLET | Freq: Two times a day (BID) | ORAL | Status: DC
  Administered 2018-11-19 – 2018-11-21 (×4): 325 mg via ORAL
  Filled 2018-11-19 (×4): qty 1

## 2018-11-19 MED ORDER — MORPHINE SULFATE (PF) 0.5 MG/ML IJ SOLN
INTRAMUSCULAR | Status: DC | PRN
  Administered 2018-11-19: .2 mg via EPIDURAL

## 2018-11-19 MED ORDER — WITCH HAZEL-GLYCERIN EX PADS: 1.0000 "application " | MEDICATED_PAD | CUTANEOUS | Status: DC | PRN

## 2018-11-19 MED ORDER — PRENATAL MULTIVITAMIN CH
1.0000 | ORAL_TABLET | Freq: Every day | ORAL | Status: DC
  Administered 2018-11-19 – 2018-11-21 (×3): 1 via ORAL
  Filled 2018-11-19 (×3): qty 1

## 2018-11-19 MED ORDER — ACETAMINOPHEN 325 MG PO TABS
650.0000 mg | ORAL_TABLET | Freq: Four times a day (QID) | ORAL | Status: AC
  Administered 2018-11-19 – 2018-11-20 (×4): 650 mg via ORAL
  Filled 2018-11-19 (×4): qty 2

## 2018-11-19 MED ORDER — NALBUPHINE HCL 10 MG/ML IJ SOLN: 5.0000 mg | Freq: Once | INTRAMUSCULAR | Status: DC | PRN

## 2018-11-19 MED ORDER — NALOXONE HCL 0.4 MG/ML IJ SOLN: 0.4000 mg | INTRAMUSCULAR | Status: DC | PRN

## 2018-11-19 MED ORDER — OXYCODONE-ACETAMINOPHEN 5-325 MG PO TABS
1.0000 | ORAL_TABLET | ORAL | Status: DC | PRN
  Administered 2018-11-21: 1 via ORAL
  Filled 2018-11-19: qty 1

## 2018-11-19 MED ORDER — KETOROLAC TROMETHAMINE 30 MG/ML IJ SOLN: 30.0000 mg | Freq: Once | INTRAMUSCULAR | Status: AC

## 2018-11-19 MED ORDER — LIDOCAINE 5 % EX PTCH
MEDICATED_PATCH | CUTANEOUS | Status: AC
  Filled 2018-11-19: qty 1

## 2018-11-19 MED ORDER — METHYLERGONOVINE MALEATE 0.2 MG/ML IJ SOLN
INTRAMUSCULAR | Status: AC
  Filled 2018-11-19: qty 1

## 2018-11-19 MED ORDER — TRAMADOL HCL 50 MG PO TABS: 50.0000 mg | ORAL_TABLET | Freq: Four times a day (QID) | ORAL | Status: DC | PRN

## 2018-11-19 MED ORDER — SODIUM CHLORIDE 0.9 % IV SOLN
INTRAVENOUS | Status: DC | PRN
  Administered 2018-11-19: 25 ug/min via INTRAVENOUS

## 2018-11-19 MED ORDER — IBUPROFEN 800 MG PO TABS
800.0000 mg | ORAL_TABLET | Freq: Four times a day (QID) | ORAL | Status: DC
  Administered 2018-11-20 – 2018-11-21 (×5): 800 mg via ORAL
  Filled 2018-11-19 (×5): qty 1

## 2018-11-19 MED ORDER — FENTANYL CITRATE (PF) 100 MCG/2ML IJ SOLN
INTRAMUSCULAR | Status: AC
  Filled 2018-11-19: qty 2

## 2018-11-19 MED ORDER — MORPHINE SULFATE (PF) 0.5 MG/ML IJ SOLN
INTRAMUSCULAR | Status: AC
  Filled 2018-11-19: qty 10

## 2018-11-19 MED ORDER — LACTATED RINGERS IV SOLN
INTRAVENOUS | Status: DC
  Administered 2018-11-19: 07:00:00 via INTRAVENOUS

## 2018-11-19 MED ORDER — BUPIVACAINE IN DEXTROSE 0.75-8.25 % IT SOLN
INTRATHECAL | Status: DC | PRN
  Administered 2018-11-19: 1.6 mL via INTRATHECAL

## 2018-11-19 MED ORDER — KETOROLAC TROMETHAMINE 30 MG/ML IJ SOLN: 30.0000 mg | Freq: Four times a day (QID) | INTRAMUSCULAR | Status: DC

## 2018-11-19 MED ORDER — PROPOFOL 10 MG/ML IV BOLUS
INTRAVENOUS | Status: AC
  Filled 2018-11-19: qty 20

## 2018-11-19 MED ORDER — LIDOCAINE 5 % EX PTCH
1.0000 | MEDICATED_PATCH | CUTANEOUS | Status: DC
  Administered 2018-11-20 – 2018-11-21 (×2): 1 via TRANSDERMAL
  Filled 2018-11-19 (×2): qty 1

## 2018-11-19 MED ORDER — LACTATED RINGERS IV SOLN
Freq: Once | INTRAVENOUS | Status: AC
  Administered 2018-11-19: 05:00:00 via INTRAVENOUS

## 2018-11-19 MED ORDER — NALBUPHINE HCL 10 MG/ML IJ SOLN: 5.0000 mg | INTRAMUSCULAR | Status: DC | PRN

## 2018-11-19 MED ORDER — OXYCODONE HCL 5 MG PO TABS: 10.0000 mg | ORAL_TABLET | ORAL | Status: DC | PRN

## 2018-11-19 MED ORDER — DIPHENHYDRAMINE HCL 25 MG PO CAPS: 25.0000 mg | ORAL_CAPSULE | ORAL | Status: DC | PRN

## 2018-11-19 MED ORDER — KETOROLAC TROMETHAMINE 30 MG/ML IJ SOLN
30.0000 mg | Freq: Four times a day (QID) | INTRAMUSCULAR | Status: DC
  Administered 2018-11-19 (×2): 30 mg via INTRAVENOUS
  Filled 2018-11-19 (×2): qty 1

## 2018-11-19 MED ORDER — SOD CITRATE-CITRIC ACID 500-334 MG/5ML PO SOLN
ORAL | Status: AC
  Administered 2018-11-19: 30 mL
  Filled 2018-11-19: qty 15

## 2018-11-19 MED ORDER — HYDROMORPHONE HCL 1 MG/ML IJ SOLN: 1.0000 mg | INTRAMUSCULAR | Status: DC | PRN

## 2018-11-19 MED ORDER — DIBUCAINE 1 % RE OINT: 1.0000 "application " | TOPICAL_OINTMENT | RECTAL | Status: DC | PRN

## 2018-11-19 MED ORDER — CARBOPROST TROMETHAMINE 250 MCG/ML IM SOLN
INTRAMUSCULAR | Status: AC
  Filled 2018-11-19: qty 1

## 2018-11-19 MED ORDER — OXYTOCIN 40 UNITS IN NORMAL SALINE INFUSION - SIMPLE MED
2.5000 [IU]/h | INTRAVENOUS | Status: AC
  Administered 2018-11-19: 2.5 [IU]/h via INTRAVENOUS

## 2018-11-19 MED ORDER — LACTATED RINGERS IV SOLN: INTRAVENOUS | Status: DC

## 2018-11-19 MED ORDER — MENTHOL 3 MG MT LOZG
1.0000 | LOZENGE | OROMUCOSAL | Status: DC | PRN
  Filled 2018-11-19: qty 9

## 2018-11-19 MED ORDER — DIPHENHYDRAMINE HCL 50 MG/ML IJ SOLN: 12.5000 mg | INTRAMUSCULAR | Status: DC | PRN

## 2018-11-19 MED ORDER — SENNOSIDES-DOCUSATE SODIUM 8.6-50 MG PO TABS
2.0000 | ORAL_TABLET | ORAL | Status: DC
  Administered 2018-11-19 – 2018-11-20 (×2): 2 via ORAL
  Filled 2018-11-19 (×2): qty 2

## 2018-11-19 MED ORDER — OXYTOCIN 40 UNITS IN NORMAL SALINE INFUSION - SIMPLE MED
INTRAVENOUS | Status: AC
  Filled 2018-11-19: qty 1000

## 2018-11-19 MED ORDER — SODIUM CHLORIDE 0.9% FLUSH: 3.0000 mL | INTRAVENOUS | Status: DC | PRN

## 2018-11-19 MED ORDER — KETOROLAC TROMETHAMINE 30 MG/ML IJ SOLN
30.0000 mg | Freq: Four times a day (QID) | INTRAMUSCULAR | Status: AC
  Administered 2018-11-19 – 2018-11-20 (×2): 30 mg via INTRAVENOUS
  Filled 2018-11-19 (×2): qty 1

## 2018-11-19 MED ORDER — MEPERIDINE HCL 25 MG/ML IJ SOLN: 6.2500 mg | INTRAMUSCULAR | Status: DC | PRN

## 2018-11-19 MED ORDER — OXYCODONE HCL 5 MG PO TABS
5.0000 mg | ORAL_TABLET | ORAL | Status: DC | PRN
  Administered 2018-11-20 (×2): 5 mg via ORAL
  Filled 2018-11-19 (×2): qty 1

## 2018-11-19 MED ORDER — OXYCODONE-ACETAMINOPHEN 5-325 MG PO TABS: 2.0000 | ORAL_TABLET | ORAL | Status: DC | PRN

## 2018-11-19 MED ORDER — MAGNESIUM HYDROXIDE 400 MG/5ML PO SUSP: 30.0000 mL | ORAL | Status: DC | PRN

## 2018-11-19 MED ORDER — LIDOCAINE 5 % EX PTCH
MEDICATED_PATCH | CUTANEOUS | Status: DC | PRN
  Administered 2018-11-19: 1 via TRANSDERMAL

## 2018-11-19 MED ORDER — LIDOCAINE HCL (PF) 2 % IJ SOLN
INTRAMUSCULAR | Status: AC
  Filled 2018-11-19: qty 10

## 2018-11-19 MED ORDER — DIPHENHYDRAMINE HCL 25 MG PO CAPS: 25.0000 mg | ORAL_CAPSULE | Freq: Four times a day (QID) | ORAL | Status: DC | PRN

## 2018-11-19 MED ORDER — FENTANYL CITRATE (PF) 100 MCG/2ML IJ SOLN
INTRAMUSCULAR | Status: DC | PRN
  Administered 2018-11-19: 15 ug via INTRAVENOUS

## 2018-11-19 MED ORDER — SIMETHICONE 80 MG PO CHEW: 80.0000 mg | CHEWABLE_TABLET | ORAL | Status: DC | PRN

## 2018-11-19 MED ORDER — ZOLPIDEM TARTRATE 5 MG PO TABS: 5.0000 mg | ORAL_TABLET | Freq: Every evening | ORAL | Status: DC | PRN

## 2018-11-19 MED ORDER — FLUOXETINE HCL 10 MG PO CAPS
20.0000 mg | ORAL_CAPSULE | Freq: Every day | ORAL | Status: DC
  Administered 2018-11-19 – 2018-11-21 (×3): 20 mg via ORAL
  Filled 2018-11-19 (×3): qty 2

## 2018-11-19 MED ORDER — PHENYLEPHRINE HCL 10 MG/ML IJ SOLN
INTRAMUSCULAR | Status: DC | PRN
  Administered 2018-11-19: 200 ug via INTRAVENOUS

## 2018-11-19 MED ORDER — GABAPENTIN 300 MG PO CAPS
300.0000 mg | ORAL_CAPSULE | Freq: Two times a day (BID) | ORAL | Status: DC
  Administered 2018-11-19 – 2018-11-21 (×4): 300 mg via ORAL
  Filled 2018-11-19 (×4): qty 1

## 2018-11-19 MED ORDER — OXYTOCIN 40 UNITS IN NORMAL SALINE INFUSION - SIMPLE MED
INTRAVENOUS | Status: DC | PRN
  Administered 2018-11-19: 500 mL via INTRAVENOUS

## 2018-11-19 SURGICAL SUPPLY — 28 items
BAG COUNTER SPONGE EZ (MISCELLANEOUS) ×2 IMPLANT
CANISTER SUCT 3000ML PPV (MISCELLANEOUS) ×2 IMPLANT
CHLORAPREP W/TINT 26ML (MISCELLANEOUS) ×4 IMPLANT
COVER WAND RF STERILE (DRAPES) ×2 IMPLANT
DERMABOND ADVANCED (GAUZE/BANDAGES/DRESSINGS) ×1
DERMABOND ADVANCED .7 DNX12 (GAUZE/BANDAGES/DRESSINGS) ×1 IMPLANT
DRSG TELFA 3X8 NADH (GAUZE/BANDAGES/DRESSINGS) ×2 IMPLANT
ELECT REM PT RETURN 9FT ADLT (ELECTROSURGICAL) ×2
ELECTRODE REM PT RTRN 9FT ADLT (ELECTROSURGICAL) ×1 IMPLANT
GAUZE SPONGE 4X4 12PLY STRL (GAUZE/BANDAGES/DRESSINGS) ×2 IMPLANT
GLOVE BIO SURGEON STRL SZ 6.5 (GLOVE) ×2 IMPLANT
GLOVE INDICATOR 7.0 STRL GRN (GLOVE) ×2 IMPLANT
GOWN STRL REUS W/ TWL LRG LVL3 (GOWN DISPOSABLE) ×2 IMPLANT
GOWN STRL REUS W/TWL LRG LVL3 (GOWN DISPOSABLE) ×2
KIT TURNOVER KIT A (KITS) ×2 IMPLANT
NS IRRIG 1000ML POUR BTL (IV SOLUTION) ×2 IMPLANT
PACK C SECTION AR (MISCELLANEOUS) ×2 IMPLANT
PAD OB MATERNITY 4.3X12.25 (PERSONAL CARE ITEMS) ×2 IMPLANT
PAD PREP 24X41 OB/GYN DISP (PERSONAL CARE ITEMS) ×2 IMPLANT
RETRACTOR TRAXI PANNICULUS (MISCELLANEOUS) ×1 IMPLANT
RETRACTOR WND ALEXIS-O 25 LRG (MISCELLANEOUS) ×1 IMPLANT
RTRCTR WOUND ALEXIS O 25CM LRG (MISCELLANEOUS) ×2
SUT MNCRL AB 4-0 PS2 18 (SUTURE) ×2 IMPLANT
SUT PLAIN 2 0 XLH (SUTURE) IMPLANT
SUT VIC AB 0 CT1 36 (SUTURE) ×8 IMPLANT
SUT VIC AB 3-0 SH 27 (SUTURE) ×1
SUT VIC AB 3-0 SH 27X BRD (SUTURE) ×1 IMPLANT
TRAXI PANNICULUS RETRACTOR (MISCELLANEOUS) ×1

## 2018-11-19 NOTE — Anesthesia Post-op Follow-up Note (Signed)
Anesthesia QCDR form completed.        

## 2018-11-19 NOTE — H&P (Signed)
Obstetric Preoperative History and Physical  Andrea Roberson is a 33 y.o. G2P1001 with IUP at 41w3dpresenting for presenting for scheduled repeat cesarean section with bilateral tubal ligation.  She has poorly controlled gestational diabetes on insulin. No acute concerns.   Prenatal Course Source of Care: [facility] with onset of care at 10 weeks Pregnancy complications or risks: Patient Active Problem List   Diagnosis Date Noted  . Excessive weight gain during pregnancy in third trimester 10/27/2018  . Insulin controlled gestational diabetes mellitus (GDM) in third trimester 10/27/2018  . History of gestational diabetes in prior pregnancy, currently pregnant 04/24/2018  . Obesity, mild 04/24/2018  . History of cesarean delivery affecting pregnancy 04/24/2018  . Dyspepsia   . Constipation 05/12/2017  . Generalized anxiety disorder 01/27/2016  . Shoulder pain, right 12/28/2015  . Obesity (BMI 35.0-39.9 without comorbidity) 12/24/2015  . Bipolar 2 disorder ([place] 12/08/2015   She plans to breastfeed She desires bilateral tubal ligation for postpartum contraception.   Prenatal labs and studies: ABO, Rh: --/--/O POS (01/31 0825) Antibody: NEG (01/31 0825) Rubella: 1.20 (07/23 1459) RPR: Non Reactive (11/20 0905)  HBsAg: Negative (07/23 1459)  HIV: Non Reactive (07/23 1459)  GWEX:HBZJIRCV(01/13 0944) 1 hr Glucola  Abnormal, 229 Genetic screening normal Anatomy [place]   Past Medical History:  Diagnosis Date  . Anxiety   . Asthma   . Bipolar disorder ([place]   . Depression   . Frequent headaches   . GERD (gastroesophageal reflux disease)   . Gestational diabetes   . History of gestational diabetes 2016  . Hyperlipidemia   . Migraines   . Obesity (BMI 35.0-39.9 without comorbidity) 12/24/2015  . Tarsal coalition of left foot   . UTI (urinary tract infection)   . Wears contact lenses     Past Surgical History:  Procedure Laterality Date  . CESAREAN SECTION   2015  . ESOPHAGOGASTRODUODENOSCOPY (EGD) WITH PROPOFOL N/A 08/30/2017   Procedure: ESOPHAGOGASTRODUODENOSCOPY (EGD) WITH PROPOFOL;  Surgeon: VLin Landsman MD;  [place]  Service: Endoscopy;  Laterality: N/A;  . WISDOM TOOTH EXTRACTION      OB History  Gravida Para Term Preterm AB Living  2 1 1     1   SAB TAB Ectopic Multiple Live Births          1    # Outcome Date GA Lbr Len/2nd Weight Sex Delivery Anes PTL Lv  2 Current           1 Term 05/02/14 [id number] g M CS-LTranv Spinal, EPI N LIV     Complications: Gestational diabetes mellitus in childbirth, insulin controlled, Excessive weight gain in pregnancy    Social History   Socioeconomic History  . Marital status: Married    Spouse name: Not on file  . Number of children: Not on file  . Years of education: Not on file  . Highest education level: Not on file  Occupational History  . Not on file  Social Needs  . Financial resource strain: Not on file  . Food insecurity:    Worry: Not on file    Inability: Not on file  . Transportation needs:    Medical: Not on file    Non-medical: Not on file  Tobacco Use  . Smoking status: Former Smoker    Packs/day: 1.50    Years: 8.00    Pack years: 12.00    Types: Cigarettes    Last attempt to quit: 09/16/2012  Years since quitting: 6.1  . Smokeless tobacco: Never Used  Substance and Sexual Activity  . Alcohol use: Not Currently    Comment: 2 glasswine/month  . Drug use: No  . Sexual activity: Not Currently    Birth control/protection: None  Lifestyle  . Physical activity:    Days per week: Not on file    Minutes per session: Not on file  . Stress: Not on file  Relationships  . Social connections:    Talks on phone: Not on file    Gets together: Not on file    Attends religious service: Not on file    Active member of club or organization: Not on file    Attends meetings of clubs or organizations: Not on file    Relationship status:  Not on file  Other Topics Concern  . Not on file  Social History Narrative   Married   1 boy   Pharmacist, hospital- pre [employer]       Family History  Problem Relation Age of Onset  . Heart disease Mother   . Diabetes Mother   . Anxiety disorder Mother   . Arthritis Mother   . Heart disease Father   . Diabetes Father   . Skin cancer Father   . Arthritis Father   . Colon polyps Father   . Peptic Ulcer Disease Father   . Colon polyps Brother   . Colon cancer Paternal Aunt   . Heart disease Maternal Grandmother   . Colon cancer Paternal Grandfather     Facility-Administered Medications Prior to Admission  Medication Dose Route Frequency Provider Last Rate Last Dose  . insulin starter kit- pen needles (English) 1 kit  1 kit Other Once Harlin Heys, MD      . insulin starter kit- syringes (English) 1 kit  1 kit Other Once Harlin Heys, MD       Medications Prior to Admission  Medication Sig Dispense Refill Last Dose  . aspirin EC 81 MG tablet Take 81 mg by mouth daily.   Taking  . cetirizine (ZYRTEC) 10 MG tablet Take 10 mg by mouth daily as needed for allergies.    Taking  . fluticasone (FLONASE) 50 MCG/ACT nasal spray Place 2 sprays into both nostrils daily as needed for allergies.    Taking  . hydrOXYzine (VISTARIL) 25 MG capsule Take 1 capsule (25 mg total) by mouth every 6 (six) hours as needed for itching. 30 capsule 1 Taking  . insulin aspart (NOVOLOG) 100 UNIT/ML injection 23u Q AM    17u Q PM (Patient taking differently: Inject 28-30 Units into the skin 2 (two) times daily. Inject 30 units subcutaneously in the morning and 28 units in the evening) 10 mL 12 Taking  . Prenatal Vit-Fe Fumarate-FA (PRENATAL MULTIVITAMIN) TABS tablet Take 1 tablet by mouth daily at 12 noon.   Taking  . FLUoxetine (PROZAC) 20 MG capsule Take 1 capsule (20 mg total) by mouth daily. 30 capsule 6 Taking  . glucose blood test strip Use as instructed 100 each 12 Taking  . insulin NPH Human (HUMULIN  N,NOVOLIN N) 100 UNIT/ML injection Inject 0.28-0.56 mLs (28-56 Units total) into the skin See admin instructions. Inject 56 units subcutaneously in the morning and 28 units in the evening 10 mL 1 Taking  . Lancets (ONETOUCH ULTRASOFT) lancets Use as instructed 100 each 12 Taking    No Known Allergies  Review of Systems: Negative except for what is mentioned in HPI.  Physical  Exam: BP 115/73   Pulse 84   Temp 98 F (36.7 C) (Oral)   Resp 18   Ht 5' 5"  (1.651 m)   LMP 02/22/2018   BMI 43.43 kg/m  FHR by Doppler: 140 bpm GENERAL: Well-developed, well-nourished female in no acute distress.  LUNGS: Clear to auscultation bilaterally.  HEART: Regular rate and rhythm. ABDOMEN: Soft, nontender, nondistended, gravid, well-healed Pfannenstiel incision. PELVIC: Deferred EXTREMITIES: Nontender, no edema, 2+ distal pulses.   Pertinent Labs/Studies:   Results for orders placed or performed during the hospital encounter of 11/19/18 (from the past 72 hour(s))  Glucose, capillary     Status: Abnormal   Collection Time: 11/19/18  6:21 AM  Result Value Ref Range   Glucose-Capillary 163 (H) 70 - 99 mg/dL    Assessment and Plan :Taleshia KENNIYA WESTRICH is a 33 y.o. G2P1001 at 68w3dbeing admitted  for scheduled cesarean section delivery. The patient is understanding of the planned procedure and is aware of and accepting of all surgical risks, including but not limited to: bleeding which may require transfusion or reoperation; infection which may require antibiotics; injury to bowel, bladder, ureters or other surrounding organs which may require repair; injury to the fetus; need for additional procedures including hysterectomy in the event of life-threatening complications; placental abnormalities wth subsequent pregnancies; incisional problems; blood clot disorders which may require blood thinners;, and other postoperative/anesthesia complications.  Patient also desires permanent sterilization.  Other  reversible forms of contraception were discussed with patient; she declines all other modalities. Risks of procedure discussed with patient including but not limited to: risk of regret, permanence of method, bleeding, infection, injury to surrounding organs and need for additional procedures.  Failure risk of about 1% with increased risk of ectopic gestation if pregnancy occurs was also discussed with patient.  Patient verbalized understanding of these risks and wants to proceed with sterilization.    The patient is in agreement with the proposed plan, and gives informed written consent for the procedure. All questions have been answered. To OR when ready.   CRubie Maid MD [facility]

## 2018-11-19 NOTE — Op Note (Signed)
Cesarean Section Procedure Note  Indications: previous uterine incision (low-transverse C-section x 1), declines vaginal attempt  Pre-operative Diagnosis: 38 week 3 day pregnancy, prior C-section x 1 declines TOLAC, gestational diabetes on insulin (poorly controlled), morbid obesity (BMI 43), excessive weight gain in pregnancy, history of bipolar disorder, desires permanent sterilization.   Post-operative Diagnosis: Same  Surgeon: Hildred Laser, MD  Assistants: Brennan Bailey, MD. No other capable assistant available in surgery requiring high level assistant.  Procedure: Repeat low transverse Cesarean Section with bilateral tubal ligation  Anesthesia: Spinal anesthesia  Procedure Details: The patient was seen in the Holding Room. The risks, benefits, complications, treatment options, and expected outcomes were discussed with the patient.  The patient concurred with the proposed plan, giving informed consent.  The site of surgery properly noted/marked. The patient was taken to the Operating Room, identified as Andrea Roberson and the procedure verified as C-Section Delivery. A Time Out was held and the above information confirmed.  After induction of anesthesia, the patient was draped and prepped in the usual sterile manner. Anesthesia was tested and noted to be adequate. A Pfannenstiel incision was made and carried down through the subcutaneous tissue to the fascia. Fascial incision was made and extended transversely. The fascia was separated from the underlying rectus tissue superiorly and inferiorly. The peritoneum was identified and entered. Peritoneal incision was extended longitudinally. The surgical assist was able to provide retraction to allow for clear visualization of surgical site. The utero-vesical peritoneal reflection was incised transversely and the bladder flap was bluntly freed from the lower uterine segment. A low transverse uterine incision was made. Delivered from cephalic  presentation was a 3370 gram Female with Apgar scores of 8 at one minute and 8 at five minutes.  The assistant was able to apply adequate fundal pressure to allow for successful delivery of the fetus. After the umbilical cord was clamped and cut, cord blood was obtained for evaluation. The placenta was removed intact and appeared normal. The uterus was exteriorized and cleared of all clots and debris. The uterine outline, tubes and ovaries appeared normal.  The uterine incision was closed with a running locked sutures of 0-Vicryl.  A second suture of 0-Vicryl was used to achieve hemostasis at an area in the midline of the incision.  Hemostasis was observed.  Attention was then turned to the fallopian tubes, where the patient's right fallopian tube was identified and grasped with a Babcock clamp.  The tube was then followed out to the fimbria.  The Babcock clamp was then used to grasp the tube approximately 4 cm from the cornual region.  A 3 cm segment of tube was then ligated with a free tie of 0-Chromic using the [place] method and excised.  The left fallopian tube was then ligated in a similar fashion and excised. The tubal lumens were cauterized bilaterally.  Good hemostasis was noted with bilateral fallopian tubes. The uterus was then returned to the abdomen.   The fascia was then reapproximated with a running suture of 0-Vicryl. The subcutaneous fat layer was reapproximated with 2-0 Vicryl. The skin was reapproximated with 4-0 Monocryl.  Instrument, sponge, and needle counts were correct prior the abdominal closure and at the conclusion of the case.   Findings: Female infant, cephalic presentation, 3370 grams, with Apgar scores of 8 at one minute and 8 at five minutes. Intact placenta with 3 vessel cord.  Clear amniotic fluid at amniotomy.  The uterine outline, tubes and ovaries appeared normal.   Estimated  Blood Loss:  525 ml      Drains: foley catheter to gravity drainage, 75 clear urine at  end of the procedure         Total IV Fluids:  900 ml  Specimens: Placenta and fallopian tubes.  Disposition:  Sent to Pathology         Implants: None         Complications:  None; patient tolerated the procedure well.         Disposition: PACU - hemodynamically stable.         Condition: stable   Hildred Laser, MD [facility]

## 2018-11-19 NOTE — Anesthesia Procedure Notes (Signed)
Date/Time: 11/19/2018 8:00 AM Performed by: Ginger Carne, CRNA Pre-anesthesia Checklist: Patient identified, Emergency Drugs available, Suction available, Patient being monitored and Timeout performed Patient Re-evaluated:Patient Re-evaluated prior to induction Oxygen Delivery Method: Nasal cannula Preoxygenation: Pre-oxygenation with 100% oxygen

## 2018-11-19 NOTE — Anesthesia Procedure Notes (Signed)
Spinal  Patient location during procedure: OR Start time: 11/19/2018 7:52 AM End time: 11/19/2018 8:00 AM Staffing Anesthesiologist: Emmie Niemann, MD Resident/CRNA: Johnna Acosta, CRNA Performed: resident/CRNA and anesthesiologist  Preanesthetic Checklist Completed: patient identified, site marked, surgical consent, pre-op evaluation, timeout performed, IV checked, risks and benefits discussed and monitors and equipment checked Spinal Block Patient position: sitting Prep: ChloraPrep Patient monitoring: heart rate, continuous pulse ox, blood pressure and cardiac monitor Approach: midline Location: L4-5 Injection technique: single-shot Needle Needle type: Introducer and Pencil-Tip  Needle gauge: 24 G Needle length: 9 cm Additional Notes Negative paresthesia. Negative blood return. Positive free-flowing CSF. Expiration date of kit checked and confirmed. Patient tolerated procedure well, without complications.

## 2018-11-19 NOTE — Anesthesia Preprocedure Evaluation (Signed)
Anesthesia Evaluation  Patient identified by MRN, date of birth, ID band Patient awake    Reviewed: Allergy & Precautions, NPO status , Patient's Chart, lab work & pertinent test results  History of Anesthesia Complications Negative for: history of anesthetic complications  Airway Mallampati: II  TM Distance: >3 FB Neck ROM: Full    Dental no notable dental hx.    Pulmonary neg sleep apnea, former smoker,    breath sounds clear to auscultation- rhonchi (-) wheezing      Cardiovascular Exercise Tolerance: Good (-) hypertension(-) CAD, (-) Past MI, (-) Cardiac Stents and (-) CABG  Rhythm:Regular Rate:Normal - Systolic murmurs and - Diastolic murmurs    Neuro/Psych  Headaches, PSYCHIATRIC DISORDERS Anxiety Depression Bipolar Disorder    GI/Hepatic Neg liver ROS, GERD  ,  Endo/Other  diabetes, Gestational, Insulin Dependent  Renal/GU negative Renal ROS     Musculoskeletal negative musculoskeletal ROS (+)   Abdominal (+) + obese,   Peds  Hematology negative hematology ROS (+)   Anesthesia Other Findings Past Medical History: No date: Anxiety No date: Asthma No date: Bipolar disorder (HCC) No date: Depression No date: Frequent headaches No date: GERD (gastroesophageal reflux disease) No date: Gestational diabetes 2016: History of gestational diabetes No date: Hyperlipidemia No date: Migraines 12/24/2015: Obesity (BMI 35.0-39.9 without comorbidity) 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 No date: UTI (urinary tract infection) No date: Wears contact lenses   Reproductive/Obstetrics (+) Pregnancy                             Lab Results  Component Value Date   WBC 8.7 11/16/2018   HGB 11.7 (L) 11/16/2018   HCT 37.1 11/16/2018   MCV 87.3 11/16/2018   PLT 279 11/16/2018    Anesthesia Physical Anesthesia Plan  ASA: II  Anesthesia Plan: Spinal   Post-op Pain Management:     Induction:   PONV Risk Score and Plan: 2 and Ondansetron  Airway Management Planned: Natural Airway  Additional Equipment:   Intra-op Plan:   Post-operative Plan:   Informed Consent: I have reviewed the patients History and Physical, chart, labs and discussed the procedure including the risks, benefits and alternatives for the proposed anesthesia with the patient or authorized representative who has indicated his/her understanding and acceptance.     Dental advisory given  Plan Discussed with: CRNA and Anesthesiologist  Anesthesia Plan Comments:         Anesthesia Quick Evaluation

## 2018-11-19 NOTE — Transfer of Care (Signed)
Immediate Anesthesia Transfer of Care Note  Patient: Andrea Roberson  Procedure(s) Performed: CESAREAN SECTION WITH BILATERAL TUBAL LIGATION (N/A )  Patient Location: PACU  Anesthesia Type:Spinal  Level of Consciousness: awake, alert  and oriented  Airway & Oxygen Therapy: Patient Spontanous Breathing  Post-op Assessment: Report given to RN and Post -op Vital signs reviewed and stable  Post vital signs: Reviewed and stable  Last Vitals:  Vitals Value Taken Time  BP 132/76 11/19/2018  9:05 AM  Temp 36.5 C 11/19/2018  9:05 AM  Pulse 80 11/19/2018  9:05 AM  Resp 13 11/19/2018  9:05 AM  SpO2 100 % 11/19/2018  9:05 AM    Last Pain:  Vitals:   11/19/18 0905  TempSrc:   PainSc: 0-No pain         Complications: No apparent anesthesia complications

## 2018-11-20 ENCOUNTER — Encounter: Payer: Self-pay | Admitting: Obstetrics and Gynecology

## 2018-11-20 LAB — CBC
HCT: 33.9 % — ABNORMAL LOW (ref 36.0–46.0)
HEMOGLOBIN: 10.6 g/dL — AB (ref 12.0–15.0)
MCH: 27.5 pg (ref 26.0–34.0)
MCHC: 31.3 g/dL (ref 30.0–36.0)
MCV: 88.1 fL (ref 80.0–100.0)
Platelets: 247 10*3/uL (ref 150–400)
RBC: 3.85 MIL/uL — AB (ref 3.87–5.11)
RDW: 14.8 % (ref 11.5–15.5)
WBC: 10.1 10*3/uL (ref 4.0–10.5)
nRBC: 0 % (ref 0.0–0.2)

## 2018-11-20 LAB — GLUCOSE, CAPILLARY
Glucose-Capillary: 106 mg/dL — ABNORMAL HIGH (ref 70–99)
Glucose-Capillary: 83 mg/dL (ref 70–99)
Glucose-Capillary: 92 mg/dL (ref 70–99)

## 2018-11-20 LAB — SURGICAL PATHOLOGY

## 2018-11-20 NOTE — Anesthesia Postprocedure Evaluation (Signed)
Anesthesia Post Note  Patient: Andrea Roberson  Procedure(s) Performed: CESAREAN SECTION WITH BILATERAL TUBAL LIGATION (N/A )  Patient location during evaluation: Mother Baby Anesthesia Type: Spinal Level of consciousness: awake, awake and alert, oriented and patient cooperative Pain management: pain level controlled Vital Signs Assessment: post-procedure vital signs reviewed and stable Respiratory status: spontaneous breathing, nonlabored ventilation and respiratory function stable Cardiovascular status: stable Postop Assessment: no headache, no backache, able to ambulate, adequate PO intake, no apparent nausea or vomiting and patient able to bend at knees Anesthetic complications: no     Last Vitals:  Vitals:   11/20/18 0300 11/20/18 0747  BP:  122/75  Pulse: 86 90  Resp:  20  Temp:  36.6 C  SpO2: 95% 98%    Last Pain:  Vitals:   11/20/18 0830  TempSrc:   PainSc: 0-No pain                 Nik Gorrell,  Xyon Lukasik R

## 2018-11-20 NOTE — Progress Notes (Signed)
Call to CSW [place] to inform that social consult referral made due to score on Edinburgh post partum depression scale---recent loss of her father, new baby, and not sleeping well.

## 2018-11-20 NOTE — Progress Notes (Signed)
Postpartum Day # 1: Cesarean Delivery (repeat) with BTL.  History of GDM on insulin, depression (h/o bipolar)  Subjective: Patient reports tolerating PO, + flatus and no problems voiding.    Objective: Vital signs in last 24 hours: Temp:  [97.4 F (36.3 C)-98.5 F (36.9 C)] 97.9 F (36.6 C) (02/04 0747) Pulse Rate:  [66-90] 90 (02/04 0747) Resp:  [0-24] 20 (02/04 0747) BP: (97-138)/(55-86) 122/75 (02/04 0747) SpO2:  [95 %-100 %] 98 % (02/04 0747)  Physical Exam:  General: alert and no distress Lungs: clear to auscultation bilaterally Breasts: normal appearance, no masses or tenderness Heart: regular rate and rhythm, S1, S2 normal, no murmur, click, rub or gallop Abdomen: soft, non-tender; bowel sounds normal; no masses,  no organomegaly Pelvis: Lochia appropriate, Uterine Fundus firm, Incision: healing well, no significant drainage, no dehiscence, no significant erythema Extremities: DVT Evaluation: No evidence of DVT seen on physical exam. Negative Homan's sign. No cords or calf tenderness. No significant calf/ankle edema.  Recent Labs    11/20/18 0542  HGB 10.6*  HCT 33.9*    Results for ZAYONNA, TASSO (MRN [id number]) as of 11/20/2018 07:54  Ref. Range 11/19/2018 06:21 11/19/2018 07:31 11/19/2018 15:02 11/19/2018 21:07 11/20/2018 05:23  Glucose-Capillary Latest Ref Range: 70 - 99 mg/dL 269 (H)  95 485 (H) 462 (H)   Assessment/Plan: Status post Cesarean section. Doing well postoperatively.  Breastfeeding and Lactation consult Advance diet as tolerated (continue GDM diet).  Continue accuchecks 4 x daily while inpatient today.   Continue PO pain management Discontinue IVF Continue Prozac postpartum Contraception: BTL Continue current care.  Dispo: Likely d/c home in 1-2 days.   Hildred Laser, MD [facility]

## 2018-11-20 NOTE — Lactation Note (Signed)
This note was copied from a baby's chart. Lactation Consultation Note  Patient Name: Andrea Roberson Today's Date: 11/20/2018 Reason for consult: Initial assessment   Maternal Data Does the patient have breastfeeding experience prior to this delivery?: Yes  Feeding Feeding Type: Breast Fed  LATCH Score Latch: Repeated attempts needed to sustain latch, nipple held in mouth throughout feeding, stimulation needed to elicit sucking reflex.  Audible Swallowing: A few with stimulation  Type of Nipple: Everted at rest and after stimulation  Comfort (Breast/Nipple): Filling, red/small blisters or bruises, mild/mod discomfort  Hold (Positioning): Assistance needed to correctly position infant at breast and maintain latch.  LATCH Score: 6  Interventions Interventions: Breast feeding basics reviewed;Assisted with latch;Adjust position;Support pillows;Position options  Lactation Tools Discussed/Used     Consult Status Consult Status: Follow-up Date: 11/20/18 Follow-up type: In-patient  Mother states that it is more difficult to latch infant on to the right side. Mother states that she can feel a "pinching" feeling when infant latches and can see a crease on her nipple. LC attempted to put infant in a cradle position and sandwiched the breast to attempt a deeper latch. Mother states that she could still feel pinching so infant was moved into the football position and latched. Mother states that she can feel a little pinching but states she does not feel it during the entire session. LC did an oral exam of infant's mouth and did not feel or see any issues that would restrict movement of the tongue. Infant has rhythmic sucks and movement of the tongue when suck was assessed using a gloved finger. LC encouraged mother to keep practicing achieving a deeper latch on the right side and if she continues to experience pain and creasing to let LC know so we can evaluate further.   Arlyss Gandy 11/20/2018, 4:21 PM

## 2018-11-20 NOTE — Anesthesia Post-op Follow-up Note (Signed)
  Anesthesia Pain Follow-up Note  Patient: Andrea Roberson  Day #: 1  Date of Follow-up: 11/20/2018 Time: 9:35 AM  Last Vitals:  Vitals:   11/20/18 0300 11/20/18 0747  BP:  122/75  Pulse: 86 90  Resp:  20  Temp:  36.6 C  SpO2: 95% 98%    Level of Consciousness: alert  Pain: none   Side Effects:None  Catheter Site Exam:clean, dry, no drainage     Plan: D/C from anesthesia care at surgeon's request  Samina Weekes,  Sheran Fava

## 2018-11-21 LAB — GLUCOSE, CAPILLARY
Glucose-Capillary: 122 mg/dL — ABNORMAL HIGH (ref 70–99)
Glucose-Capillary: 126 mg/dL — ABNORMAL HIGH (ref 70–99)

## 2018-11-21 MED ORDER — OXYCODONE-ACETAMINOPHEN 5-325 MG PO TABS: 1.0000 | ORAL_TABLET | ORAL | 0 refills | Status: DC | PRN

## 2018-11-21 MED ORDER — DOCUSATE SODIUM 100 MG PO CAPS: 100.0000 mg | ORAL_CAPSULE | Freq: Two times a day (BID) | ORAL | 2 refills | Status: DC | PRN

## 2018-11-21 MED ORDER — IBUPROFEN 800 MG PO TABS: 800.0000 mg | ORAL_TABLET | Freq: Three times a day (TID) | ORAL | 1 refills | Status: DC | PRN

## 2018-11-21 NOTE — Discharge Summary (Signed)
OB Discharge Summary     Patient Name: Andrea Roberson DOB: 08/27/1986 MRN: [id number]  Date of admission: 11/19/2018 Delivering MD: Hildred Laser   Date of discharge: 11/21/2018  Admitting diagnosis: PRIOR C SECTION, GESTATIONAL DIABETES Intrauterine pregnancy: [redacted]w[redacted]d     Secondary diagnosis:  Principal Problem:   Insulin controlled gestational diabetes mellitus (GDM) in third trimester Active Problems:   Bipolar 2 disorder (HCC)   Obesity (BMI 35.0-39.9 without comorbidity)   Generalized anxiety disorder   History of cesarean delivery affecting pregnancy   Excessive weight gain during pregnancy in third trimester   Gestational diabetes mellitus (GDM) in childbirth, insulin controlled  Additional problems: Grief (recently lost her father last week)     Discharge diagnosis: Term Pregnancy Delivered and GDM A2, Bipolar disorder (with depression episode), grief                                                                                              Post partum procedures:None  Augmentation: None  Complications: None  Hospital course:  Sceduled C/S   33 y.o. yo G2P2002 at [redacted]w[redacted]d was admitted to the hospital 11/19/2018 for scheduled cesarean section with the following indication:Elective Repeat.  Membrane Rupture Time/Date: 8:21 AM ,11/19/2018   Patient delivered a Viable infant.11/19/2018  Details of operation can be found in separate operative note.  Pateint had an uncomplicated postpartum course.  She is ambulating, tolerating a regular diet, passing flatus, and urinating well. Patient is discharged home in stable condition on  11/21/18         Physical exam  Vitals:   11/20/18 0747 11/20/18 1633 11/20/18 1921 11/20/18 2341  BP: 122/75 116/65 125/72 121/75  Pulse: 90 88 92 96  Resp: 20 18 18 18   Temp: 97.9 F (36.6 C) 98.2 F (36.8 C) 98.3 F (36.8 C) 98.2 F (36.8 C)  TempSrc: Oral Oral Oral Oral  SpO2: 98%  96% 98%  Weight:      Height:       General: alert and no  distress Lochia: appropriate Uterine Fundus: firm Incision: Healing well with no significant drainage, No significant erythema DVT Evaluation: No evidence of DVT seen on physical exam. Negative Homan's sign. No cords or calf tenderness. No significant calf/ankle edema.  Labs: Lab Results  Component Value Date   WBC 10.1 11/20/2018   HGB 10.6 (L) 11/20/2018   HCT 33.9 (L) 11/20/2018   MCV 88.1 11/20/2018   PLT 247 11/20/2018   CMP Latest Ref Rng & Units 11/16/2018  Glucose 70 - 99 mg/dL 761(Y)  BUN 6 - 20 mg/dL 12  Creatinine 0.73 - 7.10 mg/dL 6.26  Sodium 948 - 546 mmol/L 133(L)  Potassium 3.5 - 5.1 mmol/L 3.6  Chloride 98 - 111 mmol/L 106  CO2 22 - 32 mmol/L 17(L)  Calcium 8.9 - 10.3 mg/dL 2.7(O)  Total Protein 6.0 - 8.3 g/dL -  Total Bilirubin 0.2 - 1.2 mg/dL -  Alkaline Phos 39 - 350 U/L -  AST 0 - 37 U/L -  ALT 0 - 35 U/L -    Discharge instruction: per After Visit Summary  and "Baby and Me Booklet".  After visit meds:  Allergies as of 11/21/2018   No Known Allergies     Medication List    STOP taking these medications   aspirin EC 81 MG tablet   glucose blood test strip   insulin aspart 100 UNIT/ML injection Commonly known as:  novoLOG   insulin NPH Human 100 UNIT/ML injection Commonly known as:  HUMULIN N,NOVOLIN N   onetouch ultrasoft lancets     TAKE these medications   cetirizine 10 MG tablet Commonly known as:  ZYRTEC Take 10 mg by mouth daily as needed for allergies.   docusate sodium 100 MG capsule Commonly known as:  COLACE Take 1 capsule (100 mg total) by mouth 2 (two) times daily as needed.   FLUoxetine 20 MG capsule Commonly known as:  PROZAC Take 1 capsule (20 mg total) by mouth daily.   fluticasone 50 MCG/ACT nasal spray Commonly known as:  FLONASE Place 2 sprays into both nostrils daily as needed for allergies.   hydrOXYzine 25 MG capsule Commonly known as:  VISTARIL Take 1 capsule (25 mg total) by mouth every 6 (six) hours as  needed for itching.   ibuprofen 800 MG tablet Commonly known as:  ADVIL,MOTRIN Take 1 tablet (800 mg total) by mouth every 8 (eight) hours as needed.   oxyCODONE-acetaminophen 5-325 MG tablet Commonly known as:  PERCOCET/ROXICET Take 1-2 tablets by mouth every 4 (four) hours as needed for moderate pain.   prenatal multivitamin Tabs tablet Take 1 tablet by mouth daily at 12 noon.       Diet: carb modified diet  Activity: Advance as tolerated. Pelvic rest for 6 weeks.   Outpatient follow up:2 weeks Follow up Appt: Future Appointments  Date Time Provider Department Center  11/27/2018  9:30 AM Linzie Collin, MD [facility] None   Follow up Visit:No follow-ups on file.  Postpartum contraception: Tubal Ligation  Newborn Data: Live born female  Birth Weight: 7 lb 6.9 oz (3370 g) APGAR: 8, 8  Newborn Delivery   Birth date/time:  11/19/2018 08:22:00 Delivery type:  C-Section, Low Transverse Trial of labor:  No C-section categorization:  Repeat     Baby Feeding: Breast Disposition:home with mother   11/21/2018 Hildred Laser, MD

## 2018-11-21 NOTE — Progress Notes (Signed)
Patient discharged home with infant. Discharge instructions, prescriptions and follow up appointment given to and reviewed with patient. Patient verbalized understanding. Wheeled out with infant by auxiliary.  

## 2018-11-21 NOTE — Clinical Social Work Maternal (Signed)
  CLINICAL SOCIAL WORK MATERNAL/CHILD NOTE  Patient Details  Name: Andrea Roberson MRN: [id number] Date of Birth: 07-08-1986  Date:  11/21/2018  Clinical Social Worker Initiating Note:  Shela Leff MSW,LCSW Date/Time: Initiated:  11/21/18/      Child's Name:      Biological Parents:  Mother, Father   Need for Interpreter:  None   Reason for Referral:      Address:  [place] [address]    Phone number:  [phone] (home)     Additional phone number: none  Household Members/Support Persons (HM/SP):       HM/SP Name Relationship DOB or Age  HM/SP -1        HM/SP -2        HM/SP -3        HM/SP -4        HM/SP -5        HM/SP -6        HM/SP -7        HM/SP -8          Natural Supports (not living in the home):  Friends, Immediate Family   Professional Supports:     Employment:     Type of Work:     Education:      Homebound arranged:    Pensions consultant:  Multimedia programmer   Other Resources:      Cultural/Religious Considerations Which May Impact Care:  none  Strengths:  Ability to meet basic needs , Compliance with medical plan , Home prepared for child    Psychotropic Medications:         Pediatrician:       Pediatrician List:   [place]      Pediatrician Fax Number:    Risk Factors/Current Problems:  Mental Health Concerns    Cognitive State:  Alert , Able to Concentrate , Goal Oriented    Mood/Affect:  Calm , Happy    CSW Assessment: CSW spoke with patient due to consult for high score on Edinburgh scale. CSW met with patient and her husband this morning and introduced self and explained role and purpose of visit. Patient and husband have all necessities for their newborn and they both report having a lot of familial and friend support.  Patient allowed husband to remain present during all of assessment. CSW confirmed  patient is dealing with a recent loss in her family and patient acknowledges she scored high on the depression scale. Patient denies having thoughts of self harm and states she just started prozac and has taken prozac in the past. She states that prozac worked for her in the past and so she is hopeful it will work for her again. She knows to contact her physician or 911 in the event of an emergency and states that she had a great support system. Her husband is taking off the entire month of February to stay home with her and their newborn.  CSW Plan/Description:  No Further Intervention Required/No Barriers to Discharge    Shela Leff, LCSW 11/21/2018, 11:15 AM

## 2018-11-21 NOTE — Lactation Note (Signed)
This note was copied from a baby's chart. Lactation Consultation Note  Patient Name: Andrea Roberson [id number] Date: 11/21/2018 Reason for consult: Follow-up assessment   Maternal Data    Feeding Feeding Type: Breast Fed  LATCH Score Latch: Grasps breast easily, tongue down, lips flanged, rhythmical sucking.  Audible Swallowing: Spontaneous and intermittent  Type of Nipple: Everted at rest and after stimulation  Comfort (Breast/Nipple): Filling, red/small blisters or bruises, mild/mod discomfort  Hold (Positioning): No assistance needed to correctly position infant at breast.  LATCH Score: 9  Interventions Interventions: Breast feeding basics reviewed;Assisted with latch  Lactation Tools Discussed/Used     Consult Status Consult Status: Complete Date: 11/21/18 Follow-up type: In-patient  Pt is a breastfeeding peer counselor and states that breastfeeding is going well but has some soreness on her rt nipple. LC evaluated latch and baby does flange out both lips on areola. Baby appears to just have a stronger suctions. Mom has comfort gels and coconut oil to assist.   Burnadette Peter 11/21/2018, 12:07 PM

## 2018-11-21 NOTE — Progress Notes (Signed)
Postpartum Day # 2: Cesarean Delivery (repeat) with BTL.  History of GDM on insulin, depression (h/o bipolar)  Subjective: Patient reports tolerating PO, + flatus, + BM and no problems voiding.  Pain controlled with pain medications. Notes difficulty sleeping last night (may be due to recent passing of father in addition to having a newborn infant who cluster-fed last night). Overall though she notes she is feeling ok.  Objective: Vital signs in last 24 hours: Temp:  [98.2 F (36.8 C)-98.3 F (36.8 C)] 98.2 F (36.8 C) (02/04 2341) Pulse Rate:  [88-96] 96 (02/04 2341) Resp:  [18] 18 (02/04 2341) BP: (116-125)/(65-75) 121/75 (02/04 2341) SpO2:  [96 %-98 %] 98 % (02/04 2341)  Physical Exam:  General: alert and no distress Lungs: clear to auscultation bilaterally Breasts: normal appearance, no masses or tenderness Heart: regular rate and rhythm, S1, S2 normal, no murmur, click, rub or gallop Abdomen: soft, non-tender; bowel sounds normal; no masses,  no organomegaly Pelvis: Lochia appropriate, Uterine Fundus firm, Incision: healing well, no significant drainage, no dehiscence, no significant erythema Extremities: DVT Evaluation: No evidence of DVT seen on physical exam. Negative Homan's sign. No cords or calf tenderness. No significant calf/ankle edema.  Recent Labs    11/20/18 0542  HGB 10.6*  HCT 33.9*     Assessment/Plan: Status post Cesarean section. Doing well postoperatively.  Breastfeeding and s/p Lactation consult Continue GDM diet Continue PO pain management Continue Prozac postpartum (just initiated last week with the passing of her father).  Is experiencing normal grief reaction in addition to some sleep deprivation with new infant. Will f/u at 2 week postpartum check Contraception: s/p BTL Continue current care.  Dispo:home today  Hildred Laser, MD [facility]

## 2018-11-23 ENCOUNTER — Telehealth: Payer: Self-pay | Admitting: Obstetrics and Gynecology

## 2018-11-23 NOTE — Telephone Encounter (Signed)
Patient called to cancel her post op for next Tuesday. She stated Dr Valentino Saxon wanted her to be scheduled with her a week in 2 weeks however Valentino Saxon has nothing open on her schedule. I told the patient we would call her back to let her know where we could schedule her. Thanks

## 2018-11-27 ENCOUNTER — Ambulatory Visit: Payer: Self-pay

## 2018-11-27 ENCOUNTER — Encounter: Payer: Self-pay | Admitting: Obstetrics and Gynecology

## 2018-11-27 NOTE — Lactation Note (Signed)
This note was copied from a baby's chart. Lactation Consultation Note  Patient Name: Andrea Roberson APOLI'D Date: 11/27/2018     Maternal Data    Feeding    LATCH Score                   Interventions    Lactation Tools Discussed/Used     Consult Status  Mother (Andrea Roberson) and Andrea Roberson are here today for a lactation outpatient consult. Mother states that her nipples were sore, cracked, bleeding and looked pinched after breastfeeding. Her nipples are now healed. Mother breastfed Andrea Roberson on the right side in the cradle position for approximately 15-20 minutes. LC was able to adjust Andrea Roberson's positioning and had to flanged her lips out. Mother states she felt less pain after these adjustments. Andrea Roberson's weight pre-feeding was 3458 grams and post feeding weight was 3526 grams. Post weight revealed that Andrea Roberson took approximately 68 mL on the right side. Andrea Roberson seemed satisfied after the feeding on one breast and was attempted on the left breast but she would not take it. Mother's right nipple was pinched after the feeding. LC gave mother tips on how she can achieve a deep latch. Oral assessment revealed that Andrea Roberson could possibly have a tight frenulum under the tongue. Mother was given a nipple shield to assist as a barrier if she starts to experience pain. Mother was also given information on local Providers to evaluate Andrea Roberson's tongue if the pain while breastfeeding does not improve. Plan for mother and Andrea Roberson are for mother to continue breastfeeding with correct positioning, making sure that Andrea Roberson is parallel and turned belly to belly while in the cradle position, sandwich the breast to get a deeper latch and make sure Andrea Roberson's top and bottom lip are flanged by pulling down on the chin and rolling out the top lip. If breastfeeding is not effective and Andrea Roberson is not satisfied, mother is to pump and supplement milk with a bottle. If pain with breastfeeding gets worse seek a follow-up appointment with [facility] or  evaluation with Provider regarding an oral assessment for Andrea Roberson. Mother verbalized understanding of teachings and denies additional questions or concerns.    Arlyss Gandy 11/27/2018, 9:57 AM

## 2018-12-06 ENCOUNTER — Encounter: Payer: Self-pay | Admitting: Obstetrics and Gynecology

## 2018-12-06 ENCOUNTER — Ambulatory Visit (INDEPENDENT_AMBULATORY_CARE_PROVIDER_SITE_OTHER): Payer: BC Managed Care – PPO | Admitting: Obstetrics and Gynecology

## 2018-12-06 VITALS — BP 125/80 | HR 87 | Ht 65.0 in | Wt 236.4 lb

## 2018-12-06 DIAGNOSIS — Z4889 Encounter for other specified surgical aftercare: Secondary | ICD-10-CM

## 2018-12-06 DIAGNOSIS — Z98891 History of uterine scar from previous surgery: Secondary | ICD-10-CM

## 2018-12-06 NOTE — Progress Notes (Signed)
    OBSTETRICS/GYNECOLOGY POST-OPERATIVE CLINIC VISIT  Subjective:     Andrea Roberson is a 33 y.o. G75P2002 female who presents to the clinic 1 weeks status post repeat C-section with BTL. Eating a regular diet without difficulty. Bowel movements are normal. Pain is controlled with current analgesics. Medications being used: prescription NSAID's including ibuprofen (Motrin) and narcotic analgesics including Percocet.(occasionally).   The following portions of the patient's history were reviewed and updated as appropriate: allergies, current medications, past family history, past medical history, past social history, past surgical history and problem list.  Review of Systems Pertinent items noted in HPI and remainder of comprehensive ROS otherwise negative.    Objective:    BP 125/80   Pulse 87   Ht 5\' 5"  (1.651 m)   Wt 236 lb 6.4 oz (107.2 kg)   Breastfeeding Yes   BMI 39.34 kg/m  General:  alert and no distress  Abdomen: soft, bowel sounds active, non-tender  Incision:   healing well, no drainage, no erythema, no hernia, no seroma, no swelling, no dehiscence, incision well approximated     Assessment:    Doing well postoperatively.  S/p cesarean section  Plan:   1. Continue any current medications as needed. Can drive if not using narcotic pain meds.  2. Wound care discussed. 3. Operative findings again reviewed. Pathology report discussed. 4. Activity restrictions: no lifting more than 15 pounds and pelvic rest 5. Anticipated return to work: 5-6 weeks. 6. Follow up: 5 weeks for postpartum visit.    Hildred Laser, MD [facility]

## 2018-12-06 NOTE — Progress Notes (Signed)
Pt is present today for c-section wound check. Pt stated that the incisions are healing well. No problems.

## 2018-12-07 ENCOUNTER — Telehealth: Payer: Self-pay

## 2018-12-07 NOTE — Telephone Encounter (Signed)
Pt called to inform her that the disability forms she dropped off was completed and ready for her to pick up. Pt is aware that her forms are up front for pick up.

## 2018-12-28 ENCOUNTER — Ambulatory Visit: Payer: BC Managed Care – PPO | Admitting: Family

## 2018-12-28 ENCOUNTER — Telehealth: Payer: Self-pay

## 2018-12-28 DIAGNOSIS — H65112 Acute and subacute allergic otitis media (mucoid) (sanguinous) (serous), left ear: Secondary | ICD-10-CM

## 2018-12-28 DIAGNOSIS — H9201 Otalgia, right ear: Secondary | ICD-10-CM

## 2018-12-28 MED ORDER — AMOXICILLIN 500 MG PO CAPS: 500.0000 mg | ORAL_CAPSULE | Freq: Two times a day (BID) | ORAL | 0 refills | Status: DC

## 2018-12-28 NOTE — Telephone Encounter (Signed)
Call pt  I have sent amoxicillin which is safe to take while breast-feeding.  Of note, nurse did a great job in triaging patient.  If current climate, and with a infant, patient whom is breast-feeding, I think is safer patient to stay home.  Trial amoxicillin.  She will stay vigilant let me know if no improvement.

## 2018-12-28 NOTE — Telephone Encounter (Signed)
I called patient it triage before her appointment today. She stated that she had done no traveling or been in contact with anyone sick. She did have ear pain when swallowing & her throat was somewhat sore. She mostly has cold symptoms(runny nose etc.). She had called her OB/GYN & asked what was safe to take suring breast feeding, since she has a 22 month old. She has been taking zyrtec, mucinex & tylenol as needed. None of these medications have helped. She wanted to make sure if antibiotic ws prescribed that it was safe for breast feeding.

## 2018-12-28 NOTE — Telephone Encounter (Signed)
Patient called. I notified her amoxicillin was sent in & was safe for breast feeding.

## 2018-12-28 NOTE — Addendum Note (Signed)
Addended by: Allegra Grana on: 12/28/2018 10:39 AM   Modules accepted: Orders

## 2019-01-09 ENCOUNTER — Telehealth: Payer: Self-pay

## 2019-01-09 NOTE — Telephone Encounter (Signed)
Pt was called to do prescreening before her visit. Pt was asked if she had a dry cough, SOB, difficulty breathing, upset stomach. Pt was informed that we are not allowing any visitors. Pt stated that she was not having any of the above symptoms. Pt stated that she understood when it came to no visitors.

## 2019-01-10 ENCOUNTER — Ambulatory Visit (INDEPENDENT_AMBULATORY_CARE_PROVIDER_SITE_OTHER): Payer: BC Managed Care – PPO | Admitting: Obstetrics and Gynecology

## 2019-01-10 ENCOUNTER — Encounter: Payer: Self-pay | Admitting: Obstetrics and Gynecology

## 2019-01-10 ENCOUNTER — Other Ambulatory Visit: Payer: Self-pay

## 2019-01-10 DIAGNOSIS — Z1389 Encounter for screening for other disorder: Secondary | ICD-10-CM | POA: Diagnosis not present

## 2019-01-10 DIAGNOSIS — F411 Generalized anxiety disorder: Secondary | ICD-10-CM

## 2019-01-10 DIAGNOSIS — F3181 Bipolar II disorder: Secondary | ICD-10-CM

## 2019-01-10 DIAGNOSIS — Z8632 Personal history of gestational diabetes: Secondary | ICD-10-CM

## 2019-01-10 DIAGNOSIS — O9081 Anemia of the puerperium: Secondary | ICD-10-CM

## 2019-01-10 MED ORDER — FLUOXETINE HCL 40 MG PO CAPS: 40.0000 mg | ORAL_CAPSULE | Freq: Every day | ORAL | 6 refills | Status: DC

## 2019-01-10 NOTE — Progress Notes (Signed)
PT is present today for her postpartum visit.  Pt had a c-section is also here for wound check. Pt stated that she is breastfeeding and have not had sexually intercourse since the birth of the baby. Pt stated that that she had a tubal done during her c-section. EPDS= 16.  Pt stated that she is doing well no complaints.

## 2019-01-10 NOTE — Progress Notes (Signed)
   OBSTETRICS POSTPARTUM CLINIC PROGRESS NOTE  Subjective:     Andrea Roberson is a 33 y.o. G73P2002 female who presents for a postpartum visit. She is 6 weeks postpartum following a low cervical transverse Cesarean section. I have fully reviewed the prenatal and intrapartum course. Pregnancy was complicated by uncontrolled gestational diabetes (on insulin). The delivery was at 38 gestational weeks.  Anesthesia: spinal. Postpartum course has been well. Baby's course has been well. Baby is feeding by breast. Bleeding: patient has not resumed menses, with No LMP recorded. Bowel function is normal. Bladder function is normal. Patient is not sexually active. Contraception method desired is tubal ligation. Postpartum depression screening: positive (EPDS score 16), however states that she thinks her anxiety is actually worse than her depression.  Patient currently on Prozac 20 mg, initiated 2 weeks prior to delivery. She has a h/o bipolar disorder and anxiety.   The following portions of the patient's history were reviewed and updated as appropriate: allergies, current medications, past family history, past medical history, past social history, past surgical history and problem list.  Review of Systems Pertinent items noted in HPI and remainder of comprehensive ROS otherwise negative.   Objective:    BP 116/87   Pulse (!) 128   Ht 5\' 5"  (1.651 m)   Wt 230 lb 11.2 oz (104.6 kg)   BMI 38.39 kg/m   Repeat pulse 101 manually.   General:  alert and no distress   Breasts:  inspection negative, no nipple discharge or bleeding, no masses or nodularity palpable  Lungs: clear to auscultation bilaterally  Heart:  regular rate and rhythm, S1, S2 normal, no murmur, click, rub or gallop  Abdomen: soft, non-tender; bowel sounds normal; no masses,  no organomegaly.  Well healed Pfannenstiel incision   Vulva:  normal  Vagina: normal vagina, no discharge, exudate, lesion, or erythema  Cervix:  no cervical  motion tenderness and no lesions  Corpus: normal size, contour, position, consistency, mobility, non-tender  Adnexa:  normal adnexa and no mass, fullness, tenderness  Rectal Exam: Not performed.         Labs:  Lab Results  Component Value Date   HGB 10.6 (L) 11/20/2018     Assessment:    Postpartum examination following cesarean delivery  History of gestational diabetes  Bipolar 2 disorder (HCC)  Generalized anxiety disorder  Postpartum anemia  Plan:   1. Contraception: tubal ligation performed at time of delivery.  2. H/o GDM on insulin.  Will have patient return in 2 months for annual exam and will check a HgbA1c at that time.   3. Will increase patient's Prozac to 40 mg (notes her highest treatment dose has been 60 mg).  4. Mild anemia postpartum, over Hgb of 10, no need to recheck at this time.  5.  Can resume all activities. Notice given for patient to return to work.  6. Follow up in: 2 months for annual exam, or sooner as needed.    Hildred Laser, MD [facility]

## 2019-02-04 DIAGNOSIS — O99345 Other mental disorders complicating the puerperium: Secondary | ICD-10-CM

## 2019-02-04 DIAGNOSIS — F418 Other specified anxiety disorders: Secondary | ICD-10-CM

## 2019-02-04 DIAGNOSIS — F319 Bipolar disorder, unspecified: Secondary | ICD-10-CM

## 2019-02-07 MED ORDER — BUSPIRONE HCL 5 MG PO TABS: 5.0000 mg | ORAL_TABLET | Freq: Three times a day (TID) | ORAL | 3 refills | Status: DC

## 2019-02-07 NOTE — Addendum Note (Signed)
Addended by: Fabian November on: 02/07/2019 12:25 PM   Modules accepted: Orders

## 2019-02-27 ENCOUNTER — Ambulatory Visit (INDEPENDENT_AMBULATORY_CARE_PROVIDER_SITE_OTHER): Payer: BC Managed Care – PPO | Admitting: Licensed Clinical Social Worker

## 2019-02-27 ENCOUNTER — Other Ambulatory Visit: Payer: Self-pay

## 2019-02-27 DIAGNOSIS — F411 Generalized anxiety disorder: Secondary | ICD-10-CM | POA: Diagnosis not present

## 2019-03-14 NOTE — Progress Notes (Signed)
[facility] Initial Clinical Assessment  MRN: [id number] NAME: Andrea Roberson Date: 02/27/19   Total time: 1 hour  Type of Contact:  virtual Patient consent obtained:  yes Reason for Visit today:  establish care  Treatment History Patient recently received Inpatient Treatment:  denies  Facility/Program:    Date of discharge:   Patient currently being seen by therapist/psychiatrist:  denies Patient currently receiving the following services:  denies  Past Psychiatric History/Hospitalization(s): Anxiety: Yes Bipolar Disorder: No Depression: No Mania: No Psychosis: No Schizophrenia: No Personality Disorder: No Hospitalization for psychiatric illness: No History of Electroconvulsive Shock Therapy: No Prior Suicide Attempts: No  Clinical Assessment:  PHQ-9 Assessments: Depression screen [facility] 2/9 09/21/2018 05/04/2018 02/19/2018  Decreased Interest 1 0 1  Down, Depressed, Hopeless 1 0 2  PHQ - 2 Score 2 0 3  Altered sleeping 0 - 2  Tired, decreased energy 2 - 3  Change in appetite 0 - 1  Feeling bad or failure about yourself  1 - 2  Trouble concentrating 0 - 1  Moving slowly or fidgety/restless 0 - 1  Suicidal thoughts 0 - 0  PHQ-9 Score 5 - 13  Difficult doing work/chores Not difficult at all - Very difficult    GAD-7 Assessments: No flowsheet data found.   Social Functioning Social maturity:  stated age Social judgement:  stated age  Stress Current stressors:  mother living in home, CoVid 16 Familial stressors:  mother in the home, son's behavior Sleep:  poor Appetite:  poor  Coping ability:  poor Patient taking medications as prescribed:    Current medications:  Outpatient Encounter Medications as of 02/27/2019  Medication Sig  . busPIRone (BUSPAR) 5 MG tablet Take 1 tablet (5 mg total) by mouth 3 (three) times daily.  . cetirizine (ZYRTEC) 10 MG tablet Take 10 mg by mouth daily as needed for allergies.   Marland Kitchen docusate sodium (COLACE) 100 MG  capsule Take 1 capsule (100 mg total) by mouth 2 (two) times daily as needed.  Marland Kitchen FLUoxetine (PROZAC) 40 MG capsule Take 1 capsule (40 mg total) by mouth daily.  . fluticasone (FLONASE) 50 MCG/ACT nasal spray Place 2 sprays into both nostrils daily as needed for allergies.    No facility-administered encounter medications on file as of 02/27/2019.     Self-harm Behaviors Risk Assessment Self-harm risk factors:   Patient endorses recent thoughts of harming self:    [place] Suicide Severity Rating Scale: No flowsheet data found.  Danger to Others Risk Assessment Danger to others risk factors:   Patient endorses recent thoughts of harming others:    Dynamic Appraisal of Situational Aggression (DASA): No flowsheet data found.  Substance Use Assessment Patient recently consumed alcohol:    Alcohol Use Disorder Identification Test (AUDIT): No flowsheet data found. Patient recently used drugs:    Opioid Risk Assessment:  Patient is concerned about dependence or abuse of substances:    ASAM Multidimensional Assessment Summary:  Dimension 1:    Dimension 1 Rating:    Dimension 2:    Dimension 2 Rating:    Dimension 3:    Dimension 3 Rating:    Dimension 4:    Dimension 4 Rating:    Dimension 5:    Dimension 5 Rating:    Dimension 6:    Dimension 6 Rating:   ASAM's Severity Rating Score:   ASAM Recommended Level of Treatment:     Goals, Interventions and Follow-up Plan Goals: Increase healthy adjustment to current life  circumstances and Increase adequate support systems for patient/family Interventions: Motivational Interviewing and Brief CBT Follow-up Plan: [facility] to [facility]  Summary of Clinical Assessment Summary: Andrea Roberson is 33  Yr old married, with 1 child lives in [place]. She has had therapy in the past with this Clinician.  She reports classic anxiety symptoms: constant worry, restlessness, poor appetite and sleep.  Her father died earlier this year and now  her mother lives in the home. She has difficulty with her son who has behavior concerns.  She has support of her husband.  Denies current soical activities.  Currently working at [employer].    Andrea Roberson , LCSW

## 2019-04-02 ENCOUNTER — Other Ambulatory Visit: Payer: Self-pay

## 2019-04-02 ENCOUNTER — Ambulatory Visit: Payer: BC Managed Care – PPO | Admitting: Licensed Clinical Social Worker

## 2019-04-03 ENCOUNTER — Telehealth: Payer: Self-pay

## 2019-04-03 NOTE — Patient Instructions (Addendum)
Your Prozac dose has been increased to 60 mg daily.    Preventive Care 18-39 Years, Female Preventive care refers to lifestyle choices and visits with your health care provider that can promote health and wellness. What does preventive care include?   A yearly physical exam. This is also called an annual well check.  Dental exams once or twice a year.  Routine eye exams. Ask your health care provider how often you should have your eyes checked.  Personal lifestyle choices, including: ? Daily care of your teeth and gums. ? Regular physical activity. ? Eating a healthy diet. ? Avoiding tobacco and drug use. ? Limiting alcohol use. ? Practicing safe sex. ? Taking vitamin and mineral supplements as recommended by your health care provider. What happens during an annual well check? The services and screenings done by your health care provider during your annual well check will depend on your age, overall health, lifestyle risk factors, and family history of disease. Counseling Your health care provider may ask you questions about your:  Alcohol use.  Tobacco use.  Drug use.  Emotional well-being.  Home and relationship well-being.  Sexual activity.  Eating habits.  Work and work Statistician.  Method of birth control.  Menstrual cycle.  Pregnancy history. Screening You may have the following tests or measurements:  Height, weight, and BMI.  Diabetes screening. This is done by checking your blood sugar (glucose) after you have not eaten for a while (fasting).  Blood pressure.  Lipid and cholesterol levels. These may be checked every 5 years starting at age 35.  Skin check.  Hepatitis C blood test.  Hepatitis B blood test.  Sexually transmitted disease (STD) testing.  BRCA-related cancer screening. This may be done if you have a family history of breast, ovarian, tubal, or peritoneal cancers.  Pelvic exam and Pap test. This may be done every 3 years  starting at age 15. Starting at age 53, this may be done every 5 years if you have a Pap test in combination with an HPV test. Discuss your test results, treatment options, and if necessary, the need for more tests with your health care provider. Vaccines Your health care provider may recommend certain vaccines, such as:  Influenza vaccine. This is recommended every year.  Tetanus, diphtheria, and acellular pertussis (Tdap, Td) vaccine. You may need a Td booster every 10 years.  Varicella vaccine. You may need this if you have not been vaccinated.  HPV vaccine. If you are 22 or younger, you may need three doses over 6 months.  Measles, mumps, and rubella (MMR) vaccine. You may need at least one dose of MMR. You may also need a second dose.  Pneumococcal 13-valent conjugate (PCV13) vaccine. You may need this if you have certain conditions and were not previously vaccinated.  Pneumococcal polysaccharide (PPSV23) vaccine. You may need one or two doses if you smoke cigarettes or if you have certain conditions.  Meningococcal vaccine. One dose is recommended if you are age 66-21 years and a first-year college student living in a residence hall, or if you have one of several medical conditions. You may also need additional booster doses.  Hepatitis A vaccine. You may need this if you have certain conditions or if you travel or work in places where you may be exposed to hepatitis A.  Hepatitis B vaccine. You may need this if you have certain conditions or if you travel or work in places where you may be exposed to hepatitis B.  Haemophilus influenzae type b (Hib) vaccine. You may need this if you have certain risk factors. Talk to your health care provider about which screenings and vaccines you need and how often you need them. This information is not intended to replace advice given to you by your health care provider. Make sure you discuss any questions you have with your health care  provider. Document Released: 11/29/2001 Document Revised: 05/16/2017 Document Reviewed: 08/04/2015 Elsevier Interactive Patient Education  2019 [place] Breast self-awareness means:  Knowing how your breasts look.  Knowing how your breasts feel.  Checking your breasts every month for changes.  Telling your doctor if you notice a change in your breasts. Breast self-awareness allows you to notice a breast problem early while it is still small. How to do a breast self-exam One way to learn what is normal for your breasts and to check for changes is to do a breast self-exam. To do a breast self-exam: Look for Changes  1. Take off all the clothes above your waist. 2. Stand in front of a mirror in a room with good lighting. 3. Put your hands on your hips. 4. Push your hands down. 5. Look at your breasts and nipples in the mirror to see if one breast or nipple looks different than the other. Check to see if: ? The shape of one breast is different. ? The size of one breast is different. ? There are wrinkles, dips, and bumps in one breast and not the other. 6. Look at each breast for changes in your skin, such as: ? Redness. ? Scaly areas. 7. Look for changes in your nipples, such as: ? Liquid around the nipples. ? Bleeding. ? Dimpling. ? Redness. ? A change in where the nipples are. Feel for Changes 1. Lie on your back on the floor. 2. Feel each breast. To do this, follow these steps: ? Pick a breast to feel. ? Put the arm closest to that breast above your head. ? Use your other arm to feel the nipple area of your breast. Feel the area with the pads of your three middle fingers by making small circles with your fingers. For the first circle, press lightly. For the second circle, press harder. For the third circle, press even harder. ? Keep making circles with your fingers at the light, harder, and even harder pressures as you move down your breast. Stop  when you feel your ribs. ? Move your fingers a little toward the center of your body. ? Start making circles with your fingers again, this time going up until you reach your collarbone. ? Keep making up and down circles until you reach your armpit. Remember to keep using the three pressures. ? Feel the other breast in the same way. 3. Sit or stand in the shower or tub. 4. With soapy water on your skin, feel each breast the same way you did in step 2, when you were lying on the floor. Write Down What You Find After doing the self-exam, write down:  What is normal for each breast.  Any changes you find in each breast.  When you last had your period.  How often should I check my breasts? Check your breasts every month. If you are breastfeeding, the best time to check them is after you feed your baby or after you use a breast pump. If you get periods, the best time to check your breasts is 5-7 days after your period is over.  When should I see my doctor? See your doctor if you notice:  A change in shape or size of your breasts or nipples.  A change in the skin of your breast or nipples, such as red or scaly skin.  Unusual fluid coming from your nipples.  A lump or thick area that was not there before.  Pain in your breasts.  Anything that concerns you. This information is not intended to replace advice given to you by your health care provider. Make sure you discuss any questions you have with your health care provider. Document Released: 03/21/2008 Document Revised: 03/10/2016 Document Reviewed: 08/23/2015 Elsevier Interactive Patient Education  2019 [employer].

## 2019-04-03 NOTE — Telephone Encounter (Signed)
Pt prescreened no symptoms has face mask.   Coronavirus (COVID-19) Are you at risk?  Are you at risk for the Coronavirus (COVID-19)?  To be considered HIGH RISK for Coronavirus (COVID-19), you have to meet the following criteria:  . Traveled to [place], [place], [place], [place] or [place]; or in the [place] to [place], [place], [place], or [place]; and have fever, cough, and shortness of breath within the last 2 weeks of travel OR . Been in close contact with a person diagnosed with COVID-19 within the last 2 weeks and have fever, cough, and shortness of breath . IF YOU DO NOT MEET THESE CRITERIA, YOU ARE CONSIDERED LOW RISK FOR COVID-19.  What to do if you are HIGH RISK for COVID-19?  . If you are having a medical emergency, call 911. . Seek medical care right away. Before you go to a doctor's office, urgent care or emergency department, call ahead and tell them about your recent travel, contact with someone diagnosed with COVID-19, and your symptoms. You should receive instructions from your physician's office regarding next steps of care.  . When you arrive at healthcare provider, tell the healthcare staff immediately you have returned from visiting [place], [place], [place], [place] or [place]; or traveled in the [place] to [place], [place], [place], or [place]; in the last two weeks or you have been in close contact with a person diagnosed with COVID-19 in the last 2 weeks.   . Tell the health care staff about your symptoms: fever, cough and shortness of breath. . After you have been seen by a medical provider, you will be either: o Tested for (COVID-19) and discharged home on quarantine except to seek medical care if symptoms worsen, and asked to  - Stay home and avoid contact with others until you get your results (4-5 days)  - Avoid travel on public transportation if possible (such as bus, train, or airplane) or o Sent to the Emergency Department by EMS for  evaluation, COVID-19 testing, and possible admission depending on your condition and test results.  What to do if you are LOW RISK for COVID-19?  Reduce your risk of any infection by using the same precautions used for avoiding the common cold or flu:  . Wash your hands often with soap and warm water for at least 20 seconds.  If soap and water are not readily available, use an alcohol-based hand sanitizer with at least 60% alcohol.  . If coughing or sneezing, cover your mouth and nose by coughing or sneezing into the elbow areas of your shirt or coat, into a tissue or into your sleeve (not your hands). . Avoid shaking hands with others and consider head nods or verbal greetings only. . Avoid touching your eyes, nose, or mouth with unwashed hands.  . Avoid close contact with people who are sick. . Avoid places or events with large numbers of people in one location, like concerts or sporting events. . Carefully consider travel plans you have or are making. . If you are planning any travel outside or inside the [place], visit the CDC's Travelers' Health webpage for the latest health notices. . If you have some symptoms but not all symptoms, continue to monitor at home and seek medical attention if your symptoms worsen. . If you are having a medical emergency, call 911.   ADDITIONAL HEALTHCARE OPTIONS FOR PATIENTS  [place] [facility] / e-Visit: [email or web address]           [facility]: [phone]   Urgent Care: [phone]                   [facility]: [phone]  

## 2019-04-03 NOTE — Telephone Encounter (Signed)
Pt called no answer LM via voicemail to call the office for prescreening. 

## 2019-04-03 NOTE — Progress Notes (Signed)
Pt is present today for annual exam. Pt stated that she has noticed some burning when she urinate.  GAD-7= 19. PHQ-9= 16.

## 2019-04-04 ENCOUNTER — Encounter: Payer: Self-pay | Admitting: Obstetrics and Gynecology

## 2019-04-04 ENCOUNTER — Other Ambulatory Visit: Payer: Self-pay

## 2019-04-04 ENCOUNTER — Ambulatory Visit (INDEPENDENT_AMBULATORY_CARE_PROVIDER_SITE_OTHER): Payer: BC Managed Care – PPO | Admitting: Obstetrics and Gynecology

## 2019-04-04 VITALS — BP 119/87 | HR 78 | Ht 65.0 in | Wt 232.7 lb

## 2019-04-04 DIAGNOSIS — Z01419 Encounter for gynecological examination (general) (routine) without abnormal findings: Secondary | ICD-10-CM

## 2019-04-04 DIAGNOSIS — N912 Amenorrhea, unspecified: Secondary | ICD-10-CM

## 2019-04-04 DIAGNOSIS — Z8632 Personal history of gestational diabetes: Secondary | ICD-10-CM

## 2019-04-04 DIAGNOSIS — F3181 Bipolar II disorder: Secondary | ICD-10-CM | POA: Diagnosis not present

## 2019-04-04 DIAGNOSIS — E669 Obesity, unspecified: Secondary | ICD-10-CM | POA: Diagnosis not present

## 2019-04-04 DIAGNOSIS — F411 Generalized anxiety disorder: Secondary | ICD-10-CM

## 2019-04-04 MED ORDER — FLUOXETINE HCL 20 MG PO CAPS: 20.0000 mg | ORAL_CAPSULE | Freq: Every day | ORAL | 3 refills | Status: DC

## 2019-04-04 MED ORDER — FLUOXETINE HCL 40 MG PO CAPS: 40.0000 mg | ORAL_CAPSULE | Freq: Every day | ORAL | 3 refills | Status: DC

## 2019-04-04 NOTE — Progress Notes (Signed)
GYNECOLOGY ANNUAL PHYSICAL EXAM PROGRESS NOTE  Subjective:    Andrea Roberson is a 33 y.o. [id number] female who presents for an annual exam.  The patient is sexually active. The patient wears seatbelts: yes. The patient participates in regular exercise: no. Has the patient ever been transfused or tattooed?: yes (professional). The patient reports that there is not domestic violence in her life.   The patient has the following complaints today:  1. She desires an increase of her Prozac for depression and anxiety (due to her Bipolar disorder.  Is currently on 40 mg daily (also on Buspar for anxiety).  She notes that she does not see her therapist for another month as the earliest appointment she could get but feels like her symptoms are completely controlled. Denies SI/HI.  GAD score is 19, PHQ-9 score is 16.    Gynecologic History  Menarche age: [age] No LMP recorded.  Lactational amenorrhea Contraception: tubal ligation History of STI's: Denies Last Pap: 03/23/2017. Results were: normal.  Denies h/o abnormal pap smears.     OB History  Gravida Para Term Preterm AB Living  2 2 2  0 0 2  SAB TAB Ectopic Multiple Live Births  0 0 0 0 2    # Outcome Date GA Lbr Len/2nd Weight Sex Delivery Anes PTL Lv  2 Term 11/19/18 6047w3d  7 lb 6.9 oz (3.37 kg) F CS-LTranv Spinal  LIV     Name: Saunders,GIRL Catilyn     Apgar1: 8  Apgar5: 8  1 Term 05/02/14 6586w1d  7 lb 8 oz (3.402 kg) M CS-LTranv Spinal, EPI N LIV     Complications: Gestational diabetes mellitus in childbirth, insulin controlled, Excessive weight gain in pregnancy     Name: [name]     Apgar1: 8  Apgar5: 9    Past Medical History:  Diagnosis Date  . Anxiety   . Asthma   . Bipolar disorder (HCC)   . Depression   . Frequent headaches   . GERD (gastroesophageal reflux disease)   . Gestational diabetes   . History of gestational diabetes 2016  . Hyperlipidemia   . Migraines   . Obesity (BMI 35.0-39.9 without comorbidity) 12/24/2015   . Tarsal coalition of left foot   . UTI (urinary tract infection)   . Wears contact lenses     Past Surgical History:  Procedure Laterality Date  . CESAREAN SECTION  2015  . CESAREAN SECTION WITH BILATERAL TUBAL LIGATION N/A 11/19/2018   Procedure: CESAREAN SECTION WITH BILATERAL TUBAL LIGATION;  Surgeon: Hildred Laserherry, Gorgeous Newlun, MD;  Location: [facility] ORS;  Service: Obstetrics;  Laterality: N/A;  TOB 0822 APGAR 8/8    . ESOPHAGOGASTRODUODENOSCOPY (EGD) WITH PROPOFOL N/A 08/30/2017   Procedure: ESOPHAGOGASTRODUODENOSCOPY (EGD) WITH PROPOFOL;  Surgeon: Toney ReilVanga, Rohini Reddy, MD;  [facility];  Service: Endoscopy;  Laterality: N/A;  . WISDOM TOOTH EXTRACTION      Family History  Problem Relation Age of Onset  . Heart disease Mother   . Diabetes Mother   . Anxiety disorder Mother   . Arthritis Mother   . Skin cancer Mother   . Heart disease Father   . Diabetes Father   . Skin cancer Father   . Arthritis Father   . Colon polyps Father   . Peptic Ulcer Disease Father   . Colon polyps Brother   . Colon cancer Paternal Aunt   . Heart disease Maternal Grandmother   . Colon cancer Paternal Grandfather  Social History   Socioeconomic History  . Marital status: Married    Spouse name: Not on file  . Number of children: Not on file  . Years of education: Not on file  . Highest education level: Not on file  Occupational History  . Not on file  Social Needs  . Financial resource strain: Not on file  . Food insecurity    Worry: Not on file    Inability: Not on file  . Transportation needs    Medical: Not on file    Non-medical: Not on file  Tobacco Use  . Smoking status: Former Smoker    Packs/day: 1.50    Years: 8.00    Pack years: 12.00    Types: Cigarettes    Quit date: 09/16/2012    Years since quitting: 6.5  . Smokeless tobacco: Never Used  Substance and Sexual Activity  . Alcohol use: Yes    Comment: 2 glasswine/month  . Drug use: No  . Sexual activity:  Yes    Birth control/protection: None  Lifestyle  . Physical activity    Days per week: Not on file    Minutes per session: Not on file  . Stress: Not on file  Relationships  . Social Herbalist on phone: Not on file    Gets together: Not on file    Attends religious service: Not on file    Active member of club or organization: Not on file    Attends meetings of clubs or organizations: Not on file    Relationship status: Not on file  . Intimate partner violence    Fear of current or ex partner: Not on file    Emotionally abused: Not on file    Physically abused: Not on file    Forced sexual activity: Not on file  Other Topics Concern  . Not on file  Social History Narrative   Married   1 boy   Pharmacist, hospital- [employer]       Current Outpatient Medications on File Prior to Visit  Medication Sig Dispense Refill  . busPIRone (BUSPAR) 5 MG tablet Take 1 tablet (5 mg total) by mouth 3 (three) times daily. 90 tablet 3  . cetirizine (ZYRTEC) 10 MG tablet Take 10 mg by mouth daily as needed for allergies.     Marland Kitchen FLUoxetine (PROZAC) 40 MG capsule Take 1 capsule (40 mg total) by mouth daily. 30 capsule 6  . fluticasone (FLONASE) 50 MCG/ACT nasal spray Place 2 sprays into both nostrils daily as needed for allergies.      No current facility-administered medications on file prior to visit.     No Known Allergies    Review of Systems Constitutional: negative for chills, fatigue, fevers and sweats Eyes: negative for irritation, redness and visual disturbance Ears, nose, mouth, throat, and face: negative for hearing loss, nasal congestion, snoring and tinnitus Respiratory: negative for asthma, cough, sputum Cardiovascular: negative for chest pain, dyspnea, exertional chest pressure/discomfort, irregular heart beat, palpitations and syncope Gastrointestinal: negative for abdominal pain, change in bowel habits, nausea and vomiting Genitourinary: negative for abnormal menstrual  periods, genital lesions, sexual problems and vaginal discharge, dysuria and urinary incontinence Integument/breast: negative for breast lump, breast tenderness and nipple discharge Hematologic/lymphatic: negative for bleeding and easy bruising Musculoskeletal:negative for back pain and muscle weakness Neurological: negative for dizziness, headaches, vertigo and weakness Endocrine: negative for diabetic symptoms including polydipsia, polyuria and skin dryness Allergic/Immunologic: negative for hay fever and urticaria  Psychological: positive for - anxiety and depression negative for - hallucinations, obsessive thoughts, sleep disturbances or suicidal ideation     Objective:  Blood pressure 119/87, pulse 78, height 5\' 5"  (1.651 m), weight 232 lb 11.2 oz (105.6 kg), currently breastfeeding. Body mass index is 38.72 kg/m.    General Appearance:    Alert, cooperative, no distress, appears stated age, moderate obesity  Head:    Normocephalic, without obvious abnormality, atraumatic  Eyes:    PERRL, conjunctiva/corneas clear, EOM's intact, both eyes  Ears:    Normal external ear canals, both ears  Nose:   Nares normal, septum midline, mucosa normal, no drainage or sinus tenderness  Throat:   Lips, mucosa, and tongue normal; teeth and gums normal  Neck:   Supple, symmetrical, trachea midline, no adenopathy; thyroid: no enlargement/tenderness/nodules; no carotid bruit or JVD  Back:     Symmetric, no curvature, ROM normal, no CVA tenderness  Lungs:     Clear to auscultation bilaterally, respirations unlabored  Chest Wall:    No tenderness or deformity   Heart:    Regular rate and rhythm, S1 and S2 normal, no murmur, rub or gallop  Breast Exam:    No tenderness, masses, or nipple abnormality  Abdomen:     Soft, non-tender, bowel sounds active all four quadrants, no masses, no organomegaly.    Genitalia:    Pelvic:external genitalia normal, vagina without lesions, discharge, or tenderness,  rectovaginal septum  normal. Cervix normal in appearance, no cervical motion tenderness, no adnexal masses or tenderness.  Uterus normal size, shape, mobile, regular contours, nontender.  Rectal:    Normal external sphincter.  No hemorrhoids appreciated. Internal exam not done.   Extremities:   Extremities normal, atraumatic, no cyanosis or edema  Pulses:   2+ and symmetric all extremities  Skin:   Skin color, texture, turgor normal, no rashes or lesions  Lymph nodes:   Cervical, supraclavicular, and axillary nodes normal  Neurologic:   CNII-XII intact, normal strength, sensation and reflexes throughout    Labs:  Lab Results  Component Value Date   WBC 10.1 11/20/2018   HGB 10.6 (L) 11/20/2018   HCT 33.9 (L) 11/20/2018   MCV 88.1 11/20/2018   PLT 247 11/20/2018    Lab Results  Component Value Date   CREATININE 0.47 11/16/2018   BUN 12 11/16/2018   NA 133 (L) 11/16/2018   K 3.6 11/16/2018   CL 106 11/16/2018   CO2 17 (L) 11/16/2018    Lab Results  Component Value Date   ALT 18 02/19/2018   AST 17 02/19/2018   ALKPHOS 68 02/19/2018   BILITOT 0.4 02/19/2018    Lab Results  Component Value Date   TSH 1.180 05/08/2018     Assessment:    Encounter for well woman exam with routine gynecological exam  Obesity (BMI 30-39.9)  History of gestational diabetes Bipolar 2 disorder (HCC)  Generalized anxiety disorder Lactational amenorrhea  Plan:     Blood tests: CBC with diff, Comprehensive metabolic panel, Lipoproteins, TSH and HgbA1c. Breast self exam technique reviewed and patient encouraged to perform self-exam monthly. Contraception: tubal ligation. Discussed healthy lifestyle modifications. Pap smear up to date. Next due in 2021.   Increased Prozac from 40 mg to 60 mg until patient can see her therapist. Advised that if symptoms worsen, or if SI/HI occurs, to notify MD immediately or report to Emergency Room. Also can contact Suicide Hotline.  RTC in1 year for  annual exam.  Rubie Maid, MD [facility]

## 2019-04-05 LAB — COMPREHENSIVE METABOLIC PANEL
ALT: 23 IU/L (ref 0–32)
AST: 18 IU/L (ref 0–40)
Albumin/Globulin Ratio: 1.4 (ref 1.2–2.2)
Albumin: 4.2 g/dL (ref 3.8–4.8)
Alkaline Phosphatase: 146 IU/L — ABNORMAL HIGH (ref 39–117)
BUN/Creatinine Ratio: 12 (ref 9–23)
BUN: 10 mg/dL (ref 6–20)
Bilirubin Total: 0.3 mg/dL (ref 0.0–1.2)
CO2: 21 mmol/L (ref 20–29)
Calcium: 9.8 mg/dL (ref 8.7–10.2)
Chloride: 100 mmol/L (ref 96–106)
Creatinine, Ser: 0.83 mg/dL (ref 0.57–1.00)
GFR calc Af Amer: 107 mL/min/{1.73_m2} (ref >59)
GFR calc non Af Amer: 93 mL/min/{1.73_m2} (ref >59)
Globulin, Total: 3.1 g/dL (ref 1.5–4.5)
Glucose: 87 mg/dL (ref 65–99)
Potassium: 4.5 mmol/L (ref 3.5–5.2)
Sodium: 138 mmol/L (ref 134–144)
Total Protein: 7.3 g/dL (ref 6.0–8.5)

## 2019-04-05 LAB — LIPID PANEL
Chol/HDL Ratio: 6 ratio — ABNORMAL HIGH (ref 0.0–4.4)
Cholesterol, Total: 238 mg/dL — ABNORMAL HIGH (ref 100–199)
HDL: 40 mg/dL (ref >39)
LDL Calculated: 126 mg/dL — ABNORMAL HIGH (ref 0–99)
Triglycerides: 360 mg/dL — ABNORMAL HIGH (ref 0–149)
VLDL Cholesterol Cal: 72 mg/dL — ABNORMAL HIGH (ref 5–40)

## 2019-04-05 LAB — CBC
Hematocrit: 38.4 % (ref 34.0–46.6)
Hemoglobin: 12.6 g/dL (ref 11.1–15.9)
MCH: 27.7 pg (ref 26.6–33.0)
MCHC: 32.8 g/dL (ref 31.5–35.7)
MCV: 84 fL (ref 79–97)
Platelets: 295 10*3/uL (ref 150–450)
RBC: 4.55 x10E6/uL (ref 3.77–5.28)
RDW: 14.6 % (ref 11.7–15.4)
WBC: 7.7 10*3/uL (ref 3.4–10.8)

## 2019-04-05 LAB — HEMOGLOBIN A1C
Est. average glucose Bld gHb Est-mCnc: 114 mg/dL
Hgb A1c MFr Bld: 5.6 % (ref 4.8–5.6)

## 2019-04-08 ENCOUNTER — Encounter: Payer: Self-pay | Admitting: Family

## 2019-04-22 ENCOUNTER — Ambulatory Visit (INDEPENDENT_AMBULATORY_CARE_PROVIDER_SITE_OTHER): Payer: BC Managed Care – PPO | Admitting: Licensed Clinical Social Worker

## 2019-04-22 ENCOUNTER — Other Ambulatory Visit: Payer: Self-pay

## 2019-04-22 ENCOUNTER — Ambulatory Visit: Payer: BC Managed Care – PPO | Admitting: Family

## 2019-04-22 DIAGNOSIS — F411 Generalized anxiety disorder: Secondary | ICD-10-CM

## 2019-04-23 ENCOUNTER — Ambulatory Visit: Payer: BC Managed Care – PPO | Admitting: Podiatry

## 2019-04-23 ENCOUNTER — Encounter: Payer: Self-pay | Admitting: Podiatry

## 2019-04-23 DIAGNOSIS — M25572 Pain in left ankle and joints of left foot: Secondary | ICD-10-CM

## 2019-04-23 DIAGNOSIS — Q6689 Other  specified congenital deformities of feet: Secondary | ICD-10-CM

## 2019-04-23 MED ORDER — MELOXICAM 15 MG PO TABS: 15.0000 mg | ORAL_TABLET | Freq: Every day | ORAL | 1 refills | Status: DC

## 2019-04-23 NOTE — Progress Notes (Signed)
   HPI: 33 year old female presenting today for follow up evaluation of tarsal coalition of the left foot.  Since last visit patient has had a baby which was born February 2020.  She presents today to discuss her treatment options regarding the toes tarsal coalition.  No changes noted at this time and no new complaints.  Past Medical History:  Diagnosis Date  . Anxiety   . Asthma   . Bipolar disorder ([place])   . Depression   . Frequent headaches   . GERD (gastroesophageal reflux disease)   . Gestational diabetes   . History of gestational diabetes 2016  . Hyperlipidemia   . Migraines   . Obesity (BMI 35.0-39.9 without comorbidity) 12/24/2015  . Tarsal coalition of left foot   . UTI (urinary tract infection)   . Wears contact lenses      Physical Exam: General: The patient is alert and oriented x3 in no acute distress.  Dermatology: Skin is warm, dry and supple bilateral lower extremities. Negative for open lesions or macerations.  Vascular: Palpable pedal pulses bilaterally. No edema or erythema noted. Capillary refill within normal limits.  Neurological: Epicritic and protective threshold grossly intact bilaterally.   Musculoskeletal Exam: Limited ROM with inversion and eversion of the subtalar joint. Muscle strength 5/5 in all groups bilateral.  There is some pain with inversion and eversion as well as pain on palpation of the sinus tarsi left  MRI Impression:  1. 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 This could be symptomatic. Consider further evaluation with CT. 2. The ankle tendons and ligaments appear normal. 3. No acute osseous findings.  Assessment: 1. Tarsal coalition left 2.  Sinus tarsitis left   Plan of Care:  1. Patient evaluated.  Today were going to do as MRI suggests and follow-up with CT scan.  CT scan ordered 2.  Injection of 0.5 cc Celestone Soluspan injected in sinus tarsi left foot 3.  Prescription for meloxicam 15 mg daily   4.  Patient is considering surgery towards the end of this year.  Recommend that she presents to the office a few months prior to when she would like to have surgery done to have a surgical consult or if she needs another anti-inflammatory injection.  She is trying to be more active and lose some weight post pregnancy however her foot has been inflamed and painful lately.  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  She also has a son approximately 47 years old     Edrick Kins, DPM [facility]  Dr. Edrick Kins, DPM    [address] [place], [place] [address]                Office [phone]  Fax [phone]

## 2019-04-24 ENCOUNTER — Ambulatory Visit (INDEPENDENT_AMBULATORY_CARE_PROVIDER_SITE_OTHER): Payer: BC Managed Care – PPO | Admitting: Family

## 2019-04-24 ENCOUNTER — Encounter: Payer: Self-pay | Admitting: Family

## 2019-04-24 ENCOUNTER — Telehealth: Payer: Self-pay

## 2019-04-24 ENCOUNTER — Other Ambulatory Visit: Payer: Self-pay

## 2019-04-24 DIAGNOSIS — E7800 Pure hypercholesterolemia, unspecified: Secondary | ICD-10-CM

## 2019-04-24 DIAGNOSIS — F419 Anxiety disorder, unspecified: Secondary | ICD-10-CM | POA: Diagnosis not present

## 2019-04-24 DIAGNOSIS — F329 Major depressive disorder, single episode, unspecified: Secondary | ICD-10-CM

## 2019-04-24 DIAGNOSIS — F32A Depression, unspecified: Secondary | ICD-10-CM

## 2019-04-24 DIAGNOSIS — Q6689 Other  specified congenital deformities of feet: Secondary | ICD-10-CM

## 2019-04-24 MED ORDER — OMEGA-3-ACID ETHYL ESTERS 1 G PO CAPS: 1.0000 g | ORAL_CAPSULE | Freq: Every day | ORAL | 11 refills | Status: DC

## 2019-04-24 NOTE — Progress Notes (Signed)
This visit type was conducted due to national recommendations for restrictions regarding the COVID-19 pandemic (e.g. social distancing).  This format is felt to be most appropriate for this patient at this time.  All issues noted in this document were discussed and addressed.  No physical exam was performed (except for noted visual exam findings with Video Visits). Virtual Visit via Video Note  I connected with@  on 04/24/19 at  8:30 AM EDT by a video enabled telemedicine application and verified that I am speaking with the correct person using two identifiers.  Location patient: home Location provider:work  Persons participating in the virtual visit: patient, provider  I discussed the limitations of evaluation and management by telemedicine and the availability of in person appointments. The patient expressed understanding and agreed to proceed.   HPI:  CC: Concern regarding cholesterol. ( Done by Dr Victorino Decemberherry OB). Working on exercise over past 2 weeks, 2-3 times per week walking. Breastfeeding.  Mother has heart disease  anxietyand depression- recent increase of prozac 2 weeks ago from Dr Valentino Saxonherry Some improved. Trouble sleeping. Increased anxiety. Doing counseling.  No si/hi. Not taking buspar TID. Using more prn.  New mom. Loss of father this year. Follows with psychiatry, Dr Elna BreslowEappen.   ROS: See pertinent positives and negatives per HPI.  Past Medical History:  Diagnosis Date  . Anxiety   . Asthma   . Bipolar disorder (HCC)   . Depression   . Frequent headaches   . GERD (gastroesophageal reflux disease)   . Gestational diabetes   . History of gestational diabetes 2016  . Hyperlipidemia   . Migraines   . Obesity (BMI 35.0-39.9 without comorbidity) 12/24/2015  . Tarsal coalition of left foot   . UTI (urinary tract infection)   . Wears contact lenses     Past Surgical History:  Procedure Laterality Date  . CESAREAN SECTION  2015  . CESAREAN SECTION WITH BILATERAL TUBAL LIGATION  N/A 11/19/2018   Procedure: CESAREAN SECTION WITH BILATERAL TUBAL LIGATION;  Surgeon: Hildred Laserherry, Anika, MD;  Location: [facility] ORS;  Service: Obstetrics;  Laterality: N/A;  TOB 0822 APGAR 8/8    . ESOPHAGOGASTRODUODENOSCOPY (EGD) WITH PROPOFOL N/A 08/30/2017   Procedure: ESOPHAGOGASTRODUODENOSCOPY (EGD) WITH PROPOFOL;  Surgeon: Toney ReilVanga, Rohini Reddy, MD;  [facility];  Service: Endoscopy;  Laterality: N/A;  . WISDOM TOOTH EXTRACTION      Family History  Problem Relation Age of Onset  . Heart disease Mother   . Diabetes Mother   . Anxiety disorder Mother   . Arthritis Mother   . Skin cancer Mother   . Diabetes Father   . Skin cancer Father   . Arthritis Father   . Colon polyps Father   . Peptic Ulcer Disease Father   . Colon polyps Brother   . Colon cancer Paternal Aunt   . Heart disease Maternal Grandmother   . Colon cancer Paternal Grandfather     SOCIAL HX: former smoker   Current Outpatient Medications:  .  busPIRone (BUSPAR) 5 MG tablet, Take 1 tablet (5 mg total) by mouth 3 (three) times daily., Disp: 90 tablet, Rfl: 3 .  cetirizine (ZYRTEC) 10 MG tablet, Take 10 mg by mouth daily as needed for allergies. , Disp: , Rfl:  .  FLUoxetine (PROZAC) 20 MG capsule, Take 1 capsule (20 mg total) by mouth daily., Disp: 30 capsule, Rfl: 3 .  FLUoxetine (PROZAC) 40 MG capsule, Take 1 capsule (40 mg total) by mouth daily., Disp: 30 capsule, Rfl:  3 .  fluticasone (FLONASE) 50 MCG/ACT nasal spray, Place 2 sprays into both nostrils daily as needed for allergies. , Disp: , Rfl:  .  meloxicam (MOBIC) 15 MG tablet, Take 1 tablet (15 mg total) by mouth daily., Disp: 30 tablet, Rfl: 1 .  omega-3 acid ethyl esters (LOVAZA) 1 g capsule, Take 1 capsule (1 g total) by mouth daily., Disp: 120 capsule, Rfl: 11  EXAM:  VITALS per patient if applicable: BP Readings from Last 3 Encounters:  04/04/19 119/87  01/10/19 116/87  12/06/18 125/80   Wt Readings from Last 3 Encounters:   04/04/19 232 lb 11.2 oz (105.6 kg)  01/10/19 230 lb 11.2 oz (104.6 kg)  12/06/18 236 lb 6.4 oz (107.2 kg)     GENERAL: alert, oriented, appears well and in no acute distress  HEENT: atraumatic, conjunttiva clear, no obvious abnormalities on inspection of external nose and ears  NECK: normal movements of the head and neck  LUNGS: on inspection no signs of respiratory distress, breathing rate appears normal, no obvious gross SOB, gasping or wheezing  CV: no obvious cyanosis  MS: moves all visible extremities without noticeable abnormality  PSYCH/NEURO: pleasant and cooperative, no obvious depression or anxiety, speech and thought processing grossly intact  ASSESSMENT AND PLAN:  Discussed the following assessment and plan: Problem List Items Addressed This Visit      Other   Anxiety and depression    Depression screen [facility] 2/9 04/24/2019 04/04/2019 09/21/2018  Decreased Interest 3 2 1   Down, Depressed, Hopeless 2 2 1   PHQ - 2 Score 5 4 2   Altered sleeping 3 3 0  Tired, decreased energy 3 3 2   Change in appetite 2 1 0  Feeling bad or failure about yourself  1 2 1   Trouble concentrating 2 1 0  Moving slowly or fidgety/restless 2 2 0  Suicidal thoughts 0 0 0  PHQ-9 Score 18 16 5   Difficult doing work/chores Very difficult Very difficult Not difficult at all   Worsened of late. Has had a lot of life transitions. Advised to take buspar TID versus PRN. Continue exercise. She will follow with Eappen ; establishing with her 05/15/19.  Will follow.       Elevated cholesterol    Low CV risk ( 1.0 %). Family history heart disease. Recent pregnancy and 30+ weight gain. She will start lovaza and continue lifestyle changes in pursuit of getting closer to pre-pregnancy weight. Repeat labs in 6 months.      Relevant Medications   omega-3 acid ethyl esters (LOVAZA) 1 g capsule        I discussed the assessment and treatment plan with the patient. The patient was provided an opportunity  to ask questions and all were answered. The patient agreed with the plan and demonstrated an understanding of the instructions.   The patient was advised to call back or seek an in-person evaluation if the symptoms worsen or if the condition fails to improve as anticipated.   Mable Paris, FNP

## 2019-04-24 NOTE — Assessment & Plan Note (Addendum)
Depression screen [facility] 2/9 04/24/2019 04/04/2019 09/21/2018  Decreased Interest 3 2 1   Down, Depressed, Hopeless 2 2 1   PHQ - 2 Score 5 4 2   Altered sleeping 3 3 0  Tired, decreased energy 3 3 2   Change in appetite 2 1 0  Feeling bad or failure about yourself  1 2 1   Trouble concentrating 2 1 0  Moving slowly or fidgety/restless 2 2 0  Suicidal thoughts 0 0 0  PHQ-9 Score 18 16 5   Difficult doing work/chores Very difficult Very difficult Not difficult at all   Worsened of late. Has had a lot of life transitions. Advised to take buspar TID versus PRN. Continue exercise. She will follow with Eappen ; establishing with her 05/15/19.  Will follow.

## 2019-04-24 NOTE — Patient Instructions (Addendum)
Take buspar three times per day to help sleep. Please also discuss with Dr Shea Evans  Start lovaza which has omega 3. This is safe to take in breastfeeding.  Continue walking regimen and aim for half pound a week of weight loss. Give yourself grace with this :)   Call the office in 6 months and ask for fasting cholesterol labs and we can take a look  Stay safe!

## 2019-04-24 NOTE — Telephone Encounter (Signed)
CT has been approved from 04/24/2019 to 10/20/2019  Auth # [id number] Patient has been notified via voice mail and instructed to call scheduling to set up own appt to her convenience.

## 2019-04-24 NOTE — Assessment & Plan Note (Addendum)
Low CV risk ( 1.0 %). Family history heart disease. Recent pregnancy and 30+ weight gain. She will start lovaza and continue lifestyle changes in pursuit of getting closer to pre-pregnancy weight. Repeat labs in 6 months.

## 2019-04-24 NOTE — Telephone Encounter (Signed)
-----   Message from Edrick Kins, DPM sent at 04/23/2019  3:07 PM EDT ----- Regarding: Tarsal coalition left Please order CT left foot.   Dx: tarsal coalition left. Compare to previous MRI.   Thanks, Dr. Amalia Hailey

## 2019-05-07 ENCOUNTER — Ambulatory Visit
Admission: RE | Admit: 2019-05-07 | Discharge: 2019-05-07 | Disposition: A | Discharge status: Home / Self Care | Payer: BC Managed Care – PPO | Source: Ambulatory Visit | Attending: Podiatry | Admitting: Podiatry

## 2019-05-07 ENCOUNTER — Other Ambulatory Visit: Payer: Self-pay

## 2019-05-07 DIAGNOSIS — Q6689 Other  specified congenital deformities of feet: Secondary | ICD-10-CM | POA: Diagnosis present

## 2019-05-13 ENCOUNTER — Telehealth: Payer: Self-pay

## 2019-05-13 DIAGNOSIS — F329 Major depressive disorder, single episode, unspecified: Secondary | ICD-10-CM

## 2019-05-13 DIAGNOSIS — F419 Anxiety disorder, unspecified: Secondary | ICD-10-CM

## 2019-05-13 NOTE — Telephone Encounter (Signed)
We received denial from [employer] [employer] that Lovaza was not covered. Patient is not ona stain due to age I am assuming. Please advise?

## 2019-05-15 ENCOUNTER — Encounter

## 2019-05-15 ENCOUNTER — Ambulatory Visit (INDEPENDENT_AMBULATORY_CARE_PROVIDER_SITE_OTHER): Payer: BC Managed Care – PPO | Admitting: Psychiatry

## 2019-05-15 ENCOUNTER — Other Ambulatory Visit: Payer: Self-pay

## 2019-05-15 ENCOUNTER — Encounter: Payer: Self-pay | Admitting: Family

## 2019-05-15 ENCOUNTER — Encounter: Payer: Self-pay | Admitting: Psychiatry

## 2019-05-15 DIAGNOSIS — G4701 Insomnia due to medical condition: Secondary | ICD-10-CM

## 2019-05-15 DIAGNOSIS — Z634 Disappearance and death of family member: Secondary | ICD-10-CM

## 2019-05-15 DIAGNOSIS — F411 Generalized anxiety disorder: Secondary | ICD-10-CM

## 2019-05-15 MED ORDER — FLUOXETINE HCL 40 MG PO CAPS: 80.0000 mg | ORAL_CAPSULE | Freq: Every day | ORAL | 0 refills | Status: DC

## 2019-05-15 MED ORDER — HYDROXYZINE PAMOATE 25 MG PO CAPS: 25.0000 mg | ORAL_CAPSULE | Freq: Every day | ORAL | 0 refills | Status: DC | PRN

## 2019-05-15 NOTE — Progress Notes (Signed)
Virtual Visit via Video Note  I connected with Andrea Roberson on 05/15/19 at  1:00 PM EDT by a video enabled telemedicine application and verified that I am speaking with the correct person using two identifiers.   I discussed the limitations of evaluation and management by telemedicine and the availability of in person appointments. The patient expressed understanding and agreed to proceed.     I discussed the assessment and treatment plan with the patient. The patient was provided an opportunity to ask questions and all were answered. The patient agreed with the plan and demonstrated an understanding of the instructions.   The patient was advised to call back or seek an in-person evaluation if the symptoms worsen or if the condition fails to improve as anticipated.    Psychiatric Initial Adult Assessment   Patient Identification: Andrea Roberson MRN:  [id number] Date of Evaluation:  05/15/2019 Referral Source: Ms.Andrea Roberson Chief Complaint:   Chief Complaint    Anxiety; Establish Care     Visit Diagnosis:    ICD-10-CM   1. Generalized anxiety disorder  F41.1 FLUoxetine (PROZAC) 40 MG capsule  2. Bereavement  Z63.4   3. Insomnia due to medical condition  G47.01 hydrOXYzine (VISTARIL) 25 MG capsule    History of Present Illness:  Andrea Roberson is a 33 year old female, married, lives in [place], has a history of generalized anxiety disorder, hyperlipidemia, insomnia was evaluated by telemedicine today.  Patient reports she has been struggling with anxiety disorder since the past several years.  Patient was under the care of Dr. Einar Roberson however she reports that she stopped coming here since she was feeling better.  Her medications thereafter was being managed by her primary care provider.  Patient reports recently she lost her father.  Her father was diagnosed with pancreatic mass and passed away within 2 weeks.  This was in January 2020.  A week after that she delivered her second  baby who is currently 83 months old.  Patient reports because of all the stressors she became more and more anxious and also struggled with some depressive symptoms.  Patient reports she worries about everything all the time, is anxious and restless.  She also reports having crying spells sadness sleep issues.  Reports panic attacks when she has racing heart rate, chest pain, shortness of breath and has crying spells.  She reports her husband tries to talk her down and she works on her breathing techniques.  Patient reports she is currently taking Prozac which she has been on for several years.  She reports she likes the effect of Prozac and that is the only medication which may have worked for her.  She also reports she was recently on BuSpar but she does not think it is effective.  She remembers being tried on trazodone however that gave her nightmares for sleep.  She has tried hydroxyzine as needed in the past which may have helped.  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  She has good support system from her husband.  She is currently in psychotherapy session with Ms. Andrea Roberson which is going well.  Associated Signs/Symptoms: Depression Symptoms:  depressed mood, insomnia, difficulty concentrating, anxiety, (Hypo) Manic Symptoms:  denies Anxiety Symptoms:  Excessive Worry, Panic Symptoms, Psychotic Symptoms:  denies PTSD Symptoms: Negative  Past Psychiatric History: Patient denies any inpatient mental health admissions.  Patient denies suicide attempts.  Patient was under the care of Dr. Einar Roberson here in clinic.  Patient currently sees Ms. Andrea Ricks  Roberson for psychotherapy sessions.  Previous Psychotropic Medications: Yes prozac, trazodone,buspar,zoloft  Substance Abuse History in the last 12 months:  No.  Consequences of Substance Abuse: Negative  Past Medical History:  Past Medical History:  Diagnosis Date  . Anxiety   . Asthma   . Bipolar disorder (HCC)   . Depression    . Frequent headaches   . GERD (gastroesophageal reflux disease)   . Gestational diabetes   . History of gestational diabetes 2016  . Hyperlipidemia   . Migraines   . Obesity (BMI 35.0-39.9 without comorbidity) 12/24/2015  . Tarsal coalition of left foot   . UTI (urinary tract infection)   . Wears contact lenses     Past Surgical History:  Procedure Laterality Date  . CESAREAN SECTION  2015  . CESAREAN SECTION WITH BILATERAL TUBAL LIGATION N/A 11/19/2018   Procedure: CESAREAN SECTION WITH BILATERAL TUBAL LIGATION;  Surgeon: Andrea Roberson, Anika, MD;  Location: [facility];  Service: Obstetrics;  Laterality: N/A;  TOB 0822 APGAR 8/8    . ESOPHAGOGASTRODUODENOSCOPY (EGD) WITH PROPOFOL N/A 08/30/2017   Procedure: ESOPHAGOGASTRODUODENOSCOPY (EGD) WITH PROPOFOL;  Surgeon: Andrea Roberson, Andrea Reddy, MD;  [facility];  Service: Endoscopy;  Laterality: N/A;  . WISDOM TOOTH EXTRACTION      Family Psychiatric History: Mother-mental health problems.  Family History:  Family History  Problem Relation Age of Onset  . Heart disease Mother   . Diabetes Mother   . Anxiety disorder Mother   . Arthritis Mother   . Skin cancer Mother   . Diabetes Father   . Skin cancer Father   . Arthritis Father   . Colon polyps Father   . Peptic Ulcer Disease Father   . Colon polyps Brother   . Colon cancer Paternal Aunt   . Heart disease Maternal Grandmother   . Colon cancer Paternal Grandfather     Social History:   Social History   Socioeconomic History  . Marital status: Married    Spouse name: Not on file  . Number of children: Not on file  . Years of education: Not on file  . Highest education level: Not on file  Occupational History  . Not on file  Social Needs  . Financial resource strain: Not on file  . Food insecurity    Worry: Not on file    Inability: Not on file  . Transportation needs    Medical: Not on file    Non-medical: Not on file  Tobacco Use  . Smoking status:  Former Smoker    Packs/day: 1.50    Years: 8.00    Pack years: 12.00    Types: Cigarettes    Quit date: 09/16/2012    Years since quitting: 6.6  . Smokeless tobacco: Never Used  Substance and Sexual Activity  . Alcohol use: Yes    Comment: 2 glasswine/month  . Drug use: No  . Sexual activity: Yes    Birth control/protection: None  Lifestyle  . Physical activity    Days per week: Not on file    Minutes per session: Not on file  . Stress: Not on file  Relationships  . Social Musicianconnections    Talks on phone: Not on file    Gets together: Not on file    Attends religious service: Not on file    Active member of club or organization: Not on file    Attends meetings of clubs or organizations: Not on file    Relationship status: Not on  file  Other Topics Concern  . Not on file  Social History Narrative   Married   5 yo son , 5 month daughter   Runner, broadcasting/film/videoTeacher- pre K       Additional Social History: Patient is married.  She has been married since the past 6 years.  She has a son and a daughter.  Patient denies any history of trauma.  She works as a Emergency planning/management officerpre-k teacher. Allergies:  No Known Allergies  Metabolic Disorder Labs: Lab Results  Component Value Date   HGBA1C 5.6 04/04/2019   No results found for: PROLACTIN Lab Results  Component Value Date   CHOL 238 (H) 04/04/2019   TRIG 360 (H) 04/04/2019   HDL 40 04/04/2019   CHOLHDL 6.0 (H) 04/04/2019   VLDL 34.8 02/19/2018   LDLCALC 126 (H) 04/04/2019   LDLCALC 113 (H) 02/19/2018   Lab Results  Component Value Date   TSH 1.180 05/08/2018    Therapeutic Level Labs: No results found for: LITHIUM No results found for: CBMZ No results found for: VALPROATE  Current Medications: Current Outpatient Medications  Medication Sig Dispense Refill  . cetirizine (ZYRTEC) 10 MG tablet Take 10 mg by mouth daily as needed for allergies.     Marland Kitchen. FLUoxetine (PROZAC) 40 MG capsule Take 2 capsules (80 mg total) by mouth daily. 180 capsule 0  .  fluticasone (FLONASE) 50 MCG/ACT nasal spray Place 2 sprays into both nostrils daily as needed for allergies.     . hydrOXYzine (VISTARIL) 25 MG capsule Take 1 capsule (25 mg total) by mouth daily as needed. For sleep 90 capsule 0  . meloxicam (MOBIC) 15 MG tablet Take 1 tablet (15 mg total) by mouth daily. 30 tablet 1  . omega-3 acid ethyl esters (LOVAZA) 1 g capsule Take 1 capsule (1 g total) by mouth daily. 120 capsule 11   No current facility-administered medications for this visit.     Musculoskeletal: Strength & Muscle Tone: denies tremors, rigidity Gait & Station: normal Patient leans: N/A  Psychiatric Specialty Exam: Review of Systems  Psychiatric/Behavioral: Positive for depression. The patient is nervous/anxious and has insomnia.   All other systems reviewed and are negative.   currently breastfeeding.There is no height or weight on file to calculate BMI.  General Appearance: Casual  Eye Contact:  Fair  Speech:  Clear and Coherent  Volume:  Normal  Mood:  Anxious and Depressed  Affect:  Appropriate  Thought Process:  Goal Directed and Descriptions of Associations: Intact  Orientation:  Full (Time, Place, and Person)  Thought Content:  Logical  Suicidal Thoughts:  No  Homicidal Thoughts:  No  Memory:  Immediate;   Fair Recent;   Fair Remote;   Fair  Judgement:  Fair  Insight:  Fair  Psychomotor Activity:  Normal  Concentration:  Concentration: Fair and Attention Span: Fair  Recall:  FiservFair  Fund of Knowledge:Fair  Language: Fair  Akathisia:  No  Handed:  Right  AIMS (if indicated):  Denies tremors,rigidity,stiffness  Assets:  Communication Skills Desire for Improvement Financial Resources/Insurance Housing Intimacy Social Support  ADL's:  Intact  Cognition: WNL  Sleep:  Fair   Screenings: GAD-7     Office Visit from 04/04/2019 in [facility]  Total GAD-7 Score  19    PHQ2-9     [facility] [place]  Office Visit from 04/04/2019 in [facility] [place] Nutrition from 05/04/2018 in  [facility] [place] Office Visit [facility]/2018 in [facility] [place]  PHQ-2 Total Score  5  4  2   0  3  PHQ-9 Total Score  18  16  5   -  13      Assessment and Plan: [name] is a 33 year old female who has a history of GAD, insomnia, hyperlipidemia was evaluated by telemedicine today.  Patient is biologically predisposed given her family history.  She has psychosocial stressors of recent death of her father, having a newborn.  Patient currently denies suicidality and has good social support system.  She denies any substance abuse problems.  Patient is motivated to stay on medications as well as continue psychotherapy sessions.  Plan For GAD- unstable Increase Prozac to 80 mg p.o. daily.  Patient wants to stay on Prozac Taper of BuSpar since she reports it is not beneficial. Add hydroxyzine 25 mg p.o. nightly as needed for anxiety as well as sleep.  For bereavement-unstable She will continue to work with her therapist Ms. Roberson.  For insomnia-unstable Discussed sleep hygiene techniques. Discussed body scan meditations and provided resources. Discussed to limit the use of hydroxyzine since she is breast-feeding.  Discussed breast feeding implications of medications like Prozac, BuSpar and hydroxyzine.  Discussed with patient to get TSH done.  She will get it from her primary care provider.  Have reviewed medical records in [facility] per Dr. Dorthula Rueavi-most recent one dated 04/04/2017- patient with GAD, MDD-continue Prozac, trazodone and continue therapy with Ms. Roberson.'  Follow-up in clinic in 4 weeks or sooner if needed.  September 2 at 2:15 PM  I have spent atleast 40 minutes non face to face with patient today. More than 50 % of the time was spent for psychoeducation and supportive psychotherapy and care coordination.  This  note was generated in part or whole with voice recognition software. Voice recognition is usually quite accurate but there are transcription errors that can and very often do occur. I apologize for any typographical errors that were not detected and corrected.         Jomarie LongsSaramma Kingstyn Deruiter, MD 7/29/20201:46 PM

## 2019-05-17 NOTE — Addendum Note (Signed)
Addended by: Burnard Hawthorne on: 05/17/2019 03:45 PM   Modules accepted: Orders

## 2019-05-17 NOTE — Telephone Encounter (Signed)
Call pt Inform her lovaza not covered. She may take OTC omega 3 supplement  Unfortunately there is not good guidelines to support starting statin therapy in her 30's.   We advise lifestyle and following of course.   With her family h/o , we can absolutely consider a cardiac consult for risk stratification and further discussion of how to lower risks through her lifespan  Would she like a referral?  At the very least, we need to repeat cholesterol in 6 months to follow trend. Please ask her to call for f/u appt and fasting lab at that time.

## 2019-05-17 NOTE — Addendum Note (Signed)
Addended by: Burnard Hawthorne on: 05/17/2019 01:37 PM   Modules accepted: Orders

## 2019-05-17 NOTE — Telephone Encounter (Signed)
Call pt Noted No problem. Tsh ordered.  Please schedule

## 2019-05-20 NOTE — Telephone Encounter (Signed)
TSH scheduled.

## 2019-05-21 ENCOUNTER — Other Ambulatory Visit: Payer: BC Managed Care – PPO

## 2019-05-24 ENCOUNTER — Ambulatory Visit: Payer: BC Managed Care – PPO | Admitting: Licensed Clinical Social Worker

## 2019-05-29 ENCOUNTER — Other Ambulatory Visit: Payer: Self-pay

## 2019-05-29 ENCOUNTER — Other Ambulatory Visit (INDEPENDENT_AMBULATORY_CARE_PROVIDER_SITE_OTHER): Payer: BC Managed Care – PPO

## 2019-05-29 DIAGNOSIS — F419 Anxiety disorder, unspecified: Secondary | ICD-10-CM

## 2019-05-29 DIAGNOSIS — F329 Major depressive disorder, single episode, unspecified: Secondary | ICD-10-CM | POA: Diagnosis not present

## 2019-05-29 LAB — TSH: TSH: 0.9 u[IU]/mL (ref 0.35–4.50)

## 2019-06-07 ENCOUNTER — Ambulatory Visit (INDEPENDENT_AMBULATORY_CARE_PROVIDER_SITE_OTHER): Payer: BC Managed Care – PPO | Admitting: Licensed Clinical Social Worker

## 2019-06-07 ENCOUNTER — Other Ambulatory Visit: Payer: Self-pay

## 2019-06-07 DIAGNOSIS — Z634 Disappearance and death of family member: Secondary | ICD-10-CM

## 2019-06-07 DIAGNOSIS — F411 Generalized anxiety disorder: Secondary | ICD-10-CM | POA: Diagnosis not present

## 2019-06-16 NOTE — Progress Notes (Signed)
Virtual Visit via Telephone Note  I connected with Elon Jester on 06/07/19 at  8:00 AM EDT by telephone and verified that I am speaking with the correct person using two identifiers.  Location: Patient: work Provider: office   I discussed the limitations, risks, security and privacy concerns of performing an evaluation and management service by telephone and the availability of in person appointments. I also discussed with the patient that there may be a patient responsible charge related to this service. The patient expressed understanding and agreed to proceed.     I discussed the assessment and treatment plan with the patient. The patient was provided an opportunity to ask questions and all were answered. The patient agreed with the plan and demonstrated an understanding of the instructions.   The patient was advised to call back or seek an in-person evaluation if the symptoms worsen or if the condition fails to improve as anticipated.  I provided 28 minutes of non-face-to-face time during this encounter.   Lubertha South, LCSW   THERAPIST PROGRESS NOTE  Session Time: 67min  Participation Level: Active  Type of Therapy: Individual Therapy  Treatment Goals addressed: Anxiety  Interventions: Supportive  Summary: JACOLE CAPLEY is a 33 y.o. female who presents with continued symptoms of her diagnosis.  Patient was at work.  LCSW offered education about common irrational fears and beliefs that contribute to anxiety. Reviewed and showed client how to use CBT/REBT strategies to recognize and re-frame irrational fears and self-talk as a means of increasing client's capacity to handle their anxiety more constructively.    Suicidal/Homicidal: No  Plan: Return again in 1 weeks.  Diagnosis: Axis I: Anxiety Disorder NOS    Axis II: No diagnosis    Lubertha South, LCSW 06/07/2019

## 2019-06-19 ENCOUNTER — Other Ambulatory Visit: Payer: Self-pay

## 2019-06-19 ENCOUNTER — Encounter: Payer: Self-pay | Admitting: Psychiatry

## 2019-06-19 ENCOUNTER — Ambulatory Visit (INDEPENDENT_AMBULATORY_CARE_PROVIDER_SITE_OTHER): Payer: BC Managed Care – PPO | Admitting: Psychiatry

## 2019-06-19 DIAGNOSIS — Z634 Disappearance and death of family member: Secondary | ICD-10-CM | POA: Diagnosis not present

## 2019-06-19 DIAGNOSIS — G4701 Insomnia due to medical condition: Secondary | ICD-10-CM | POA: Diagnosis not present

## 2019-06-19 DIAGNOSIS — F330 Major depressive disorder, recurrent, mild: Secondary | ICD-10-CM | POA: Diagnosis not present

## 2019-06-19 DIAGNOSIS — F411 Generalized anxiety disorder: Secondary | ICD-10-CM

## 2019-06-19 MED ORDER — ARIPIPRAZOLE 2 MG PO TABS: 2.0000 mg | ORAL_TABLET | Freq: Every day | ORAL | 1 refills | Status: DC

## 2019-06-19 NOTE — Progress Notes (Signed)
Virtual Visit via Video Note  I connected with Andrea Roberson on 06/19/19 at  2:15 PM EDT by a video enabled telemedicine application and verified that I am speaking with the correct person using two identifiers.   I discussed the limitations of evaluation and management by telemedicine and the availability of in person appointments. The patient expressed understanding and agreed to proceed.   I discussed the assessment and treatment plan with the patient. The patient was provided an opportunity to ask questions and all were answered. The patient agreed with the plan and demonstrated an understanding of the instructions.   The patient was advised to call back or seek an in-person evaluation if the symptoms worsen or if the condition fails to improve as anticipated.  Provider Location : [facility] Patient Location : Home  [place] MD OP Progress Note  06/19/2019 5:42 PM [name]  MRN:  [id number]  Chief Complaint:  Chief Complaint    Follow-up     HPI: Andrea Roberson is a 33 year old female, married, lives in [place] , has a history of generalized anxiety disorder, bereavement, hyperlipidemia, insomnia was evaluated by telemedicine today.  Patient today reports the Prozac increased dosage is helping to some extent with her anxiety symptoms.  She however reports she has been feeling more irritable and having some mood lability recently.  She reports she feels sad often.  She does report she has been eating a lot to cope with her mood often.  She denies any purging or use of laxatives or other compensatory behaviors.  Patient reports sleep is okay.  She reports she is back at work.  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  She reports she has support system at home with taking care of her baby.  Her husband as well as mother and mother-in-law helps out.  Patient denies any suicidality, homicidality or  perceptual disturbances.  Patient reports she has been following up with her therapist.  Encouraged her to see her therapist more frequently.   Visit Diagnosis:    ICD-10-CM   1. Generalized anxiety disorder  F41.1 ARIPiprazole (ABILIFY) 2 MG tablet  2. MDD (major depressive disorder), recurrent episode, mild ([place])  F33.0   3. Bereavement  Z63.4 ARIPiprazole (ABILIFY) 2 MG tablet  4. Insomnia due to medical condition  G47.01     Past Psychiatric History: I have reviewed past psychiatric history from my progress note on 05/15/2019.  Past trials of Prozac, trazodone, BuSpar, Zoloft.  Past Medical History:  Past Medical History:  Diagnosis Date  . Anxiety   . Asthma   . Bipolar disorder ([place])   . Depression   . Frequent headaches   . GERD (gastroesophageal reflux disease)   . Gestational diabetes   . History of gestational diabetes 2016  . Hyperlipidemia   . Migraines   . Obesity (BMI 35.0-39.9 without comorbidity) 12/24/2015  . Tarsal coalition of left foot   . UTI (urinary tract infection)   . Wears contact lenses     Past Surgical History:  Procedure Laterality Date  . CESAREAN SECTION  2015  . CESAREAN SECTION WITH BILATERAL TUBAL LIGATION N/A 11/19/2018   Procedure: CESAREAN SECTION WITH BILATERAL TUBAL LIGATION;  Surgeon: Rubie Maid, MD;  Location: [facility];  Service: Obstetrics;  Laterality: N/A;  TOB 0822 APGAR 8/8    . ESOPHAGOGASTRODUODENOSCOPY (EGD) WITH PROPOFOL N/A 08/30/2017  Procedure: ESOPHAGOGASTRODUODENOSCOPY (EGD) WITH PROPOFOL;  Surgeon: Toney ReilVanga, Rohini Reddy, MD;  [facility];  Service: Endoscopy;  Laterality: N/A;  . WISDOM TOOTH EXTRACTION      Family Psychiatric History: I have reviewed family psychiatric history from my progress note on 05/15/2019.  Family History:  Family History  Problem Relation Age of Onset  . Heart disease Mother   . Diabetes Mother   . Anxiety disorder Mother   . Arthritis Mother   . Skin cancer Mother    . Diabetes Father   . Skin cancer Father   . Arthritis Father   . Colon polyps Father   . Peptic Ulcer Disease Father   . Colon polyps Brother   . Colon cancer Paternal Aunt   . Heart disease Maternal Grandmother   . Colon cancer Paternal Grandfather     Social History: I have reviewed social history from my progress note on 05/15/2019. Social History   Socioeconomic History  . Marital status: Married    Spouse name: Not on file  . Number of children: Not on file  . Years of education: Not on file  . Highest education level: Not on file  Occupational History  . Not on file  Social Needs  . Financial resource strain: Not on file  . Food insecurity    Worry: Not on file    Inability: Not on file  . Transportation needs    Medical: Not on file    Non-medical: Not on file  Tobacco Use  . Smoking status: Former Smoker    Packs/day: 1.50    Years: 8.00    Pack years: 12.00    Types: Cigarettes    Quit date: 09/16/2012    Years since quitting: 6.7  . Smokeless tobacco: Never Used  Substance and Sexual Activity  . Alcohol use: Yes    Comment: 2 glasswine/month  . Drug use: No  . Sexual activity: Yes    Birth control/protection: None  Lifestyle  . Physical activity    Days per week: Not on file    Minutes per session: Not on file  . Stress: Not on file  Relationships  . Social Musicianconnections    Talks on phone: Not on file    Gets together: Not on file    Attends religious service: Not on file    Active member of club or organization: Not on file    Attends meetings of clubs or organizations: Not on file    Relationship status: Not on file  Other Topics Concern  . Not on file  Social History Narrative   Married   5 yo son , 5 month daughter   Runner, broadcasting/film/videoTeacher- pre K       Allergies: No Known Allergies  Metabolic Disorder Labs: Lab Results  Component Value Date   HGBA1C 5.6 04/04/2019   No results found for: PROLACTIN Lab Results  Component Value Date   CHOL 238  (H) 04/04/2019   TRIG 360 (H) 04/04/2019   HDL 40 04/04/2019   CHOLHDL 6.0 (H) 04/04/2019   VLDL 34.8 02/19/2018   LDLCALC 126 (H) 04/04/2019   LDLCALC 113 (H) 02/19/2018   Lab Results  Component Value Date   TSH 0.90 05/29/2019   TSH 1.180 05/08/2018    Therapeutic Level Labs: No results found for: LITHIUM No results found for: VALPROATE No components found for:  CBMZ  Current Medications: Current Outpatient Medications  Medication Sig Dispense Refill  . ARIPiprazole (ABILIFY) 2 MG tablet  Take 1 tablet (2 mg total) by mouth daily. 30 tablet 1  . cetirizine (ZYRTEC) 10 MG tablet Take 10 mg by mouth daily as needed for allergies.     Marland Kitchen FLUoxetine (PROZAC) 40 MG capsule Take 2 capsules (80 mg total) by mouth daily. 180 capsule 0  . fluticasone (FLONASE) 50 MCG/ACT nasal spray Place 2 sprays into both nostrils daily as needed for allergies.     . hydrOXYzine (VISTARIL) 25 MG capsule Take 1 capsule (25 mg total) by mouth daily as needed. For sleep 90 capsule 0  . meloxicam (MOBIC) 15 MG tablet Take 1 tablet (15 mg total) by mouth daily. 30 tablet 1   No current facility-administered medications for this visit.      Musculoskeletal: Strength & Muscle Tone: UTA Gait & Station: normal Patient leans: N/A  Psychiatric Specialty Exam: Review of Systems  Psychiatric/Behavioral: Positive for depression. The patient is nervous/anxious.   All other systems reviewed and are negative.   currently breastfeeding.There is no height or weight on file to calculate BMI.  General Appearance: Casual  Eye Contact:  Fair  Speech:  Clear and Coherent  Volume:  Normal  Mood:  Anxious and Depressed  Affect:  Congruent  Thought Process:  Goal Directed and Descriptions of Associations: Intact  Orientation:  Full (Time, Place, and Person)  Thought Content: Logical   Suicidal Thoughts:  No  Homicidal Thoughts:  No  Memory:  Immediate;   Fair Recent;   Fair Remote;   Fair  Judgement:  Fair   Insight:  Fair  Psychomotor Activity:  Normal  Concentration:  Concentration: Fair and Attention Span: Fair  Recall:  [employer] of Knowledge: Fair  Language: Fair  Akathisia:  No  Handed:  Right  AIMS (if indicated): Denies tremors, rigidity  Assets:  Communication Skills Desire for Improvement Housing Social Support  ADL's:  Intact  Cognition: WNL  Sleep:  Fair   Screenings: GAD-7     Office Visit from 04/04/2019 in [facility]  Total GAD-7 Score  19    PHQ2-9     Office Visit from 06/19/2019 in [facility] Office Visit from 04/24/2019 in [facility] [place] Office Visit from 04/04/2019 in [facility] Nutrition from 09/21/2018 in [facility] [place] Nutrition from 05/04/2018 in [facility] [place]  PHQ-2 Total Score  3  5  4  2   0  PHQ-9 Total Score  12  18  16  5   -       Assessment and Plan: Shealin is a 33 year old female who has a history of GAD, insomnia, hyperlipidemia was evaluated by telemedicine today.  Patient is biologically predisposed given her family history.  She also has psychosocial stressors of recent death of her father, having a newborn, job related stressors.  Patient continues to struggle with anxiety and depressive symptoms.  Patient had PHQ 9 done today and she scored 13 on the same.  It is likely that she may also be depressed.  Discussed augmenting Prozac with Abilify.  Also discussed to have more frequent psychotherapy sessions.  Plan GAD- some progress  Prozac 80 mg p.o. daily Hydroxyzine 25 mg p.o. nightly as needed for anxiety attacks  MDD recurrent-unstable PHQ-9 equals 13 Continue Prozac 80 mg p.o. daily Add Abilify 2 mg p.o. daily. Discussed with patient the breast-feeding implications of Abilify.   Bereavement- improving She will continue to work with her therapist.  She sees Ms. Peacock.  Insomnia- improving She will continue to work on her sleep  hygiene.  Patient is aware about the breast-feeding implications of medications like Prozac, BuSpar and hydroxyzine.  Labs reviewed-TSH-0.90-within normal limits. Reviewed lipid panel-04/04/2019- elevated-she will continue to work with her primary care provider.   Hemoglobin A1c-04/04/2019-within normal limits.  Patient will benefit from an EKG-since she is on Abilify.  Follow-up in clinic in 4 weeks or sooner if needed.  October 1 at 2:45 PM  I have spent atleast 15 minutes non face to face with patient today. More than 50 % of the time was spent for psychoeducation and supportive psychotherapy and care coordination. This note was generated in part or whole with voice recognition software. Voice recognition is usually quite accurate but there are transcription errors that can and very often do occur. I apologize for any typographical errors that were not detected and corrected.            Jomarie LongsSaramma Domingue Coltrain, MD 06/19/2019, 5:42 PM

## 2019-07-08 ENCOUNTER — Ambulatory Visit: Payer: BC Managed Care – PPO | Admitting: Licensed Clinical Social Worker

## 2019-07-11 ENCOUNTER — Ambulatory Visit: Payer: BC Managed Care – PPO | Admitting: Licensed Clinical Social Worker

## 2019-07-11 ENCOUNTER — Other Ambulatory Visit: Payer: Self-pay

## 2019-07-18 ENCOUNTER — Other Ambulatory Visit: Payer: Self-pay

## 2019-07-18 ENCOUNTER — Ambulatory Visit (INDEPENDENT_AMBULATORY_CARE_PROVIDER_SITE_OTHER): Payer: BC Managed Care – PPO | Admitting: Psychiatry

## 2019-07-18 ENCOUNTER — Encounter: Payer: Self-pay | Admitting: Psychiatry

## 2019-07-18 DIAGNOSIS — Z634 Disappearance and death of family member: Secondary | ICD-10-CM | POA: Diagnosis not present

## 2019-07-18 DIAGNOSIS — F411 Generalized anxiety disorder: Secondary | ICD-10-CM

## 2019-07-18 DIAGNOSIS — G4701 Insomnia due to medical condition: Secondary | ICD-10-CM

## 2019-07-18 DIAGNOSIS — F330 Major depressive disorder, recurrent, mild: Secondary | ICD-10-CM | POA: Diagnosis not present

## 2019-07-18 DIAGNOSIS — Z9189 Other specified personal risk factors, not elsewhere classified: Secondary | ICD-10-CM

## 2019-07-18 NOTE — Progress Notes (Signed)
Virtual Visit via Video Note  I connected with Andrea Roberson on 07/18/19 at  2:45 PM EDT by a video enabled telemedicine application and verified that I am speaking with the correct person using two identifiers.   I discussed the limitations of evaluation and management by telemedicine and the availability of in person appointments. The patient expressed understanding and agreed to proceed.   I discussed the assessment and treatment plan with the patient. The patient was provided an opportunity to ask questions and all were answered. The patient agreed with the plan and demonstrated an understanding of the instructions.   The patient was advised to call back or seek an in-person evaluation if the symptoms worsen or if the condition fails to improve as anticipated.   [place] MD OP Progress Note  07/18/2019 5:13 PM [place]  MRN:  [id number]  Chief Complaint:  Chief Complaint    Follow-up     HPI: Andrea Roberson is a 33 year old female, married, lives in [place] , has a history of GAD, hyperlipidemia, insomnia was evaluated by telemedicine today.  Patient today reports the Prozac as well as the Abilify combination is helpful with her mood.  She has not had any significant panic attacks.  She did have only a couple of mild panic attacks since her last visit.  She reports that is an improvement for her.  She reports sleep is good now that her baby sleeps through the night.  She denies any appetite changes.  She reports her appetite is fair at this time.  Patient denies any suicidality, homicidality or perceptual disturbances.  She has had psychotherapy sessions with a new therapist-Jordan with [facility].  She reports her therapist is placed in [place] and they have been doing telehealth.  She sees the therapist on a weekly basis and that is working out well.  She denies any other concerns today. Visit Diagnosis:    ICD-10-CM   1. Generalized anxiety disorder  F41.1   2. MDD (major  depressive disorder), recurrent episode, mild ([place])  F33.0   3. Bereavement  Z63.4   4. Insomnia due to medical condition  G47.01   5. At risk for long QT syndrome  Z91.89 EKG 12-Lead    Past Psychiatric History: I have reviewed past psychiatric history from my progress note on 05/15/2019.  Past trials of Prozac, trazodone, BuSpar, Zoloft  Past Medical History:  Past Medical History:  Diagnosis Date  . Anxiety   . Asthma   . Bipolar disorder ([place])   . Depression   . Frequent headaches   . GERD (gastroesophageal reflux disease)   . Gestational diabetes   . History of gestational diabetes 2016  . Hyperlipidemia   . Migraines   . Obesity (BMI 35.0-39.9 without comorbidity) 12/24/2015  . Tarsal coalition of left foot   . UTI (urinary tract infection)   . Wears contact lenses     Past Surgical History:  Procedure Laterality Date  . CESAREAN SECTION  2015  . CESAREAN SECTION WITH BILATERAL TUBAL LIGATION N/A 11/19/2018   Procedure: CESAREAN SECTION WITH BILATERAL TUBAL LIGATION;  Surgeon: Rubie Maid, MD;  Location: [facility];  Service: Obstetrics;  Laterality: N/A;  TOB 0822 APGAR 8/8    . ESOPHAGOGASTRODUODENOSCOPY (EGD) WITH PROPOFOL N/A 08/30/2017   Procedure: ESOPHAGOGASTRODUODENOSCOPY (EGD) WITH PROPOFOL;  Surgeon: Lin Landsman, MD;  Location: [facility];  Service: Endoscopy;  Laterality: N/A;  . WISDOM TOOTH EXTRACTION      Family Psychiatric History: I  have reviewed family psychiatric history from my progress note on 05/15/2019  Family History:  Family History  Problem Relation Age of Onset  . Heart disease Mother   . Diabetes Mother   . Anxiety disorder Mother   . Arthritis Mother   . Skin cancer Mother   . Diabetes Father   . Skin cancer Father   . Arthritis Father   . Colon polyps Father   . Peptic Ulcer Disease Father   . Colon polyps Brother   . Colon cancer Paternal Aunt   . Heart disease Maternal Grandmother   . Colon cancer Paternal  Grandfather     Social History: I have reviewed social history from my progress note on 05/15/2019 Social History   Socioeconomic History  . Marital status: Married    Spouse name: Not on file  . Number of children: Not on file  . Years of education: Not on file  . Highest education level: Not on file  Occupational History  . Not on file  Social Needs  . Financial resource strain: Not on file  . Food insecurity    Worry: Not on file    Inability: Not on file  . Transportation needs    Medical: Not on file    Non-medical: Not on file  Tobacco Use  . Smoking status: Former Smoker    Packs/day: 1.50    Years: 8.00    Pack years: 12.00    Types: Cigarettes    Quit date: 09/16/2012    Years since quitting: 6.8  . Smokeless tobacco: Never Used  Substance and Sexual Activity  . Alcohol use: Yes    Comment: 2 glasswine/month  . Drug use: No  . Sexual activity: Yes    Birth control/protection: None  Lifestyle  . Physical activity    Days per week: Not on file    Minutes per session: Not on file  . Stress: Not on file  Relationships  . Social Musician on phone: Not on file    Gets together: Not on file    Attends religious service: Not on file    Active member of club or organization: Not on file    Attends meetings of clubs or organizations: Not on file    Relationship status: Not on file  Other Topics Concern  . Not on file  Social History Narrative   Married   5 yo son , 5 month daughter   Runner, broadcasting/film/video- pre K       Allergies: No Known Allergies  Metabolic Disorder Labs: Lab Results  Component Value Date   HGBA1C 5.6 04/04/2019   No results found for: PROLACTIN Lab Results  Component Value Date   CHOL 238 (H) 04/04/2019   TRIG 360 (H) 04/04/2019   HDL 40 04/04/2019   CHOLHDL 6.0 (H) 04/04/2019   VLDL 34.8 02/19/2018   LDLCALC 126 (H) 04/04/2019   LDLCALC 113 (H) 02/19/2018   Lab Results  Component Value Date   TSH 0.90 05/29/2019   TSH  1.180 05/08/2018    Therapeutic Level Labs: No results found for: LITHIUM No results found for: VALPROATE No components found for:  CBMZ  Current Medications: Current Outpatient Medications  Medication Sig Dispense Refill  . ARIPiprazole (ABILIFY) 2 MG tablet Take 1 tablet (2 mg total) by mouth daily. 30 tablet 1  . cetirizine (ZYRTEC) 10 MG tablet Take 10 mg by mouth daily as needed for allergies.     Marland Kitchen FLUoxetine (PROZAC)  40 MG capsule Take 2 capsules (80 mg total) by mouth daily. 180 capsule 0  . fluticasone (FLONASE) 50 MCG/ACT nasal spray Place 2 sprays into both nostrils daily as needed for allergies.     . hydrOXYzine (VISTARIL) 25 MG capsule Take 1 capsule (25 mg total) by mouth daily as needed. For sleep 90 capsule 0  . meloxicam (MOBIC) 15 MG tablet Take 1 tablet (15 mg total) by mouth daily. 30 tablet 1   No current facility-administered medications for this visit.      Musculoskeletal: Strength & Muscle Tone: UTA Gait & Station: normal Patient leans: N/A  Psychiatric Specialty Exam: Review of Systems  Psychiatric/Behavioral: Negative for hallucinations and suicidal ideas. The patient is not nervous/anxious.   All other systems reviewed and are negative.   currently breastfeeding.There is no height or weight on file to calculate BMI.  General Appearance: Casual  Eye Contact:  Fair  Speech:  Clear and Coherent  Volume:  Normal  Mood:  Euthymic  Affect:  Appropriate  Thought Process:  Goal Directed and Descriptions of Associations: Intact  Orientation:  Full (Time, Place, and Person)  Thought Content: Logical   Suicidal Thoughts:  No  Homicidal Thoughts:  No  Memory:  Immediate;   Fair Recent;   Fair Remote;   Fair  Judgement:  Fair  Insight:  Fair  Psychomotor Activity:  Normal  Concentration:  Concentration: Fair and Attention Span: Fair  Recall:  [employer]  Fund of Knowledge: Fair  Language: Fair  Akathisia:  No  Handed:  Right  AIMS (if indicated):  Denies tremors, rigidity  Assets:  Communication Skills Desire for Improvement Housing Intimacy Physical Health Social Support Talents/Skills  ADL's:  Intact  Cognition: WNL  Sleep:  Fair   Screenings: GAD-7     Office Visit from 04/04/2019 in [facility]  Total GAD-7 Score  19    PHQ2-9     [facility] Office [facility]/24/2019 in [facility] [place] Office Visit from 04/04/2019 in [facility] Nutrition [facility] [place] Nutrition [facility] [place]  PHQ-2 Total Score  3  5  4  2   0  PHQ-9 Total Score  12  18  16  5   -       Assessment and Plan: Andrea CosierCrystal is a 33 year old female who has a history of GAD, insomnia, hyperlipidemia was evaluated by telemedicine today.  Patient is biologically predisposed given her family history.  She also has psychosocial stressors of recent death of her father, having a newborn, job related stressors.  Patient however is making progress on the current medication regimen.  She also has started psychotherapy sessions which are going well.  Plan as noted below.  Plan GAD-improving Prozac 80 mg p.o. daily Hydroxyzine 25 mg p.o. nightly as needed for anxiety attacks  MDD-improving Prozac as prescribed Abilify 2 mg p.o. daily Patient is aware about the breast-feeding implications of Abilify and Prozac.  Bereavement-improving She will [facility] to [facility].  Insomnia-improving She will work on her sleep hygiene techniques  Follow-up in clinic in 6 weeks or sooner if needed.  November 19 at 4:30 PM  I have spent atleast 15 minutes non face to face with patient today. More than 50 % of the time was spent for psychoeducation and supportive psychotherapy and care coordination. This note was generated in part or whole with  voice recognition software. Voice recognition is  usually quite accurate but there are transcription errors that can and very often do occur. I apologize for any typographical errors that were not detected and corrected.       Jomarie Longs, MD 07/18/2019, 5:13 PM

## 2019-07-25 ENCOUNTER — Encounter: Payer: Self-pay | Admitting: Family

## 2019-08-17 ENCOUNTER — Other Ambulatory Visit: Payer: Self-pay | Admitting: Psychiatry

## 2019-08-17 DIAGNOSIS — F411 Generalized anxiety disorder: Secondary | ICD-10-CM

## 2019-09-05 ENCOUNTER — Ambulatory Visit (INDEPENDENT_AMBULATORY_CARE_PROVIDER_SITE_OTHER): Payer: BC Managed Care – PPO | Admitting: Psychiatry

## 2019-09-05 ENCOUNTER — Other Ambulatory Visit: Payer: Self-pay

## 2019-09-05 ENCOUNTER — Encounter: Payer: Self-pay | Admitting: Psychiatry

## 2019-09-05 DIAGNOSIS — F330 Major depressive disorder, recurrent, mild: Secondary | ICD-10-CM

## 2019-09-05 DIAGNOSIS — Z634 Disappearance and death of family member: Secondary | ICD-10-CM

## 2019-09-05 DIAGNOSIS — G4701 Insomnia due to medical condition: Secondary | ICD-10-CM | POA: Diagnosis not present

## 2019-09-05 DIAGNOSIS — F411 Generalized anxiety disorder: Secondary | ICD-10-CM | POA: Diagnosis not present

## 2019-09-05 MED ORDER — ARIPIPRAZOLE 2 MG PO TABS: 2.0000 mg | ORAL_TABLET | Freq: Every day | ORAL | 0 refills | Status: DC

## 2019-09-05 MED ORDER — HYDROXYZINE HCL 25 MG PO TABS: 12.5000 mg | ORAL_TABLET | Freq: Two times a day (BID) | ORAL | 1 refills | Status: DC | PRN

## 2019-09-05 NOTE — Progress Notes (Signed)
Virtual Visit via Video Note  I connected with Andrea Roberson on 09/05/19 at  4:30 PM EST by a video enabled telemedicine application and verified that I am speaking with the correct person using two identifiers.   I discussed the limitations of evaluation and management by telemedicine and the availability of in person appointments. The patient expressed understanding and agreed to proceed.    I discussed the assessment and treatment plan with the patient. The patient was provided an opportunity to ask questions and all were answered. The patient agreed with the plan and demonstrated an understanding of the instructions.   The patient was advised to call back or seek an in-person evaluation if the symptoms worsen or if the condition fails to improve as anticipated.   BH MD OP Progress Note  09/05/2019 4:58 PM Andrea Roberson  MRN:  [id number]  Chief Complaint:  Chief Complaint    Follow-up     HPI: Andrea CosierCrystal is a 33 year old female, married, lives in [place], has a history of GAD, hyperlipidemia, insomnia was evaluated by telemedicine today.  Patient today reports she is currently struggling with some anxiety symptoms on and off.  Last week she felt overwhelmed and nervous.  She reports this week she has been better.  She denies any significant anxiety symptoms this week.  She reports she continues to take the Prozac.  The Prozac is at an 80 mg.  She is tolerating it well.  She is also taking the Abilify which helps.  She denies any side effects.  She reports sleep is good whenever the baby lets her to rest at night.  Patient reports appetite as fair.  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  Patient currently has hydroxyzine which she has been using it 1-2 times a week  for sleep.  She continues to follow-up with her therapist and therapy sessions are going well. Patient denies any other concerns today. Visit Diagnosis:    ICD-10-CM   1. Generalized anxiety disorder  F41.1 hydrOXYzine (ATARAX/VISTARIL) 25 MG tablet    ARIPiprazole (ABILIFY) 2 MG tablet  2. 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 Bereavement  Z63.4 ARIPiprazole (ABILIFY) 2 MG tablet    Past Psychiatric History: I have reviewed past psychiatric history from my progress note on 05/15/2019.  Past trials of Prozac, trazodone, BuSpar, Zoloft.  Past Medical History:  Past Medical History:  Diagnosis Date  . Anxiety   . Asthma   . Bipolar disorder (HCC)   . Depression   . Frequent headaches   . GERD (gastroesophageal reflux disease)   . Gestational diabetes   . History of gestational diabetes 2016  . Hyperlipidemia   . Migraines   . Obesity (BMI 35.0-39.9 without comorbidity) 12/24/2015  . Tarsal coalition of left foot   . UTI (urinary tract infection)   . Wears contact lenses     Past Surgical History:  Procedure Laterality Date  . CESAREAN SECTION  2015  . CESAREAN SECTION WITH BILATERAL TUBAL LIGATION N/A 11/19/2018   Procedure: CESAREAN SECTION WITH BILATERAL TUBAL LIGATION;  Surgeon: Hildred Laserherry, Anika, MD;  Location: [facility] ORS;  Service:  Obstetrics;  Laterality: N/A;  TOB 0822 APGAR 8/8    . ESOPHAGOGASTRODUODENOSCOPY (EGD) WITH PROPOFOL N/A 08/30/2017   Procedure: ESOPHAGOGASTRODUODENOSCOPY (EGD) WITH PROPOFOL;  Surgeon: Toney Reil, MD;  Location: [facility];  Service: Endoscopy;  Laterality: N/A;  . WISDOM TOOTH EXTRACTION      Family Psychiatric History: I have reviewed family psychiatric history from my progress note on 05/15/2019.  Family History:  Family History  Problem Relation Age of Onset  . Heart disease Mother   . Diabetes  Mother   . Anxiety disorder Mother   . Arthritis Mother   . Skin cancer Mother   . Diabetes Father   . Skin cancer Father   . Arthritis Father   . Colon polyps Father   . Peptic Ulcer Disease Father   . Colon polyps Brother   . Colon cancer Paternal Aunt   . Heart disease Maternal Grandmother   . Colon cancer Paternal Grandfather     Social History: I have reviewed social history from my progress note on 05/15/2019. Social History   Socioeconomic History  . Marital status: Married    Spouse name: Not on file  . Number of children: Not on file  . Years of education: Not on file  . Highest education level: Not on file  Occupational History  . Not on file  Social Needs  . Financial resource strain: Not on file  . Food insecurity    Worry: Not on file    Inability: Not on file  . Transportation needs    Medical: Not on file    Non-medical: Not on file  Tobacco Use  . Smoking status: Former Smoker    Packs/day: 1.50    Years: 8.00    Pack years: 12.00    Types: Cigarettes    Quit date: 09/16/2012    Years since quitting: 6.9  . Smokeless tobacco: Never Used  Substance and Sexual Activity  . Alcohol use: Yes    Comment: 2 glasswine/month  . Drug use: No  . Sexual activity: Yes    Birth control/protection: None  Lifestyle  . Physical activity    Days per week: Not on file    Minutes per session: Not on file  . Stress: Not on file  Relationships  . Social Musician on phone: Not on file    Gets together: Not on file    Attends religious service: Not on file    Active member of club or organization: Not on file    Attends meetings of clubs or organizations: Not on file    Relationship status: Not on file  Other Topics Concern  . Not on file  Social History Narrative   Married   5 yo son , 5 month daughter   Runner, broadcasting/film/video- pre K       Allergies: No Known Allergies  Metabolic Disorder Labs: Lab Results  Component Value Date   HGBA1C 5.6 04/04/2019    No results found for: PROLACTIN Lab Results  Component Value Date   CHOL 238 (H) 04/04/2019   TRIG 360 (H) 04/04/2019   HDL 40 04/04/2019   CHOLHDL 6.0 (H) 04/04/2019   VLDL 34.8 02/19/2018   LDLCALC 126 (H) 04/04/2019   LDLCALC 113 (H) 02/19/2018   Lab Results  Component Value Date   TSH 0.90 05/29/2019   TSH 1.180 05/08/2018    Therapeutic Level Labs: No results found for: LITHIUM No results found for: VALPROATE No components found  for:  CBMZ  Current Medications: Current Outpatient Medications  Medication Sig Dispense Refill  . ARIPiprazole (ABILIFY) 2 MG tablet Take 1 tablet (2 mg total) by mouth daily. 90 tablet 0  . cetirizine (ZYRTEC) 10 MG tablet Take 10 mg by mouth daily as needed for allergies.     Marland Kitchen FLUoxetine (PROZAC) 40 MG capsule TAKE 2 CAPSULES(80 MG) BY MOUTH DAILY 180 capsule 0  . fluticasone (FLONASE) 50 MCG/ACT nasal spray Place 2 sprays into both nostrils daily as needed for allergies.     . meloxicam (MOBIC) 15 MG tablet Take 1 tablet (15 mg total) by mouth daily. 30 tablet 1  . hydrOXYzine (ATARAX/VISTARIL) 25 MG tablet Take 0.5-1 tablets (12.5-25 mg total) by mouth 2 (two) times daily as needed. For anxiety, sleep 60 tablet 1   No current facility-administered medications for this visit.      Musculoskeletal: Strength & Muscle Tone: UTA Gait & Station: normal Patient leans: N/A  Psychiatric Specialty Exam: Review of Systems  Psychiatric/Behavioral: The patient is nervous/anxious.   All other systems reviewed and are negative.   currently breastfeeding.There is no height or weight on file to calculate BMI.  General Appearance: Casual  Eye Contact:  Fair  Speech:  Clear and Coherent  Volume:  Normal  Mood:  Anxious  Affect:  Congruent  Thought Process:  Goal Directed and Descriptions of Associations: Intact  Orientation:  Full (Time, Place, and Person)  Thought Content: Logical   Suicidal Thoughts:  No  Homicidal Thoughts:  No   Memory:  Immediate;   Fair Recent;   Fair Remote;   Fair  Judgement:  Fair  Insight:  Fair  Psychomotor Activity:  Normal  Concentration:  Concentration: Fair and Attention Span: Fair  Recall:  [employer] of Knowledge: Fair  Language: Fair  Akathisia:  No  Handed:  Right  AIMS (if indicated): denies tremors, rigidity,stiffness  Assets:  Communication Skills Desire for Improvement Social Support  ADL's:  Intact  Cognition: WNL  Sleep:  Fair   Screenings: GAD-7     Office Visit from 04/04/2019 in [facility]  Total GAD-7 Score  19    PHQ2-9     Office Visit from 06/19/2019 in [place] Office Visit from 04/24/2019 in [place] Office Visit from 04/04/2019 in [facility] from 09/21/2018 in [place] [facility] from 05/04/2018 in [place]  PHQ-2 Total Score  3  5  4  2   0  PHQ-9 Total Score  12  18  16  5   -       Assessment and Plan: Delanna is a 33 year old female, has a history of GAD, insomnia, hyperlipidemia was evaluated by telemedicine today.  Patient is biologically predisposed given her family history.  She also has psychosocial stressors of recent death of her father, having a newborn, job related stressors.  Patient however is currently making progress.  She however continues to struggle with anxiety symptoms on and off, discussed medication changes as noted below.  Plan GAD-improving Prozac 80 mg p.o. daily Change hydroxyzine to 12.5 to 25 mg p.o. twice daily as needed for severe anxiety attacks.  Discussed breast-feeding implications for hydroxyzine.  She will limit use.  DD-stable Prozac as prescribed Abilify 2 mg p.o. daily  Bereavement-improving She will continue to work with her therapist-Jordan at [facility].  Insomnia-improving She will continue to work on sleep hygiene  Follow-up in clinic in 8 weeks  or sooner if needed.  January 11  at 4 PM  I have spent atleast 15 minutes non face to face with patient today. More than 50 % of the time was spent for psychoeducation and supportive psychotherapy and care coordination. This note was generated in part or whole with voice recognition software. Voice recognition is usually quite accurate but there are transcription errors that can and very often do occur. I apologize for any typographical errors that were not detected and corrected.        Jomarie Longs, MD 09/05/2019, 4:58 PM

## 2019-09-15 NOTE — Progress Notes (Signed)
No contact.

## 2019-10-28 ENCOUNTER — Ambulatory Visit: Payer: BC Managed Care – PPO | Admitting: Psychiatry

## 2019-10-28 ENCOUNTER — Other Ambulatory Visit: Payer: Self-pay

## 2019-11-01 ENCOUNTER — Other Ambulatory Visit: Payer: Self-pay

## 2019-11-01 ENCOUNTER — Ambulatory Visit (INDEPENDENT_AMBULATORY_CARE_PROVIDER_SITE_OTHER): Payer: BC Managed Care – PPO | Admitting: Psychiatry

## 2019-11-01 ENCOUNTER — Encounter: Payer: Self-pay | Admitting: Psychiatry

## 2019-11-01 DIAGNOSIS — G4701 Insomnia due to medical condition: Secondary | ICD-10-CM | POA: Diagnosis not present

## 2019-11-01 DIAGNOSIS — F411 Generalized anxiety disorder: Secondary | ICD-10-CM | POA: Diagnosis not present

## 2019-11-01 DIAGNOSIS — F330 Major depressive disorder, recurrent, mild: Secondary | ICD-10-CM | POA: Diagnosis not present

## 2019-11-01 MED ORDER — TRAZODONE HCL 50 MG PO TABS: 25.0000 mg | ORAL_TABLET | Freq: Every evening | ORAL | 1 refills | Status: DC | PRN

## 2019-11-01 NOTE — Progress Notes (Signed)
Virtual Visit via Video Note  I connected with Andrea Roberson on 11/01/19 at  9:20 AM EST by a video enabled telemedicine application and verified that I am speaking with the correct person using two identifiers.   I discussed the limitations of evaluation and management by telemedicine and the availability of in person appointments. The patient expressed understanding and agreed to proceed.    I discussed the assessment and treatment plan with the patient. The patient was provided an opportunity to ask questions and all were answered. The patient agreed with the plan and demonstrated an understanding of the instructions.   The patient was advised to call back or seek an in-person evaluation if the symptoms worsen or if the condition fails to improve as anticipated.   BH MD OP Progress Note  11/01/2019 12:07 PM Andrea Roberson  MRN:  [id number]  Chief Complaint:  Chief Complaint    Follow-up     HPI: Andrea Roberson is a 34 year old-year-old, married, lives in [place], has a history of GAD, hyperlipidemia, insomnia, was evaluated by telemedicine today.  Patient today reports she is currently struggling with a lot of psychosocial stressors.  She reports her dad's friend passed away recently.  She reports her uncle had a stroke and also got infected with COVID recently and she got the news that he is not going to make it.  Patient reports she has has been having a lot of racing thoughts at night and this has been affecting her sleep.  She reports work continues to go well.  She can work from home.  She denies any suicidality, homicidality or perceptual disturbances.  She continues to work with her therapist and reports therapy sessions is beneficial.  She is compliant on Prozac and Abilify.  She denies any side effects.  Discussed adding trazodone for sleep.  She agrees with plan.  Provided education about breast-feeding implications of trazodone.    Visit Diagnosis:    ICD-10-CM    1. Generalized anxiety disorder  F41.1   2. MDD (major depressive disorder), recurrent episode, mild (HCC)  F33.0 traZODone (DESYREL) 50 MG tablet  3. Insomnia due to medical condition  G47.01 traZODone (DESYREL) 50 MG tablet    Past Psychiatric History: I have reviewed past psychiatric history from my progress note on 05/15/2019.  Past trials of Prozac, trazodone, BuSpar, Zoloft  Past Medical History:  Past Medical History:  Diagnosis Date  . Anxiety   . Asthma   . Bipolar disorder (HCC)   . Depression   . Frequent headaches   . GERD (gastroesophageal reflux disease)   . Gestational diabetes   . History of gestational diabetes 2016  . Hyperlipidemia   . Migraines   . Obesity (BMI 35.0-39.9 without comorbidity) 12/24/2015  . Tarsal coalition of left foot   . UTI (urinary tract infection)   . Wears contact lenses     Past Surgical History:  Procedure Laterality Date  . CESAREAN SECTION  2015  . CESAREAN SECTION WITH BILATERAL TUBAL LIGATION N/A 11/19/2018   Procedure: CESAREAN SECTION WITH BILATERAL TUBAL LIGATION;  Surgeon: Hildred Laser, MD;  Location: [facility];  Service: Obstetrics;  Laterality: N/A;  TOB 0822 APGAR 8/8    . ESOPHAGOGASTRODUODENOSCOPY (EGD) WITH PROPOFOL N/A 08/30/2017   Procedure: ESOPHAGOGASTRODUODENOSCOPY (EGD) WITH PROPOFOL;  Surgeon: Toney Reil, MD;  Location: [facility];  Service: Endoscopy;  Laterality: N/A;  . WISDOM TOOTH EXTRACTION      Family Psychiatric History: I have reviewed family  psychiatric history from my progress note on 05/15/2019. Family History:  Family History  Problem Relation Age of Onset  . Heart disease Mother   . Diabetes Mother   . Anxiety disorder Mother   . Arthritis Mother   . Skin cancer Mother   . Diabetes Father   . Skin cancer Father   . Arthritis Father   . Colon polyps Father   . Peptic Ulcer Disease Father   . Colon polyps Brother   . Colon cancer Paternal Aunt   . Heart disease Maternal  Grandmother   . Colon cancer Paternal Grandfather     Social History: I have reviewed social history from my progress note on 05/15/2019. Social History   Socioeconomic History  . Marital status: Married    Spouse name: Not on file  . Number of children: Not on file  . Years of education: Not on file  . Highest education level: Not on file  Occupational History  . Not on file  Tobacco Use  . Smoking status: Former Smoker    Packs/day: 1.50    Years: 8.00    Pack years: 12.00    Types: Cigarettes    Quit date: 09/16/2012    Years since quitting: 7.1  . Smokeless tobacco: Never Used  Substance and Sexual Activity  . Alcohol use: Yes    Comment: 2 glasswine/month  . Drug use: No  . Sexual activity: Yes    Birth control/protection: None  Other Topics Concern  . Not on file  Social History Narrative   Married   5 yo son , 5 month daughter   Runner, broadcasting/film/video- pre K      Social Determinants of Health   Financial Resource Strain:   . Difficulty of Paying Living Expenses: Not on file  Food Insecurity:   . Worried About Programme researcher, broadcasting/film/video in the Last Year: Not on file  . Ran Out of Food in the Last Year: Not on file  Transportation Needs:   . Lack of Transportation (Medical): Not on file  . Lack of Transportation (Non-Medical): Not on file  Physical Activity:   . Days of Exercise per Week: Not on file  . Minutes of Exercise per Session: Not on file  Stress:   . Feeling of Stress : Not on file  Social Connections:   . Frequency of Communication with Friends and Family: Not on file  . Frequency of Social Gatherings with Friends and Family: Not on file  . Attends Religious Services: Not on file  . Active Member of Clubs or Organizations: Not on file  . Attends Banker Meetings: Not on file  . Marital Status: Not on file    Allergies: No Known Allergies  Metabolic Disorder Labs: Lab Results  Component Value Date   HGBA1C 5.6 04/04/2019   No results found for:  PROLACTIN Lab Results  Component Value Date   CHOL 238 (H) 04/04/2019   TRIG 360 (H) 04/04/2019   HDL 40 04/04/2019   CHOLHDL 6.0 (H) 04/04/2019   VLDL 34.8 02/19/2018   LDLCALC 126 (H) 04/04/2019   LDLCALC 113 (H) 02/19/2018   Lab Results  Component Value Date   TSH 0.90 05/29/2019   TSH 1.180 05/08/2018    Therapeutic Level Labs: No results found for: LITHIUM No results found for: VALPROATE No components found for:  CBMZ  Current Medications: Current Outpatient Medications  Medication Sig Dispense Refill  . ARIPiprazole (ABILIFY) 2 MG tablet Take 1 tablet (2  mg total) by mouth daily. 90 tablet 0  . cetirizine (ZYRTEC) 10 MG tablet Take 10 mg by mouth daily as needed for allergies.     Marland Kitchen FLUoxetine (PROZAC) 40 MG capsule TAKE 2 CAPSULES(80 MG) BY MOUTH DAILY 180 capsule 0  . fluticasone (FLONASE) 50 MCG/ACT nasal spray Place 2 sprays into both nostrils daily as needed for allergies.     . hydrOXYzine (ATARAX/VISTARIL) 25 MG tablet Take 0.5-1 tablets (12.5-25 mg total) by mouth 2 (two) times daily as needed. For anxiety, sleep 60 tablet 1  . meloxicam (MOBIC) 15 MG tablet Take 1 tablet (15 mg total) by mouth daily. 30 tablet 1  . traZODone (DESYREL) 50 MG tablet Take 0.5-1 tablets (25-50 mg total) by mouth at bedtime as needed for sleep. 30 tablet 1   No current facility-administered medications for this visit.     Musculoskeletal: Strength & Muscle Tone: UTA Gait & Station: normal Patient leans: N/A  Psychiatric Specialty Exam: Review of Systems  Psychiatric/Behavioral: Positive for sleep disturbance. The patient is nervous/anxious.   All other systems reviewed and are negative.   currently breastfeeding.There is no height or weight on file to calculate BMI.  General Appearance: Casual  Eye Contact:  Fair  Speech:  Clear and Coherent  Volume:  Normal  Mood:  Anxious  Affect:  Congruent  Thought Process:  Goal Directed and Descriptions of Associations: Intact   Orientation:  Full (Time, Place, and Person)  Thought Content: Logical   Suicidal Thoughts:  No  Homicidal Thoughts:  No  Memory:  Immediate;   Fair Recent;   Fair Remote;   Fair  Judgement:  Fair  Insight:  Fair  Psychomotor Activity:  Normal  Concentration:  Concentration: Fair and Attention Span: Fair  Recall:  [employer] of Knowledge: Fair  Language: Fair  Akathisia:  No  Handed:  Right  AIMS (if indicated):Denies tremors, rigidity  Assets:  Communication Skills Desire for Improvement Social Support  ADL's:  Intact  Cognition: WNL  Sleep:  Poor   Screenings: GAD-7     Office Visit from 04/04/2019 in [facility]  Total GAD-7 Score  19    PHQ2-9     Office Visit from 06/19/2019 in [facility] Office Visit from 04/24/2019 in [facility] Office Visit from 04/04/2019 in [facility] Nutrition from 09/21/2018 in [facility] [place] Nutrition from 05/04/2018 in [facility] [place]  PHQ-2 Total Score  3  5  4  2   0  PHQ-9 Total Score  12  18  16  5   --       Assessment and Plan: Andrea Roberson is a 34 year old female, has a history of GAD, insomnia, hyperlipidemia was evaluated by telemedicine today.  Patient is biologically predisposed given her family history.  She also has psychosocial stressors of recent death of her father, having a newborn, health problems of family members, job related stressors and the pandemic.  Patient continues to struggle with sleep problems and will benefit from medication readjustment.  She will continue to benefit from psychotherapy sessions.  Plan GAD-stable Prozac 80 mg p.o. daily Hydroxyzine 25 mg p.o. nightly as needed for anxiety attacks  MDD-improving Prozac as prescribed Abilify 2 mg p.o. daily   Bereavement-improving She will continue to work with her therapist at [facility].  Insomnia-unstable Start trazodone 25 to 50 mg p.o. nightly as  needed  Discussed with patient the breast-feeding implications of medications like Prozac, Abilify and  the trazodone.  Follow-up in clinic in 4 weeks or sooner if needed.  February 19 at 9 AM  I have spent atleast 20 minutes non face to face with patient today. More than 50 % of the time was spent for ordering medications and test ,psychoeducation and supportive psychotherapy and care coordination,as well as documenting clinical information in electronic health record.This note was generated in part or whole with voice recognition software. Voice recognition is usually quite accurate but there are transcription errors that can and very often do occur. I apologize for any typographical errors that were not detected and corrected.       Ursula Alert, MD 11/01/2019, 12:07 PM

## 2019-11-17 ENCOUNTER — Other Ambulatory Visit: Payer: Self-pay | Admitting: Psychiatry

## 2019-11-17 DIAGNOSIS — F411 Generalized anxiety disorder: Secondary | ICD-10-CM

## 2019-12-05 ENCOUNTER — Other Ambulatory Visit: Payer: Self-pay | Admitting: Psychiatry

## 2019-12-05 DIAGNOSIS — F411 Generalized anxiety disorder: Secondary | ICD-10-CM

## 2019-12-05 DIAGNOSIS — Z634 Disappearance and death of family member: Secondary | ICD-10-CM

## 2019-12-06 ENCOUNTER — Ambulatory Visit (INDEPENDENT_AMBULATORY_CARE_PROVIDER_SITE_OTHER): Payer: BC Managed Care – PPO | Admitting: Psychiatry

## 2019-12-06 ENCOUNTER — Encounter: Payer: Self-pay | Admitting: Psychiatry

## 2019-12-06 ENCOUNTER — Other Ambulatory Visit: Payer: Self-pay

## 2019-12-06 DIAGNOSIS — F411 Generalized anxiety disorder: Secondary | ICD-10-CM | POA: Diagnosis not present

## 2019-12-06 DIAGNOSIS — F3341 Major depressive disorder, recurrent, in partial remission: Secondary | ICD-10-CM | POA: Diagnosis not present

## 2019-12-06 DIAGNOSIS — G4701 Insomnia due to medical condition: Secondary | ICD-10-CM | POA: Diagnosis not present

## 2019-12-06 NOTE — Progress Notes (Addendum)
Provider Location : [facility] Patient Location : Home   Virtual Visit via Video Note  I connected with Andrea Roberson on 12/06/19 at  9:00 AM EST by a video enabled telemedicine application and verified that I am speaking with the correct person using two identifiers.   I discussed the limitations of evaluation and management by telemedicine and the availability of in person appointments. The patient expressed understanding and agreed to proceed.     I discussed the assessment and treatment plan with the patient. The patient was provided an opportunity to ask questions and all were answered. The patient agreed with the plan and demonstrated an understanding of the instructions.   The patient was advised to call back or seek an in-person evaluation if the symptoms worsen or if the condition fails to improve as anticipated.   BH MD OP Progress Note  12/06/2019 10:57 AM Andrea Roberson  MRN:  [id number]  Chief Complaint:  Chief Complaint    Follow-up     HPI: Andrea Roberson is a 34 year old, female, married, lives in [place], has a history of GAD, hyperlipidemia, insomnia was evaluated by telemedicine today.  Patient today reports she is currently doing well on the current medication regimen.  She reports sleep continues to be restless on and off since she continues to breast-feed her baby.  She however reports overall it is improving.  Patient stopped taking trazodone since it makes her groggy.  She currently declines any medication changes for sleep.  She also reports she stopped the Abilify since she was concerned about weight changes.  She has started an exercise routine and continues to stay away from Abilify.  She reports overall she is doing good even without it and wants to stay just on the Prozac at this time.  Patient is interested in continuing psychotherapy sessions which are beneficial.  Patient reports she is returning to in person work on Monday and is looking forward to  it.  She currently works from home.  She denies any suicidality, homicidality or perceptual disturbances. Visit Diagnosis:    ICD-10-CM   1. Generalized anxiety disorder  F41.1   2. MDD (major depressive disorder), recurrent, in partial remission (HCC)  F33.41   3. Insomnia due to medical condition  G47.01     Past Psychiatric History: I have reviewed past psychiatric history from my progress note on 05/15/2019.  Past trials of Prozac, trazodone, BuSpar, Zoloft  Past Medical History:  Past Medical History:  Diagnosis Date  . Anxiety   . Asthma   . Bipolar disorder (HCC)   . Depression   . Frequent headaches   . GERD (gastroesophageal reflux disease)   . Gestational diabetes   . History of gestational diabetes 2016  . Hyperlipidemia   . Migraines   . Obesity (BMI 35.0-39.9 without comorbidity) 12/24/2015  . Tarsal coalition of left foot   . UTI (urinary tract infection)   . Wears contact lenses     Past Surgical History:  Procedure Laterality Date  . CESAREAN SECTION  2015  . CESAREAN SECTION WITH BILATERAL TUBAL LIGATION N/A 11/19/2018   Procedure: CESAREAN SECTION WITH BILATERAL TUBAL LIGATION;  Surgeon: Hildred Laser, MD;  Location: [facility];  Service: Obstetrics;  Laterality: N/A;  TOB 0822 APGAR 8/8    . ESOPHAGOGASTRODUODENOSCOPY (EGD) WITH PROPOFOL N/A 08/30/2017   Procedure: ESOPHAGOGASTRODUODENOSCOPY (EGD) WITH PROPOFOL;  Surgeon: Toney Reil, MD;  Location: [facility];  Service: Endoscopy;  Laterality: N/A;  . WISDOM TOOTH  EXTRACTION      Family Psychiatric History: I have reviewed family psychiatric history from my progress note on 05/15/2019.  Family History:  Family History  Problem Relation Age of Onset  . Heart disease Mother   . Diabetes Mother   . Anxiety disorder Mother   . Arthritis Mother   . Skin cancer Mother   . Diabetes Father   . Skin cancer Father   . Arthritis Father   . Colon polyps Father   . Peptic Ulcer Disease Father    . Colon polyps Brother   . Colon cancer Paternal Aunt   . Heart disease Maternal Grandmother   . Colon cancer Paternal Grandfather     Social History: Reviewed social history from my progress note on 05/15/2019. Social History   Socioeconomic History  . Marital status: Married    Spouse name: Not on file  . Number of children: Not on file  . Years of education: Not on file  . Highest education level: Not on file  Occupational History  . Not on file  Tobacco Use  . Smoking status: Former Smoker    Packs/day: 1.50    Years: 8.00    Pack years: 12.00    Types: Cigarettes    Quit date: 09/16/2012    Years since quitting: 7.2  . Smokeless tobacco: Never Used  Substance and Sexual Activity  . Alcohol use: Yes    Comment: 2 glasswine/month  . Drug use: No  . Sexual activity: Yes    Birth control/protection: None  Other Topics Concern  . Not on file  Social History Narrative   Married   5 yo son , 5 month daughter   Runner, broadcasting/film/video- pre K      Social Determinants of Health   Financial Resource Strain:   . Difficulty of Paying Living Expenses: Not on file  Food Insecurity:   . Worried About Programme researcher, broadcasting/film/video in the Last Year: Not on file  . Ran Out of Food in the Last Year: Not on file  Transportation Needs:   . Lack of Transportation (Medical): Not on file  . Lack of Transportation (Non-Medical): Not on file  Physical Activity:   . Days of Exercise per Week: Not on file  . Minutes of Exercise per Session: Not on file  Stress:   . Feeling of Stress : Not on file  Social Connections:   . Frequency of Communication with Friends and Family: Not on file  . Frequency of Social Gatherings with Friends and Family: Not on file  . Attends Religious Services: Not on file  . Active Member of Clubs or Organizations: Not on file  . Attends Banker Meetings: Not on file  . Marital Status: Not on file    Allergies: No Known Allergies  Metabolic Disorder Labs: Lab  Results  Component Value Date   HGBA1C 5.6 04/04/2019   No results found for: PROLACTIN Lab Results  Component Value Date   CHOL 238 (H) 04/04/2019   TRIG 360 (H) 04/04/2019   HDL 40 04/04/2019   CHOLHDL 6.0 (H) 04/04/2019   VLDL 34.8 02/19/2018   LDLCALC 126 (H) 04/04/2019   LDLCALC 113 (H) 02/19/2018   Lab Results  Component Value Date   TSH 0.90 05/29/2019   TSH 1.180 05/08/2018    Therapeutic Level Labs: No results found for: LITHIUM No results found for: VALPROATE No components found for:  CBMZ  Current Medications: Current Outpatient Medications  Medication Sig Dispense  Refill  . cetirizine (ZYRTEC) 10 MG tablet Take 10 mg by mouth daily as needed for allergies.     Marland Kitchen FLUoxetine (PROZAC) 40 MG capsule TAKE 2 CAPSULES(80 MG) BY MOUTH DAILY 180 capsule 0  . fluticasone (FLONASE) 50 MCG/ACT nasal spray Place 2 sprays into both nostrils daily as needed for allergies.     . hydrOXYzine (ATARAX/VISTARIL) 25 MG tablet Take 0.5-1 tablets (12.5-25 mg total) by mouth 2 (two) times daily as needed. For anxiety, sleep 60 tablet 1  . meloxicam (MOBIC) 15 MG tablet Take 1 tablet (15 mg total) by mouth daily. 30 tablet 1   No current facility-administered medications for this visit.     Musculoskeletal: Strength & Muscle Tone: UTA Gait & Station: normal Patient leans: N/A  Psychiatric Specialty Exam: Review of Systems  Psychiatric/Behavioral: Positive for sleep disturbance. Negative for agitation, behavioral problems, confusion, decreased concentration, dysphoric mood, hallucinations, self-injury and suicidal ideas. The patient is not nervous/anxious and is not hyperactive.   All other systems reviewed and are negative.   currently breastfeeding.There is no height or weight on file to calculate BMI.  General Appearance: Casual  Eye Contact:  Fair  Speech:  Clear and Coherent  Volume:  Normal  Mood:  Euthymic  Affect:  Appropriate  Thought Process:  Goal Directed and  Descriptions of Associations: Intact  Orientation:  Full (Time, Place, and Person)  Thought Content: Logical   Suicidal Thoughts:  No  Homicidal Thoughts:  No  Memory:  Immediate;   Fair Recent;   Fair Remote;   Fair  Judgement:  Fair  Insight:  Fair  Psychomotor Activity:  Normal  Concentration:  Concentration: Fair and Attention Span: Fair  Recall:  [employer] of Knowledge: Fair  Language: Fair  Akathisia:  No  Handed:  Right  AIMS (if indicated): Denies tremors, rigidity  Assets:  Communication Skills Desire for Improvement Housing Social Support Talents/Skills Transportation Vocational/Educational  ADL's:  Intact  Cognition: WNL  Sleep:  restless   Screenings: GAD-7     Office Visit from 04/04/2019 in [facility]  Total GAD-7 Score  19    PHQ2-9     Office Visit from 06/19/2019 in [facility] Office Visit from 04/24/2019 in [facility] Office Visit from 04/04/2019 in [facility] Nutrition from 09/21/2018 in [facility] [place] Nutrition from 05/04/2018 in [facility] [place]  PHQ-2 Total Score  3  5  4  2   0  PHQ-9 Total Score  12  18  16  5   --       Assessment and Plan: Laprecious is a 34 year old female, has a history of GAD, insomnia, hyperlipidemia was evaluated by telemedicine today.  Patient is biologically predisposed given her family history.  She also has psychosocial stressors of recent death of her father, having a newborn, health problems of family members, job related stressors and the pandemic.  Patient however is currently making progress.  Plan as noted below.  Plan GAD-stable Prozac 80 mg p.o. daily Hydroxyzine 25 mg p.o. nightly as needed for anxiety attacks  MDD in partial remission Prozac as prescribed Discontinue Abilify for noncompliance and side effects of weight gain.  Bereavement-improving She will continue to follow-up with [facility] at  [facility].  Insomnia-restless She will continue to work on sleep hygiene techniques.  Sleep is restless due to having to breast-feed and take care of her baby.  Follow-up in clinic in 2 months or sooner if  needed.  April 13 at 2 PM  I have spent atleast 20 minutes non face to face with patient today. More than 50 % of the time was spent for ordering medications and test ,psychoeducation and supportive psychotherapy and care coordination,as well as documenting clinical information in electronic health record. This note was generated in part or whole with voice recognition software. Voice recognition is usually quite accurate but there are transcription errors that can and very often do occur. I apologize for any typographical errors that were not detected and corrected.       Ursula Alert, MD 12/06/2019, 10:57 AM

## 2019-12-10 ENCOUNTER — Encounter: Payer: Self-pay | Admitting: Family

## 2019-12-19 ENCOUNTER — Ambulatory Visit: Payer: BC Managed Care – PPO | Attending: Internal Medicine

## 2019-12-19 DIAGNOSIS — Z23 Encounter for immunization: Secondary | ICD-10-CM | POA: Insufficient documentation

## 2019-12-19 NOTE — Progress Notes (Signed)
   Covid-19 Vaccination Clinic  Name:  LILYANN GRAVELLE    MRN: [id number] DOB: 1985-12-16  12/19/2019  Ms. Flott was observed post Covid-19 immunization for 15 minutes without incident. She was provided with Vaccine Information Sheet and instruction to access the V-Safe system.   Ms. Krolikowski was instructed to call 911 with any severe reactions post vaccine: Marland Kitchen Difficulty breathing  . Swelling of face and throat  . A fast heartbeat  . A bad rash all over body  . Dizziness and weakness   Immunizations Administered    Name Date Dose VIS Date Route   Pfizer COVID-19 Vaccine 12/19/2019  8:41 AM 0.3 mL 09/27/2019 Intramuscular   Manufacturer: [employer], [employer]   Lot: [id number]   NDC: [address]-7

## 2020-01-08 ENCOUNTER — Ambulatory Visit: Payer: BC Managed Care – PPO | Attending: Internal Medicine

## 2020-01-08 DIAGNOSIS — Z23 Encounter for immunization: Secondary | ICD-10-CM

## 2020-01-08 NOTE — Progress Notes (Signed)
   Covid-19 Vaccination Clinic  Name:  Andrea Roberson    MRN: [id number] DOB: [birth year]  01/08/2020  Andrea Roberson was observed post Covid-19 immunization for 15 minutes without incident. She was provided with Vaccine Information Sheet and instruction to access the V-Safe system.   Andrea Roberson was instructed to call 911 with any severe reactions post vaccine: Marland Kitchen Difficulty breathing  . Swelling of face and throat  . A fast heartbeat  . A bad rash all over body  . Dizziness and weakness   Immunizations Administered    Name Date Dose VIS Date Route   Pfizer COVID-19 Vaccine 01/08/2020 10:49 AM 0.3 mL 09/27/2019 Intramuscular   Manufacturer: [employer], [employer]   Lot: [id number]   NDC: [address]-6

## 2020-01-09 ENCOUNTER — Ambulatory Visit: Payer: BC Managed Care – PPO

## 2020-01-28 ENCOUNTER — Ambulatory Visit (INDEPENDENT_AMBULATORY_CARE_PROVIDER_SITE_OTHER): Payer: BC Managed Care – PPO

## 2020-01-28 ENCOUNTER — Other Ambulatory Visit: Payer: Self-pay

## 2020-01-28 ENCOUNTER — Ambulatory Visit (INDEPENDENT_AMBULATORY_CARE_PROVIDER_SITE_OTHER): Payer: BC Managed Care – PPO | Admitting: Psychiatry

## 2020-01-28 ENCOUNTER — Ambulatory Visit: Payer: BC Managed Care – PPO | Admitting: Podiatry

## 2020-01-28 ENCOUNTER — Encounter: Payer: Self-pay | Admitting: Psychiatry

## 2020-01-28 DIAGNOSIS — F411 Generalized anxiety disorder: Secondary | ICD-10-CM | POA: Diagnosis not present

## 2020-01-28 DIAGNOSIS — F330 Major depressive disorder, recurrent, mild: Secondary | ICD-10-CM | POA: Diagnosis not present

## 2020-01-28 DIAGNOSIS — F3341 Major depressive disorder, recurrent, in partial remission: Secondary | ICD-10-CM | POA: Insufficient documentation

## 2020-01-28 DIAGNOSIS — M25572 Pain in left ankle and joints of left foot: Secondary | ICD-10-CM

## 2020-01-28 DIAGNOSIS — G4701 Insomnia due to medical condition: Secondary | ICD-10-CM

## 2020-01-28 DIAGNOSIS — M7752 Other enthesopathy of left foot: Secondary | ICD-10-CM

## 2020-01-28 DIAGNOSIS — Q6689 Other  specified congenital deformities of feet: Secondary | ICD-10-CM

## 2020-01-28 MED ORDER — BUSPIRONE HCL 15 MG PO TABS: 15.0000 mg | ORAL_TABLET | Freq: Two times a day (BID) | ORAL | 1 refills | Status: DC

## 2020-01-28 MED ORDER — FLUOXETINE HCL 40 MG PO CAPS: ORAL_CAPSULE | ORAL | 0 refills | Status: DC

## 2020-01-28 NOTE — Patient Instructions (Signed)
Follow-up in clinic May 7 at 10:20 AM

## 2020-01-28 NOTE — Progress Notes (Signed)
Provider Location : [facility] Patient Location : Home  Virtual Visit via Video Note  I connected with Andrea Roberson on 01/28/20 at  2:00 PM EDT by a video enabled telemedicine application and verified that I am speaking with the correct person using two identifiers.   I discussed the limitations of evaluation and management by telemedicine and the availability of in person appointments. The patient expressed understanding and agreed to proceed.    I discussed the assessment and treatment plan with the patient. The patient was provided an opportunity to ask questions and all were answered. The patient agreed with the plan and demonstrated an understanding of the instructions.   The patient was advised to call back or seek an in-person evaluation if the symptoms worsen or if the condition fails to improve as anticipated.   BH MD OP Progress Note  01/28/2020 2:34 PM Andrea Roberson  MRN:  [id number]  Chief Complaint:  Chief Complaint    Follow-up     HPI: Andrea Roberson is a 34 year old female, married, lives in [place] has a history of GAD, MDD, hyperlipidemia, insomnia was evaluated by telemedicine today.  Patient today reports she is currently struggling with depression and anxiety symptoms.  She describes her anxiety as feeling nervous and restless often.  This has been getting worse since the past few weeks.  She also feels sad often.  This is getting worse since the past 2 weeks or so.  She reports sleep as good.  She has noticed increased appetite.  She however has started watching her diet and is planning to start exercising again.  Her husband has been supportive.  Patient reports work as going well.  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  Patient denies any suicidality, homicidality or perceptual disturbances.  Patient denies any other concerns today. Visit Diagnosis:    ICD-10-CM   1. Generalized anxiety disorder  F41.1 busPIRone (BUSPAR) 15 MG tablet    FLUoxetine (PROZAC)  40 MG capsule  2. MDD (major depressive disorder), recurrent episode, mild (HCC)  F33.0 busPIRone (BUSPAR) 15 MG tablet  3. Insomnia due to medical condition  G47.01     Past Psychiatric History: I have reviewed past psychiatric history from my progress note on 05/15/2019.  Past trials of Prozac, trazodone, BuSpar, Zoloft  Past Medical History:  Past Medical History:  Diagnosis Date  . Anxiety   . Asthma   . Bipolar disorder (HCC)   . Depression   . Frequent headaches   . GERD (gastroesophageal reflux disease)   . Gestational diabetes   . History of gestational diabetes 2016  . Hyperlipidemia   . Migraines   . Obesity (BMI 35.0-39.9 without comorbidity) 12/24/2015  . Tarsal coalition of left foot   . UTI (urinary tract infection)   . Wears contact lenses     Past Surgical History:  Procedure Laterality Date  . CESAREAN SECTION  2015  . CESAREAN SECTION WITH BILATERAL TUBAL LIGATION N/A 11/19/2018   Procedure: CESAREAN SECTION WITH BILATERAL TUBAL LIGATION;  Surgeon: Hildred Laser, MD;  Location: [facility];  Service: Obstetrics;  Laterality: N/A;  TOB 0822 APGAR 8/8    . ESOPHAGOGASTRODUODENOSCOPY (EGD) WITH PROPOFOL N/A 08/30/2017   Procedure: ESOPHAGOGASTRODUODENOSCOPY (EGD) WITH PROPOFOL;  Surgeon: Toney Reil, MD;  Location: [facility];  Service: Endoscopy;  Laterality: N/A;  . WISDOM TOOTH EXTRACTION      Family Psychiatric History: I have reviewed family psychiatric history from my progress note on 05/15/2019  Family History:  Family History  Problem Relation Age of Onset  . Heart disease Mother   . Diabetes Mother   . Anxiety disorder Mother   . Arthritis Mother   . Skin cancer Mother   . Diabetes Father   . Skin cancer Father   . Arthritis Father   . Colon polyps Father   . Peptic Ulcer Disease Father   . Colon polyps Brother   . Colon cancer Paternal Aunt   . Heart disease Maternal Grandmother   . Colon cancer Paternal Grandfather      Social History: I have reviewed social history from my progress note on 05/15/2019 Social History   Socioeconomic History  . Marital status: Married    Spouse name: Not on file  . Number of children: Not on file  . Years of education: Not on file  . Highest education level: Not on file  Occupational History  . Not on file  Tobacco Use  . Smoking status: Former Smoker    Packs/day: 1.50    Years: 8.00    Pack years: 12.00    Types: Cigarettes    Quit date: 09/16/2012    Years since quitting: 7.3  . Smokeless tobacco: Never Used  Substance and Sexual Activity  . Alcohol use: Yes    Comment: 2 glasswine/month  . Drug use: No  . Sexual activity: Yes    Birth control/protection: None  Other Topics Concern  . Not on file  Social History Narrative   Married   5 yo son , 5 month daughter   Runner, broadcasting/film/video- pre K      Social Determinants of Health   Financial Resource Strain:   . Difficulty of Paying Living Expenses:   Food Insecurity:   . Worried About Programme researcher, broadcasting/film/video in the Last Year:   . Barista in the Last Year:   Transportation Needs:   . Freight forwarder (Medical):   Marland Kitchen Lack of Transportation (Non-Medical):   Physical Activity:   . Days of Exercise per Week:   . Minutes of Exercise per Session:   Stress:   . Feeling of Stress :   Social Connections:   . Frequency of Communication with Friends and Family:   . Frequency of Social Gatherings with Friends and Family:   . Attends Religious Services:   . Active Member of Clubs or Organizations:   . Attends Banker Meetings:   Marland Kitchen Marital Status:     Allergies: No Known Allergies  Metabolic Disorder Labs: Lab Results  Component Value Date   HGBA1C 5.6 04/04/2019   No results found for: PROLACTIN Lab Results  Component Value Date   CHOL 238 (H) 04/04/2019   TRIG 360 (H) 04/04/2019   HDL 40 04/04/2019   CHOLHDL 6.0 (H) 04/04/2019   VLDL 34.8 02/19/2018   LDLCALC 126 (H) 04/04/2019    LDLCALC 113 (H) 02/19/2018   Lab Results  Component Value Date   TSH 0.90 05/29/2019   TSH 1.180 05/08/2018    Therapeutic Level Labs: No results found for: LITHIUM No results found for: VALPROATE No components found for:  CBMZ  Current Medications: Current Outpatient Medications  Medication Sig Dispense Refill  . busPIRone (BUSPAR) 15 MG tablet Take 1 tablet (15 mg total) by mouth 2 (two) times daily. 60 tablet 1  . cetirizine (ZYRTEC) 10 MG tablet Take 10 mg by mouth daily as needed for allergies.     Marland Kitchen FLUoxetine (PROZAC) 40 MG capsule TAKE  2 CAPSULES(80 MG) BY MOUTH DAILY 180 capsule 0  . fluticasone (FLONASE) 50 MCG/ACT nasal spray Place 2 sprays into both nostrils daily as needed for allergies.     . hydrOXYzine (ATARAX/VISTARIL) 25 MG tablet Take 0.5-1 tablets (12.5-25 mg total) by mouth 2 (two) times daily as needed. For anxiety, sleep 60 tablet 1  . meloxicam (MOBIC) 15 MG tablet Take 1 tablet (15 mg total) by mouth daily. 30 tablet 1   No current facility-administered medications for this visit.     Musculoskeletal: Strength & Muscle Tone: UTA Gait & Station: normal Patient leans: N/A  Psychiatric Specialty Exam: Review of Systems  Psychiatric/Behavioral: Positive for dysphoric mood. The patient is nervous/anxious.   All other systems reviewed and are negative.   currently breastfeeding.There is no height or weight on file to calculate BMI.  General Appearance: Casual  Eye Contact:  Fair  Speech:  Clear and Coherent  Volume:  Normal  Mood:  Anxious and Depressed  Affect:  Congruent  Thought Process:  Goal Directed and Descriptions of Associations: Intact  Orientation:  Full (Time, Place, and Person)  Thought Content: Logical   Suicidal Thoughts:  No  Homicidal Thoughts:  No  Memory:  Immediate;   Fair Recent;   Fair Remote;   Fair  Judgement:  Fair  Insight:  Fair  Psychomotor Activity:  Normal  Concentration:  Concentration: Fair and Attention  Span: Fair  Recall:  [employer] of Knowledge: Fair  Language: Fair  Akathisia:  No  Handed:  Right  AIMS (if indicated): UTA  Assets:  Communication Skills Desire for Improvement Housing Social Support  ADL's:  Intact  Cognition: WNL  Sleep:  restless since she has a baby   Screenings: GAD-7     Office Visit from 04/04/2019 in [facility]  Total GAD-7 Score  19    PHQ2-9     Office Visit from 06/19/2019 in [facility] Office Visit from 04/24/2019 in [facility] [place] Office Visit from 04/04/2019 in [facility] Nutrition from 09/21/2018 in [facility] [place] Nutrition from 05/04/2018 in [facility] [place]  PHQ-2 Total Score  3  5  4  2   0  PHQ-9 Total Score  12  18  16  5   --       Assessment and Plan: Tiegan is a 34 year old female, has a history of GAD, insomnia, hyperlipidemia was evaluated by telemedicine today.  Patient is biologically predisposed given her family history.  She also has psychosocial stressors of recent death of her father, having a newborn, health problems of family member, job related stressors and the pandemic.  Patient is currently struggling with depression and anxiety symptoms and will benefit from the following medication changes.  Plan as noted below.  Plan GAD-unstable Prozac 80 mg p.o. daily Add BuSpar 15 mg p.o. twice daily.  Patient has tried BuSpar in the past however does not remember why she stopped taking it.  This was in 2018. Hydroxyzine 25 mg p.o. nightly as needed for anxiety attacks  MDD-unstable Prozac as prescribed Add BuSpar 15 mg p.o. twice daily Discussed with patient breast-feeding implications of being on BuSpar.  She will monitor closely.  Bereavement-improving She will continue to follow-up with therapist at [facility].  Insomnia-restless She will continue to work on sleep hygiene techniques.  She does have a baby who needs her assistance at  night.  She continues to breast-feed her baby however is currently weaning off  and tested only at nighttime.  Follow-up in clinic in 3 to 4 weeks or sooner if needed.  I have spent atleast 20 minutes non  face to face with patient today. More than 50 % of the time was spent for preparing to see the patient ( e.g., review of test, records ),ordering medications and test ,psychoeducation and supportive psychotherapy and care coordination,as well as documenting clinical information in electronic health record.This note was generated in part or whole with voice recognition software. Voice recognition is usually quite accurate but there are transcription errors that can and very often do occur. I apologize for any typographical errors that were not detected and corrected.       Ursula Alert, MD 01/28/2020, 2:34 PM

## 2020-01-30 ENCOUNTER — Other Ambulatory Visit: Payer: Self-pay | Admitting: Podiatry

## 2020-01-30 DIAGNOSIS — M25572 Pain in left ankle and joints of left foot: Secondary | ICD-10-CM

## 2020-02-04 NOTE — Progress Notes (Signed)
   HPI: 34 year old female presenting today for follow up evaluation of left foot and ankle pain. She reports constant, sharp, burning pain. She reports associated swelling. She has been taking Meloxicam and Ibuprofen for treatment. Being on the foot increases the pain.  She also reports sharp to the left great toe that began 3 weeks ago. She reports associated cramping and swelling of the toe. Walking increases the pain. She has been taking Ibuprofen, icing the area and soaking the toe in Epsom salt for treatment. Patient is here for further evaluation and treatment.   Past Medical History:  Diagnosis Date  . Anxiety   . Asthma   . Bipolar disorder (HCC)   . Depression   . Frequent headaches   . GERD (gastroesophageal reflux disease)   . Gestational diabetes   . History of gestational diabetes 2016  . Hyperlipidemia   . Migraines   . Obesity (BMI 35.0-39.9 without comorbidity) 12/24/2015  . Tarsal coalition of left foot   . UTI (urinary tract infection)   . Wears contact lenses      Physical Exam: General: The patient is alert and oriented x3 in no acute distress.  Dermatology: Skin is warm, dry and supple bilateral lower extremities. Negative for open lesions or macerations.  Vascular: Palpable pedal pulses bilaterally. No edema or erythema noted. Capillary refill within normal limits.  Neurological: Epicritic and protective threshold grossly intact bilaterally.   Musculoskeletal Exam: Limited ROM with inversion and eversion of the subtalar joint. Muscle strength 5/5 in all groups bilateral.  There is some pain with inversion and eversion as well as pain on palpation of the sinus tarsi left. Pain with palpation noted to the 1st MPJ of the left hallux.   Assessment: 1. Tarsal coalition left 2. Sinus tarsitis left 3. 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 Patient evaluated.  2. Injection of 0.5 mLs Celestone Soluspan injected into the left sinus tarsi.  3. Injection of  0.5 mLs Celestone Soluspan injected into the 1st MPJ of the left foot.  4. Continue taking Meloxicam as needed.  5. CAM boot dispensed. Weightbearing as tolerated.  6. Return to clinic as needed.   School Runner, broadcasting/film/video.  Had a baby girl in February 2020.  She also has a son approximately 46 years old     Felecia Shelling, DPM [facility]  Dr. Felecia Shelling, DPM    [address] [place], [place] [address]                Office [phone]  Fax [phone]

## 2020-02-21 ENCOUNTER — Other Ambulatory Visit: Payer: Self-pay

## 2020-02-21 ENCOUNTER — Telehealth (INDEPENDENT_AMBULATORY_CARE_PROVIDER_SITE_OTHER): Payer: BC Managed Care – PPO | Admitting: Psychiatry

## 2020-02-21 ENCOUNTER — Encounter: Payer: Self-pay | Admitting: Psychiatry

## 2020-02-21 DIAGNOSIS — F411 Generalized anxiety disorder: Secondary | ICD-10-CM

## 2020-02-21 DIAGNOSIS — G4701 Insomnia due to medical condition: Secondary | ICD-10-CM

## 2020-02-21 DIAGNOSIS — F3341 Major depressive disorder, recurrent, in partial remission: Secondary | ICD-10-CM | POA: Diagnosis not present

## 2020-02-21 NOTE — Progress Notes (Signed)
Provider Location : [facility] Patient Location : Work  Virtual Visit via Video Note  I connected with Andrea Roberson on 02/21/20 at 10:20 AM EDT by a video enabled telemedicine application and verified that I am speaking with the correct person using two identifiers.   I discussed the limitations of evaluation and management by telemedicine and the availability of in person appointments. The patient expressed understanding and agreed to proceed.  I discussed the assessment and treatment plan with the patient. The patient was provided an opportunity to ask questions and all were answered. The patient agreed with the plan and demonstrated an understanding of the instructions.   The patient was advised to call back or seek an in-person evaluation if the symptoms worsen or if the condition fails to improve as anticipated.  BH MD OP Progress Note  02/21/2020 10:57 AM Andrea Roberson  MRN:  [id number]  Chief Complaint:  Chief Complaint    Follow-up     HPI: Andrea Roberson is a 34 year old female, married, lives in [place], has a history of GAD, MDD, hyperlipidemia, insomnia was evaluated by telemedicine today.  Patient today reports she is currently making progress with regards to her depression and anxiety symptoms.  She reports she is compliant on her medications.  She reports since starting the BuSpar she has noticed a lot of improvement.  The BuSpar definitely helps.  She denies side effects to the BuSpar or her other medications.  She reports her baby currently sleeps through the night.  She hence reports sleep as improving.  She reports her appetite is good.  Patient denies any suicidality, homicidality or perceptual disturbances.  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  She however reports overall she is coping okay.  She looks forward to  the summer break.  She continues to have psychotherapy sessions and reports therapy sessions as beneficial.  She denies any other concerns today. Visit Diagnosis:    ICD-10-CM   1. Generalized anxiety disorder  F41.1   2. MDD (major depressive disorder), recurrent, in partial remission (HCC)  F33.41   3. Insomnia due to medical condition  G47.01     Past Psychiatric History: I have reviewed past psychiatric history from my progress note on 05/15/2019.  Past trials of Prozac, trazodone, BuSpar, Zoloft.  Past Medical History:  Past Medical History:  Diagnosis Date  . Anxiety   . Asthma   . Bipolar disorder (HCC)   . Depression   . Frequent headaches   . GERD (gastroesophageal reflux disease)   . Gestational diabetes   . History of gestational diabetes 2016  . Hyperlipidemia   . Migraines   . Obesity (BMI 35.0-39.9 without comorbidity) 12/24/2015  . Tarsal coalition of left foot   . UTI (urinary tract infection)   . Wears contact lenses     Past Surgical History:  Procedure Laterality Date  . CESAREAN SECTION  2015  . CESAREAN SECTION WITH BILATERAL TUBAL LIGATION N/A 11/19/2018   Procedure: CESAREAN SECTION WITH BILATERAL TUBAL LIGATION;  Surgeon: Hildred Laser, MD;  Location: [facility];  Service: Obstetrics;  Laterality: N/A;  TOB 0822 APGAR 8/8    . ESOPHAGOGASTRODUODENOSCOPY (EGD) WITH PROPOFOL N/A 08/30/2017   Procedure: ESOPHAGOGASTRODUODENOSCOPY (EGD) WITH PROPOFOL;  Surgeon: Toney Reil, MD;  Location: [facility];  Service: Endoscopy;  Laterality: N/A;  .  WISDOM TOOTH EXTRACTION      Family Psychiatric History: I have reviewed family psychiatric history from my progress note on 05/15/2019.  Family History:  Family History  Problem Relation Age of Onset  . Heart disease Mother   . Diabetes Mother   . Anxiety disorder Mother   . Arthritis Mother   . Skin cancer Mother   . Diabetes Father   . Skin cancer Father   . Arthritis Father   . Colon  polyps Father   . Peptic Ulcer Disease Father   . Colon polyps Brother   . Colon cancer Paternal Aunt   . Heart disease Maternal Grandmother   . Colon cancer Paternal Grandfather     Social History: I have reviewed social history from my progress note on 05/15/2019. Social History   Socioeconomic History  . Marital status: Married    Spouse name: Not on file  . Number of children: Not on file  . Years of education: Not on file  . Highest education level: Not on file  Occupational History  . Not on file  Tobacco Use  . Smoking status: Former Smoker    Packs/day: 1.50    Years: 8.00    Pack years: 12.00    Types: Cigarettes    Quit date: 09/16/2012    Years since quitting: 7.4  . Smokeless tobacco: Never Used  Substance and Sexual Activity  . Alcohol use: Yes    Comment: 2 glasswine/month  . Drug use: No  . Sexual activity: Yes    Birth control/protection: None  Other Topics Concern  . Not on file  Social History Narrative   Married   5 yo son , 5 month daughter   Runner, broadcasting/film/video- pre K      Social Determinants of Health   Financial Resource Strain:   . Difficulty of Paying Living Expenses:   Food Insecurity:   . Worried About Programme researcher, broadcasting/film/video in the Last Year:   . Barista in the Last Year:   Transportation Needs:   . Freight forwarder (Medical):   Marland Kitchen Lack of Transportation (Non-Medical):   Physical Activity:   . Days of Exercise per Week:   . Minutes of Exercise per Session:   Stress:   . Feeling of Stress :   Social Connections:   . Frequency of Communication with Friends and Family:   . Frequency of Social Gatherings with Friends and Family:   . Attends Religious Services:   . Active Member of Clubs or Organizations:   . Attends Banker Meetings:   Marland Kitchen Marital Status:     Allergies: No Known Allergies  Metabolic Disorder Labs: Lab Results  Component Value Date   HGBA1C 5.6 04/04/2019   No results found for: PROLACTIN Lab  Results  Component Value Date   CHOL 238 (H) 04/04/2019   TRIG 360 (H) 04/04/2019   HDL 40 04/04/2019   CHOLHDL 6.0 (H) 04/04/2019   VLDL 34.8 02/19/2018   LDLCALC 126 (H) 04/04/2019   LDLCALC 113 (H) 02/19/2018   Lab Results  Component Value Date   TSH 0.90 05/29/2019   TSH 1.180 05/08/2018    Therapeutic Level Labs: No results found for: LITHIUM No results found for: VALPROATE No components found for:  CBMZ  Current Medications: Current Outpatient Medications  Medication Sig Dispense Refill  . busPIRone (BUSPAR) 15 MG tablet Take 1 tablet (15 mg total) by mouth 2 (two) times daily. 60 tablet 1  .  cetirizine (ZYRTEC) 10 MG tablet Take 10 mg by mouth daily as needed for allergies.     Marland Kitchen FLUoxetine (PROZAC) 40 MG capsule TAKE 2 CAPSULES(80 MG) BY MOUTH DAILY 180 capsule 0  . fluticasone (FLONASE) 50 MCG/ACT nasal spray Place 2 sprays into both nostrils daily as needed for allergies.     . hydrOXYzine (ATARAX/VISTARIL) 25 MG tablet Take 0.5-1 tablets (12.5-25 mg total) by mouth 2 (two) times daily as needed. For anxiety, sleep 60 tablet 1  . meloxicam (MOBIC) 15 MG tablet Take 1 tablet (15 mg total) by mouth daily. 30 tablet 1   No current facility-administered medications for this visit.     Musculoskeletal: Strength & Muscle Tone: UTA Gait & Station: normal Patient leans: N/A  Psychiatric Specialty Exam: Review of Systems  Psychiatric/Behavioral: Negative for agitation, behavioral problems, confusion, decreased concentration, dysphoric mood, hallucinations, self-injury and sleep disturbance. The patient is not nervous/anxious and is not hyperactive.   All other systems reviewed and are negative.   currently breastfeeding.There is no height or weight on file to calculate BMI.  General Appearance: Casual  Eye Contact:  Fair  Speech:  Normal Rate  Volume:  Normal  Mood:  Euthymic  Affect:  Congruent  Thought Process:  Goal Directed and Descriptions of Associations:  Intact  Orientation:  Full (Time, Place, and Person)  Thought Content: Logical   Suicidal Thoughts:  No  Homicidal Thoughts:  No  Memory:  Immediate;   Fair Recent;   Fair Remote;   Fair  Judgement:  Fair  Insight:  Fair  Psychomotor Activity:  Normal  Concentration:  Concentration: Fair and Attention Span: Fair  Recall:  [employer] of Knowledge: Fair  Language: Fair  Akathisia:  No  Handed:  Right  AIMS (if indicated): UTA  Assets:  Communication Skills Desire for Improvement Housing Social Support  ADL's:  Intact  Cognition: WNL  Sleep:  Fair   Screenings: GAD-7     Office Visit from 04/04/2019 in [facility]  Total GAD-7 Score  19    PHQ2-9     Office Visit from 06/19/2019 in [facility] Office Visit from 04/24/2019 in [facility] Office Visit from 04/04/2019 in [facility] Nutrition from 09/21/2018 in [facility] [place] Nutrition from 05/04/2018 in [facility] [place]  PHQ-2 Total Score  3  5  4  2   0  PHQ-9 Total Score  12  18  16  5   --       Assessment and Plan: Andrea Roberson is a 34 year old female, has a history of GAD, insomnia, hyperlipidemia was evaluated by telemedicine today.  Patient is biologically predisposed given her family history.  She also has psychosocial stressors of recent death of her father, having a newborn, mother's early dementia and recent fall, job related stressors and the pandemic.  Patient however is currently making progress on the current medication regimen as well as psychotherapy sessions.  Plan as noted below.  Plan GAD-improving Prozac 80 mg p.o. daily BuSpar 15 mg p.o. twice daily. Hydroxyzine 25 mg p.o. nightly as needed for anxiety attacks  MDD-in partial remission Prozac as prescribed BuSpar 15 mg p.o. twice daily. Patient is aware about the breast-feeding implications of being on BuSpar.  She is currently weaning her baby off of  breast-feeding. She will continue to monitor her closely.  Bereavement-improving She will continue to follow-up with her therapist at [facility].  Insomnia-improving She will continue to follow good  sleep hygiene technique.  Follow-up in clinic in 3 months or sooner if needed.  I have spent atleast 20 minutes non face to face with patient today. More than 50 % of the time was spent for preparing to see the patient ( e.g., review of test, records ),  ordering medications and test ,psychoeducation and supportive psychotherapy and care coordination,as well as documenting clinical information in electronic health record. This note was generated in part or whole with voice recognition software. Voice recognition is usually quite accurate but there are transcription errors that can and very often do occur. I apologize for any typographical errors that were not detected and corrected.       Ursula Alert, MD 02/21/2020, 10:57 AM

## 2020-02-29 ENCOUNTER — Other Ambulatory Visit: Payer: Self-pay | Admitting: Psychiatry

## 2020-02-29 DIAGNOSIS — F411 Generalized anxiety disorder: Secondary | ICD-10-CM

## 2020-04-03 ENCOUNTER — Other Ambulatory Visit: Payer: Self-pay | Admitting: Psychiatry

## 2020-04-03 DIAGNOSIS — F330 Major depressive disorder, recurrent, mild: Secondary | ICD-10-CM

## 2020-04-03 DIAGNOSIS — F411 Generalized anxiety disorder: Secondary | ICD-10-CM

## 2020-04-08 ENCOUNTER — Ambulatory Visit (INDEPENDENT_AMBULATORY_CARE_PROVIDER_SITE_OTHER): Payer: BC Managed Care – PPO | Admitting: Obstetrics and Gynecology

## 2020-04-08 ENCOUNTER — Encounter: Payer: Self-pay | Admitting: Obstetrics and Gynecology

## 2020-04-08 ENCOUNTER — Other Ambulatory Visit: Payer: Self-pay

## 2020-04-08 ENCOUNTER — Other Ambulatory Visit (HOSPITAL_COMMUNITY)
Admission: RE | Admit: 2020-04-08 | Discharge: 2020-04-08 | Disposition: A | Discharge status: Home / Self Care | Payer: BC Managed Care – PPO | Source: Ambulatory Visit | Attending: Obstetrics and Gynecology | Admitting: Obstetrics and Gynecology

## 2020-04-08 VITALS — BP 121/79 | HR 72 | Ht 65.0 in | Wt 254.7 lb

## 2020-04-08 DIAGNOSIS — Z01419 Encounter for gynecological examination (general) (routine) without abnormal findings: Secondary | ICD-10-CM | POA: Insufficient documentation

## 2020-04-08 DIAGNOSIS — Z124 Encounter for screening for malignant neoplasm of cervix: Secondary | ICD-10-CM | POA: Diagnosis present

## 2020-04-08 DIAGNOSIS — F3181 Bipolar II disorder: Secondary | ICD-10-CM

## 2020-04-08 DIAGNOSIS — E785 Hyperlipidemia, unspecified: Secondary | ICD-10-CM

## 2020-04-08 DIAGNOSIS — N941 Unspecified dyspareunia: Secondary | ICD-10-CM

## 2020-04-08 DIAGNOSIS — Z8632 Personal history of gestational diabetes: Secondary | ICD-10-CM | POA: Diagnosis not present

## 2020-04-08 DIAGNOSIS — F520 Hypoactive sexual desire disorder: Secondary | ICD-10-CM

## 2020-04-08 DIAGNOSIS — E669 Obesity, unspecified: Secondary | ICD-10-CM | POA: Diagnosis not present

## 2020-04-08 MED ORDER — TESTOSTERONE CYPIONATE 200 MG/ML IM SOLN: 60.0000 mg | INTRAMUSCULAR | 0 refills | Status: DC

## 2020-04-08 NOTE — Progress Notes (Addendum)
GYNECOLOGY ANNUAL PHYSICAL EXAM PROGRESS NOTE  Subjective:    Andrea Roberson is a 34 y.o. P37 married female who presents for an annual exam.The patient is sexually active.  The patient wears seatbelts: yes. The patient participates in regular exercise: yes (2-3 times weekly, jog/walking 2 miles). Has the patient ever been transfused or tattooed?: no. The patient reports that there is not domestic violence in her life.   The patient has the following complaints today:  1. Still with decreased libido - noted this prior to the birth of her last child last year, however notes that that it has gotten a little worse. Feels that she has no sex drive. Engages in sex at the request of her husband but has difficulty experiencing pleasure at times. Also notes that sex is uncomfortable (mostly after sexual act is completed, she notes vaginal dryness, and painful cramping).  Has tried use of lubricants and different sexual positions however this has not helped.  2. Weight loss - patient notes that she has been trying to lose weight, trying to eat healthier and now exercising fairly regularly (walking) but usually only loses a few pounds before she plateaus.  Reports use of Phentermine in the past with great results, would like to consider again, however is still breastfeeding (mostly at night).    Gynecologic History No LMP recorded.   Menarche age: 63.  Reports regular menses.  Contraception: tubal ligation  History of STI's: Denies Last Pap: 04/2014. Results were: normal.  Denies h/o abnormal pap smears.   OB History  Gravida Para Term Preterm AB Living  2 2 2  0 0 2  SAB TAB Ectopic Multiple Live Births  0 0 0 0 2    # Outcome Date GA Lbr Len/2nd Weight Sex Delivery Anes PTL Lv  2 Term 11/19/18 [redacted]w[redacted]d  7 lb 6.9 oz (3.37 kg) F CS-LTranv Spinal  LIV     Name: Andrea Roberson     Apgar1: 8  Apgar5: 8  1 Term 05/02/14 [redacted]w[redacted]d  7 lb 8 oz (3.402 kg) M CS-LTranv Spinal, EPI N LIV      Complications: Gestational diabetes mellitus in childbirth, insulin controlled, Excessive weight gain in pregnancy     Name: Andrea Roberson     Apgar1: 8  Apgar5: 9    Past Medical History:  Diagnosis Date  . Anxiety   . Asthma   . Bipolar disorder (HCC)   . Depression   . Frequent headaches   . GERD (gastroesophageal reflux disease)   . Gestational diabetes   . History of gestational diabetes 2016  . Hyperlipidemia   . Migraines   . Obesity (BMI 35.0-39.9 without comorbidity) 12/24/2015  . Tarsal coalition of left foot   . TMJ (dislocation of temporomandibular joint)   . UTI (urinary tract infection)   . Wears contact lenses      Past Surgical History:  Procedure Laterality Date  . CESAREAN SECTION  2015  . CESAREAN SECTION WITH BILATERAL TUBAL LIGATION N/A 11/19/2018   Procedure: CESAREAN SECTION WITH BILATERAL TUBAL LIGATION;  Surgeon: 01/18/2019, MD;  Location: [facility];  Service: Obstetrics;  Laterality: N/A;  TOB 0822 APGAR 8/8    . ESOPHAGOGASTRODUODENOSCOPY (EGD) WITH PROPOFOL N/A 08/30/2017   Procedure: ESOPHAGOGASTRODUODENOSCOPY (EGD) WITH PROPOFOL;  Surgeon: 09/01/2017, MD;  Location: [facility];  Service: Endoscopy;  Laterality: N/A;  . WISDOM TOOTH EXTRACTION      Family History  Problem Relation Age of Onset  .  Heart disease Mother   . Diabetes Mother   . Anxiety disorder Mother   . Arthritis Mother   . Skin cancer Mother   . Diabetes Father   . Skin cancer Father   . Arthritis Father   . Colon polyps Father   . Peptic Ulcer Disease Father   . Colon polyps Brother   . Colon cancer Paternal Aunt   . Heart disease Maternal Grandmother   . Colon cancer Paternal Grandfather     Social History   Socioeconomic History  . Marital status: Married    Spouse name: Not on file  . Number of children: Not on file  . Years of education: Not on file  . Highest education level: Not on file  Occupational History  . Not on file  Tobacco Use  .  Smoking status: Former Smoker    Packs/day: 1.50    Years: 8.00    Pack years: 12.00    Types: Cigarettes    Quit date: 09/16/2012    Years since quitting: 7.5  . Smokeless tobacco: Never Used  Vaping Use  . Vaping Use: Former  Substance and Sexual Activity  . Alcohol use: Not Currently    Comment: 2 glasswine/month  . Drug use: No  . Sexual activity: Yes    Birth control/protection: None  Other Topics Concern  . Not on file  Social History Narrative   Married   5 yo son , 5 month daughter   Runner, broadcasting/film/video- pre K      Social Determinants of Health   Financial Resource Strain:   . Difficulty of Paying Living Expenses:   Food Insecurity:   . Worried About Programme researcher, broadcasting/film/video in the Last Year:   . Barista in the Last Year:   Transportation Needs:   . Freight forwarder (Medical):   Marland Kitchen Lack of Transportation (Non-Medical):   Physical Activity:   . Days of Exercise per Week:   . Minutes of Exercise per Session:   Stress:   . Feeling of Stress :   Social Connections:   . Frequency of Communication with Friends and Family:   . Frequency of Social Gatherings with Friends and Family:   . Attends Religious Services:   . Active Member of Clubs or Organizations:   . Attends Banker Meetings:   Marland Kitchen Marital Status:   Intimate Partner Violence:   . Fear of Current or Ex-Partner:   . Emotionally Abused:   Marland Kitchen Physically Abused:   . Sexually Abused:     Current Outpatient Medications on File Prior to Visit  Medication Sig Dispense Refill  . busPIRone (BUSPAR) 15 MG tablet TAKE 1 TABLET(15 MG) BY MOUTH TWICE DAILY 60 tablet 1  . cetirizine (ZYRTEC) 10 MG tablet Take 10 mg by mouth daily as needed for allergies.     . cyclobenzaprine (FLEXERIL) 5 MG tablet Take 5 mg by mouth 3 (three) times daily as needed for muscle spasms.    Marland Kitchen FLUoxetine (PROZAC) 40 MG capsule TAKE 2 CAPSULES(80 MG) BY MOUTH DAILY 180 capsule 0  . fluticasone (FLONASE) 50 MCG/ACT nasal spray  Place 2 sprays into both nostrils daily as needed for allergies.     . hydrOXYzine (ATARAX/VISTARIL) 25 MG tablet Take 0.5-1 tablets (12.5-25 mg total) by mouth 2 (two) times daily as needed. For anxiety, sleep 60 tablet 1  . meloxicam (MOBIC) 15 MG tablet Take 1 tablet (15 mg total) by mouth daily. 30 tablet 1  No current facility-administered medications on file prior to visit.    No Known Allergies   Review of Systems Constitutional: negative for chills, fatigue, fevers and sweats.  Eyes: negative for irritation, redness and visual disturbance Ears, nose, mouth, throat, and face: negative for hearing loss, nasal congestion, snoring and tinnitus Respiratory: negative for asthma, cough, sputum Cardiovascular: negative for chest pain, dyspnea, exertional chest pressure/discomfort, irregular heart beat, palpitations and syncope Gastrointestinal: negative for abdominal pain, change in bowel habits, nausea and vomiting Genitourinary: negative for abnormal menstrual periods, genital lesions, sexual problems and vaginal discharge, dysuria and urinary incontinence.  Positive for mild to moderate dyspareunia.  Integument/breast: negative for breast lump, breast tenderness and nipple discharge Hematologic/lymphatic: negative for bleeding and easy bruising Musculoskeletal:negative for back pain and muscle weakness Neurological: negative for dizziness, headaches, vertigo and weakness Endocrine: negative for diabetic symptoms including polydipsia, polyuria and skin dryness Allergic/Immunologic: negative for hay fever and urticaria       Objective:  Blood pressure 121/79, pulse 72, height 5\' 5"  (1.651 m), weight 254 lb 11.2 oz (115.5 kg), currently breastfeeding. Body mass index is 42.38 kg/m.  General Appearance:    Alert, cooperative, no distress, appears stated age; morbidly obese  Head:    Normocephalic, without obvious abnormality, atraumatic  Eyes:    PERRL, conjunctiva/corneas clear,  EOM's intact, both eyes  Ears:    Normal external ear canals, both ears  Nose:   Nares normal, septum midline, mucosa normal, no drainage or sinus tenderness  Throat:   Lips, mucosa, and tongue normal; teeth and gums normal  Neck:   Supple, symmetrical, trachea midline, no adenopathy; thyroid: no enlargement/tenderness/nodules; no carotid bruit or JVD  Back:     Symmetric, no curvature, ROM normal, no CVA tenderness  Lungs:     Clear to auscultation bilaterally, respirations unlabored  Chest Wall:    No tenderness or deformity   Heart:    Regular rate and rhythm, S1 and S2 normal, no murmur, rub or gallop  Breast Exam:    No tenderness, masses, or nipple abnormality  Abdomen:     Soft, non-tender, bowel sounds active all four quadrants, no masses, no organomegaly.  Well-healed Pfannenstiel incision  Genitalia:    Pelvic:external genitalia normal, vagina without lesions, discharge, or tenderness, rectovaginal septum  normal. Cervix normal in appearance, no cervical motion tenderness, no adnexal masses or tenderness.  Uterus normal size, shape, mobile, regular contours, nontender.  Rectal:    Normal external sphincter.  No hemorrhoids appreciated. Internal exam not done.   Extremities:   Extremities normal, atraumatic, no cyanosis or edema  Pulses:   2+ and symmetric all extremities  Skin:   Skin color, texture, turgor normal, no rashes or lesions  Lymph nodes:   Cervical, supraclavicular, and axillary nodes normal  Neurologic:   CNII-XII intact, normal strength, sensation and reflexes throughout   Labs:  Lab Results  Component Value Date   WBC 7.7 04/04/2019   HGB 12.6 04/04/2019   HCT 38.4 04/04/2019   MCV 84 04/04/2019   PLT 295 04/04/2019    Lab Results  Component Value Date   CREATININE 0.83 04/04/2019   BUN 10 04/04/2019   NA 138 04/04/2019   K 4.5 04/04/2019   CL 100 04/04/2019   CO2 21 04/04/2019   Lab Results  Component Value Date   ALT 23 04/04/2019   AST 18  04/04/2019   ALKPHOS 146 (H) 04/04/2019   BILITOT 0.3 04/04/2019    Lab Results  Component Value Date   CHOL 238 (H) 04/04/2019   HDL 40 04/04/2019   LDLCALC 126 (H) 04/04/2019   TRIG 360 (H) 04/04/2019   CHOLHDL 6.0 (H) 04/04/2019   Lab Results  Component Value Date   TSH 0.90 05/29/2019     Assessment:    Healthy female exam.  Morbid obesity Dyslipidemia History of GDM Dyspareunia  Decreased libido Bipolar disorder  Plan:  - Labs: CBC, CMP, and Lipid panel, HgbA1c - Pap smear performed today.  - Breast self exam technique reviewed and patient encouraged to perform self-exam monthly. - Contraception: tubal ligation.  - Discussed healthy lifestyle modifications.  Notes that she has been exercising and dieting, but not making much progress. Has used Phentermine in the past.  Discussed alternative options/solutions for those with contraindications for use (patient still currently breastfeeeding). Patient can schedule a f/u appointment to discuss in more detail.  - Dyslipidemia. Would recommend low-fat/low-cholesterol diet, and exercise.  - H/o GDM. Recommend yearly screening for diabetes.  - Dyspareunia, mild to moderate, mostly with deep penetration or after intercourse.   Discussed potential causes.  Advised on NSAIDs prior to intercourse. Can also try pelvic floor physical therapy referral as patient with h/o 2 C-sections, could be some scar tissue inflammation if desired.  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  Discussed management options including use of erotica, change scenery, use of herbal remedies, testosterone injections or Vyleesi.  Also discussed option of changing current anti-depressant (Prozac) as this can lead to libido issues. Paitne notes that she is stable and happy on this medication, has tried several others in the  past but did not help.  Would like to remain on medication at this time.  Will try testosterone injections. Prescription given.  - Bipolar disorder currently managed with Prozac and Buspar. - Follow up in 1 year for annual exam.  F/u in 1 month for reassessment of libido.      Rubie Maid, MD [facility]

## 2020-04-08 NOTE — Patient Instructions (Addendum)
Preventive Care 21-34 Years Old, Female Preventive care refers to visits with your health care provider and lifestyle choices that can promote health and wellness. This includes:  A yearly physical exam. This may also be called an annual well check.  Regular dental visits and eye exams.  Immunizations.  Screening for certain conditions.  Healthy lifestyle choices, such as eating a healthy diet, getting regular exercise, not using drugs or products that contain nicotine and tobacco, and limiting alcohol use. What can I expect for my preventive care visit? Physical exam Your health care provider will check your:  Height and weight. This may be used to calculate body mass index (BMI), which tells if you are at a healthy weight.  Heart rate and blood pressure.  Skin for abnormal spots. Counseling Your health care provider may ask you questions about your:  Alcohol, tobacco, and drug use.  Emotional well-being.  Home and relationship well-being.  Sexual activity.  Eating habits.  Work and work environment.  Method of birth control.  Menstrual cycle.  Pregnancy history. What immunizations do I need?  Influenza (flu) vaccine  This is recommended every year. Tetanus, diphtheria, and pertussis (Tdap) vaccine  You may need a Td booster every 10 years. Varicella (chickenpox) vaccine  You may need this if you have not been vaccinated. Human papillomavirus (HPV) vaccine  If recommended by your health care provider, you may need three doses over 6 months. Measles, mumps, and rubella (MMR) vaccine  You may need at least one dose of MMR. You may also need a second dose. Meningococcal conjugate (MenACWY) vaccine  One dose is recommended if you are age 19-21 years and a first-year college student living in a residence hall, or if you have one of several medical conditions. You may also need additional booster doses. Pneumococcal conjugate (PCV13) vaccine  You may need  this if you have certain conditions and were not previously vaccinated. Pneumococcal polysaccharide (PPSV23) vaccine  You may need one or two doses if you smoke cigarettes or if you have certain conditions. Hepatitis A vaccine  You may need this if you have certain conditions or if you travel or work in places where you may be exposed to hepatitis A. Hepatitis B vaccine  You may need this if you have certain conditions or if you travel or work in places where you may be exposed to hepatitis B. Haemophilus influenzae type b (Hib) vaccine  You may need this if you have certain conditions. You may receive vaccines as individual doses or as more than one vaccine together in one shot (combination vaccines). Talk with your health care provider about the risks and benefits of combination vaccines. What tests do I need?  Blood tests  Lipid and cholesterol levels. These may be checked every 5 years starting at age 20.  Hepatitis C test.  Hepatitis B test. Screening  Diabetes screening. This is done by checking your blood sugar (glucose) after you have not eaten for a while (fasting).  Sexually transmitted disease (STD) testing.  BRCA-related cancer screening. This may be done if you have a family history of breast, ovarian, tubal, or peritoneal cancers.  Pelvic exam and Pap test. This may be done every 3 years starting at age 21. Starting at age 30, this may be done every 5 years if you have a Pap test in combination with an HPV test. Talk with your health care provider about your test results, treatment options, and if necessary, the need for more tests.   Follow these instructions at home: Eating and drinking   Eat a diet that includes fresh fruits and vegetables, whole grains, lean protein, and low-fat dairy.  Take vitamin and mineral supplements as recommended by your health care provider.  Do not drink alcohol if: ? Your health care provider tells you not to drink. ? You are  pregnant, may be pregnant, or are planning to become pregnant.  If you drink alcohol: ? Limit how much you have to 0-1 drink a day. ? Be aware of how much alcohol is in your drink. In the [place] one drink equals one 12 oz bottle of beer (355 mL), one 5 oz glass of wine (148 mL), or one 1 oz glass of hard liquor (44 mL). Lifestyle  Take daily care of your teeth and gums.  Stay active. Exercise for at least 30 minutes on 5 or more days each week.  Do not use any products that contain nicotine or tobacco, such as cigarettes, e-cigarettes, and chewing tobacco. If you need help quitting, ask your health care provider.  If you are sexually active, practice safe sex. Use a condom or other form of birth control (contraception) in order to prevent pregnancy and STIs (sexually transmitted infections). If you plan to become pregnant, see your health care provider for a preconception visit. What's next?  Visit your health care provider once a year for a well check visit.  Ask your health care provider how often you should have your eyes and teeth checked.  Stay up to date on all vaccines. This information is not intended to replace advice given to you by your health care provider. Make sure you discuss any questions you have with your health care provider. Document Revised: 06/14/2018 Document Reviewed: 06/14/2018 Elsevier Patient Education  2020 Elsevier Inc. Breast Self-Awareness Breast self-awareness is knowing how your breasts look and feel. Doing breast self-awareness is important. It allows you to catch a breast problem early while it is still small and can be treated. All women should do breast self-awareness, including women who have had breast implants. Tell your doctor if you notice a change in your breasts. What you need:  A mirror.  A well-lit room. How to do a breast self-exam A breast self-exam is one way to learn what is normal for your breasts and to check for changes. To do a  breast self-exam: Look for changes  1. Take off all the clothes above your waist. 2. Stand in front of a mirror in a room with good lighting. 3. Put your hands on your hips. 4. Push your hands down. 5. Look at your breasts and nipples in the mirror to see if one breast or nipple looks different from the other. Check to see if: ? The shape of one breast is different. ? The size of one breast is different. ? There are wrinkles, dips, and bumps in one breast and not the other. 6. Look at each breast for changes in the skin, such as: ? Redness. ? Scaly areas. 7. Look for changes in your nipples, such as: ? Liquid around the nipples. ? Bleeding. ? Dimpling. ? Redness. ? A change in where the nipples are. Feel for changes  1. Lie on your back on the floor. 2. Feel each breast. To do this, follow these steps: ? Pick a breast to feel. ? Put the arm closest to that breast above your head. ? Use your other arm to feel the nipple area of your breast. Feel   the area with the pads of your three middle fingers by making small circles with your fingers. For the first circle, press lightly. For the second circle, press harder. For the third circle, press even harder. ? Keep making circles with your fingers at the different pressures as you move down your breast. Stop when you feel your ribs. ? Move your fingers a little toward the center of your body. ? Start making circles with your fingers again, this time going up until you reach your collarbone. ? Keep making up-and-down circles until you reach your armpit. Remember to keep using the three pressures. ? Feel the other breast in the same way. 3. Sit or stand in the tub or shower. 4. With soapy water on your skin, feel each breast the same way you did in step 2 when you were lying on the floor. Write down what you find Writing down what you find can help you remember what to tell your doctor. Write down:  What is normal for each breast.  Any  changes you find in each breast, including: ? The kind of changes you find. ? Whether you have pain. ? Size and location of any lumps.  When you last had your menstrual period. General tips  Check your breasts every month.  If you are breastfeeding, the best time to check your breasts is after you feed your baby or after you use a breast pump.  If you get menstrual periods, the best time to check your breasts is 5-7 days after your menstrual period is over.  With time, you will become comfortable with the self-exam, and you will begin to know if there are changes in your breasts. Contact a doctor if you:  See a change in the shape or size of your breasts or nipples.  See a change in the skin of your breast or nipples, such as red or scaly skin.  Have fluid coming from your nipples that is not normal.  Find a lump or thick area that was not there before.  Have pain in your breasts.  Have any concerns about your breast health. Summary  Breast self-awareness includes looking for changes in your breasts, as well as feeling for changes within your breasts.  Breast self-awareness should be done in front of a mirror in a well-lit room.  You should check your breasts every month. If you get menstrual periods, the best time to check your breasts is 5-7 days after your menstrual period is over.  Let your doctor know of any changes you see in your breasts, including changes in size, changes on the skin, pain or tenderness, or fluid from your nipples that is not normal. This information is not intended to replace advice given to you by your health care provider. Make sure you discuss any questions you have with your health care provider. Document Revised: 05/22/2018 Document Reviewed: 05/22/2018 Elsevier Patient Education  2020 Elsevier Inc.  

## 2020-04-08 NOTE — Progress Notes (Signed)
Pt present for annual exam. Pt stated that she was doing well no problems.  

## 2020-04-09 LAB — CBC
Hematocrit: 41 % (ref 34.0–46.6)
Hemoglobin: 13.5 g/dL (ref 11.1–15.9)
MCH: 27.8 pg (ref 26.6–33.0)
MCHC: 32.9 g/dL (ref 31.5–35.7)
MCV: 84 fL (ref 79–97)
Platelets: 293 10*3/uL (ref 150–450)
RBC: 4.86 x10E6/uL (ref 3.77–5.28)
RDW: 14.2 % (ref 11.7–15.4)
WBC: 7.5 10*3/uL (ref 3.4–10.8)

## 2020-04-09 LAB — COMPREHENSIVE METABOLIC PANEL
ALT: 26 IU/L (ref 0–32)
AST: 24 IU/L (ref 0–40)
Albumin/Globulin Ratio: 1.5 (ref 1.2–2.2)
Albumin: 4.2 g/dL (ref 3.8–4.8)
Alkaline Phosphatase: 136 IU/L — ABNORMAL HIGH (ref 48–121)
BUN/Creatinine Ratio: 17 (ref 9–23)
BUN: 12 mg/dL (ref 6–20)
Bilirubin Total: <0.2 mg/dL (ref 0.0–1.2)
CO2: 24 mmol/L (ref 20–29)
Calcium: 9.1 mg/dL (ref 8.7–10.2)
Chloride: 106 mmol/L (ref 96–106)
Creatinine, Ser: 0.69 mg/dL (ref 0.57–1.00)
GFR calc Af Amer: 131 mL/min/{1.73_m2} (ref >59)
GFR calc non Af Amer: 114 mL/min/{1.73_m2} (ref >59)
Globulin, Total: 2.8 g/dL (ref 1.5–4.5)
Glucose: 94 mg/dL (ref 65–99)
Potassium: 4.4 mmol/L (ref 3.5–5.2)
Sodium: 143 mmol/L (ref 134–144)
Total Protein: 7 g/dL (ref 6.0–8.5)

## 2020-04-09 LAB — LIPID PANEL
Chol/HDL Ratio: 6.5 ratio — ABNORMAL HIGH (ref 0.0–4.4)
Cholesterol, Total: 254 mg/dL — ABNORMAL HIGH (ref 100–199)
HDL: 39 mg/dL — ABNORMAL LOW (ref >39)
LDL Chol Calc (NIH): 163 mg/dL — ABNORMAL HIGH (ref 0–99)
Triglycerides: 278 mg/dL — ABNORMAL HIGH (ref 0–149)
VLDL Cholesterol Cal: 52 mg/dL — ABNORMAL HIGH (ref 5–40)

## 2020-04-09 LAB — HEMOGLOBIN A1C
Est. average glucose Bld gHb Est-mCnc: 123 mg/dL
Hgb A1c MFr Bld: 5.9 % — ABNORMAL HIGH (ref 4.8–5.6)

## 2020-04-13 LAB — CYTOLOGY - PAP
Adequacy: ABSENT
Comment: NEGATIVE
Diagnosis: NEGATIVE
High risk HPV: NEGATIVE

## 2020-04-13 IMAGING — US ULTRAOUND FETAL BPP W/O NONSTRESS
1 series · 9 of 9 positions shown · non-contrast
Comparison: none

CLINICAL DATA: Uncontrolled maternal gestational diabetes.

EXAM:
BIOPHYSICAL PROFILE

[Series 1: ultraound fetal bpp w/o nonstress · 0.34mm/px · 9 acquisitions, 9 frames shown]
[im 1/9]
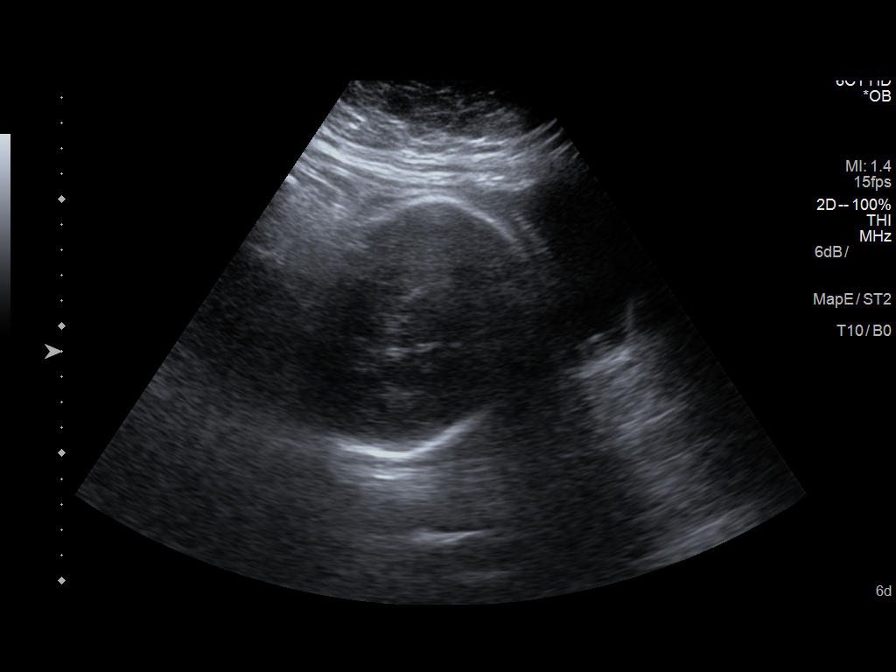
[im 2/9]
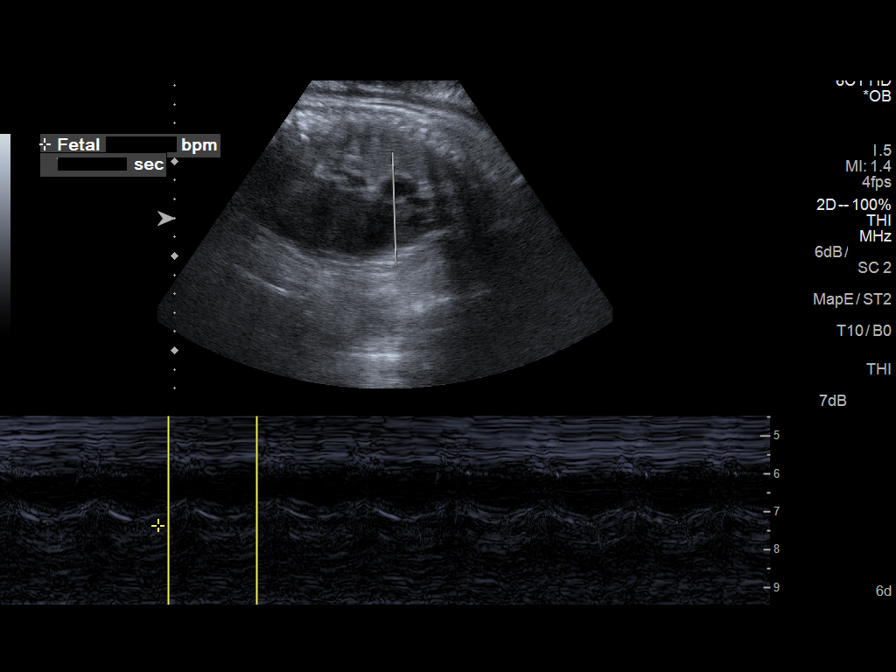
[im 3/9]
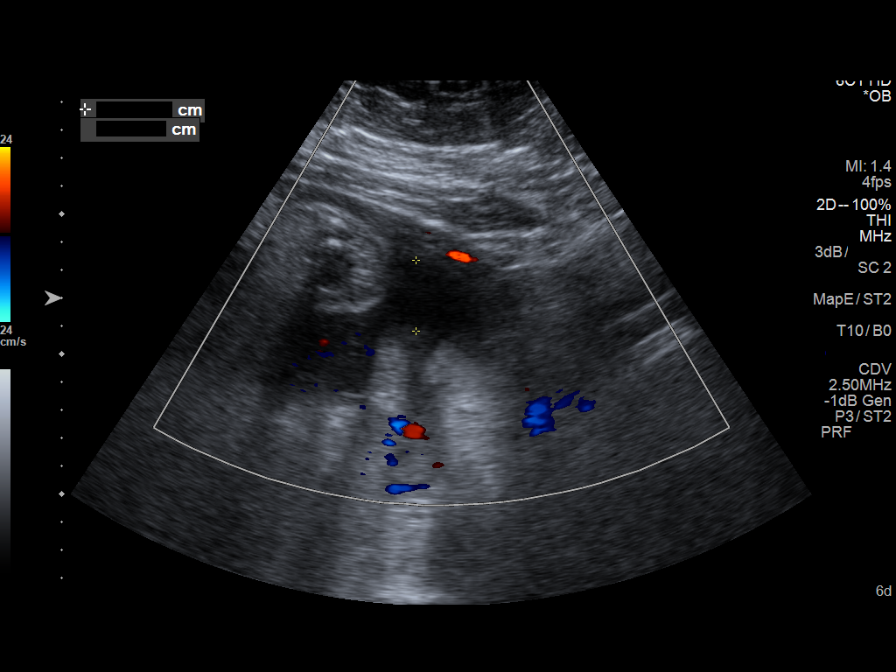
[im 4/9]
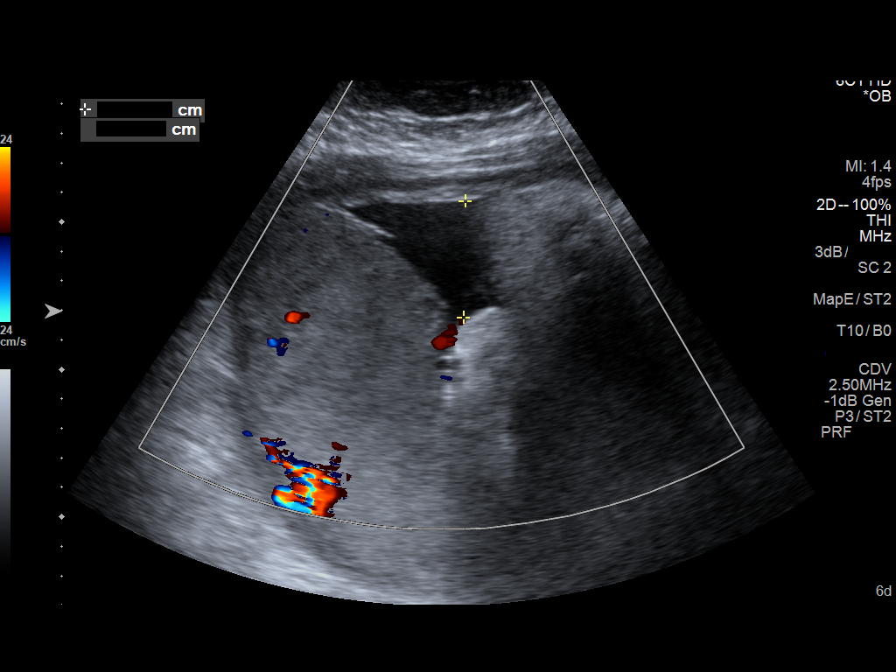
[im 5/9]
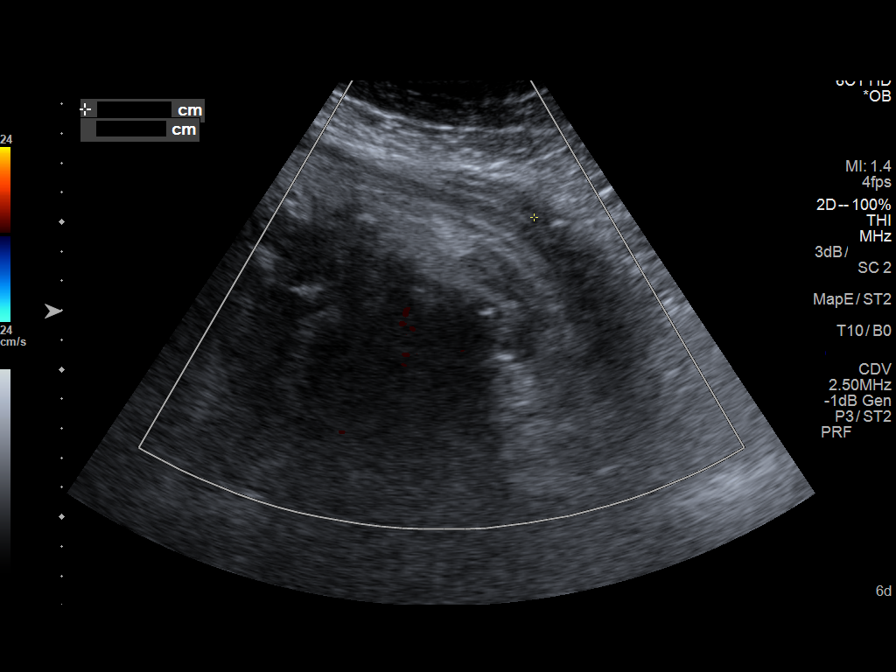
[im 6/9]
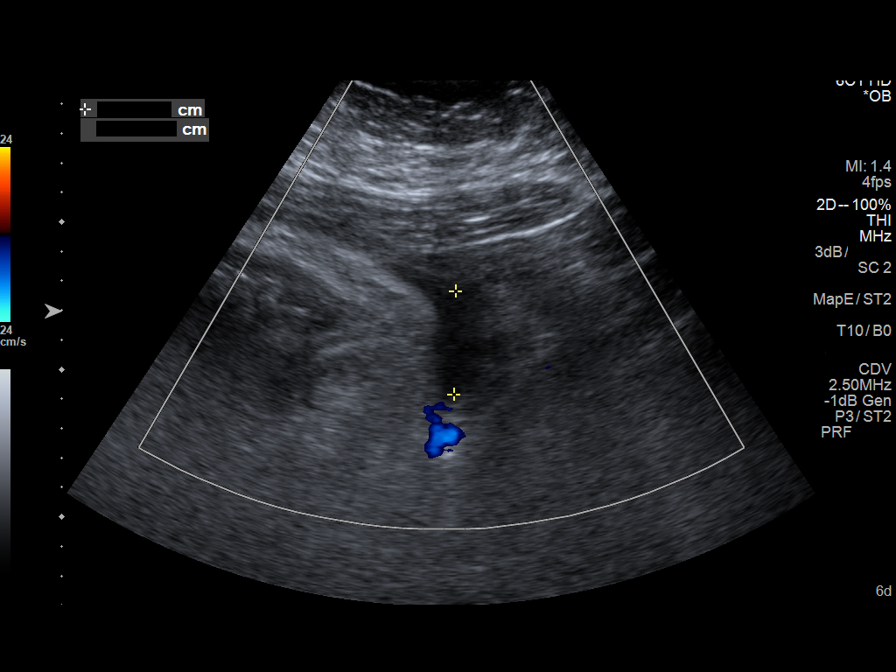
[im 7/9]
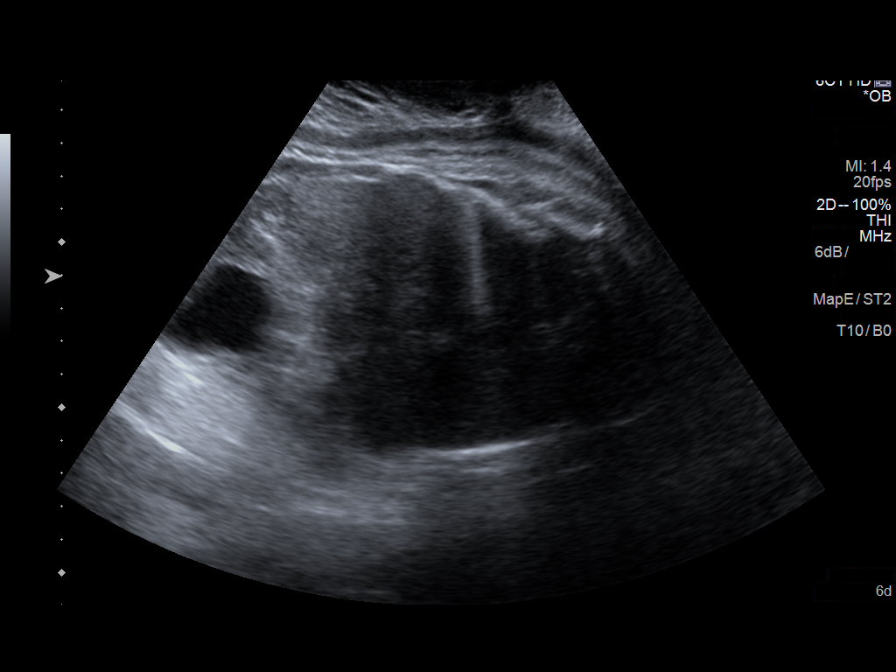
[im 8/9]
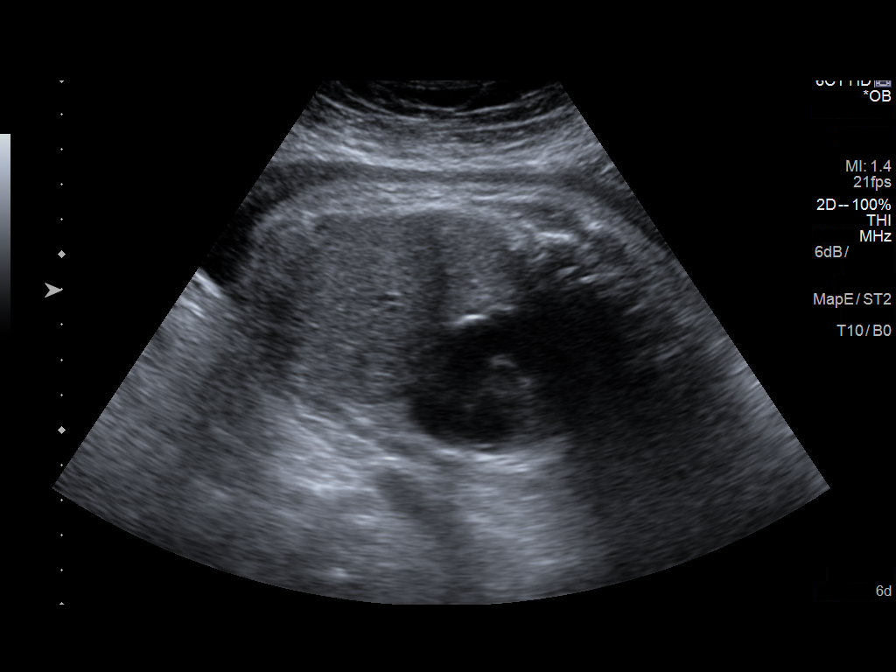
[im 9/9]
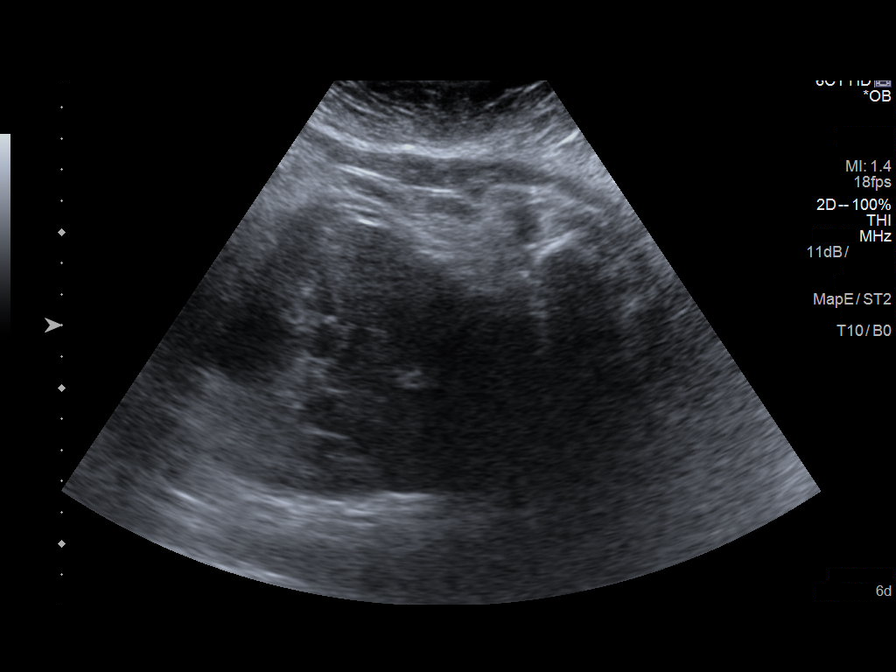

[9 of 9 positions shown; findings below may reference images not displayed]

FINDINGS: Number of Fetuses: 1

Heart Rate: 143 bpm

Presentation: Cephalic

Movement: 2    time: 15 minutes

Breathing: 2

Tone: 2

Amniotic Fluid: 2

Total Score: 8
IMPRESSION: Biophysical profile score of 8 of 8.

## 2020-04-14 ENCOUNTER — Telehealth: Payer: Self-pay

## 2020-04-14 NOTE — Telephone Encounter (Signed)
-----  Message from Burnard Hawthorne, [place] sent at 04/14/2020 10:01 AM EDT ----- Thanks for sharing Dr Doneen Poisson,  Would you reach to patient to sch f/u? She hasnt been seen by me in some time.  Recent labs show elevated alk phos, as well as prediabetes  Please make an appt so we can discuss in particular alk phos and further eval for this  ----- Message ----- From: Rubie Maid, MD Sent: 04/13/2020  12:32 PM EDT To: Burnard Hawthorne, FNP  Just wanted to forward these labs to you, just FYI.

## 2020-04-14 NOTE — Telephone Encounter (Signed)
Attempted to call patient and schedule for an appointment with PCP. Signal went out and the call was lost.

## 2020-04-15 NOTE — Telephone Encounter (Signed)
Attempted to call patient and schedule for an appointment with PCP. Left a message to call back.

## 2020-04-16 ENCOUNTER — Other Ambulatory Visit: Payer: Self-pay | Admitting: Obstetrics and Gynecology

## 2020-04-16 MED ORDER — METFORMIN HCL 500 MG PO TABS: ORAL_TABLET | ORAL | 6 refills | Status: DC

## 2020-04-16 NOTE — Progress Notes (Signed)
Metformin prescribed for pre-diabetes.

## 2020-04-17 ENCOUNTER — Telehealth: Payer: Self-pay | Admitting: Obstetrics and Gynecology

## 2020-04-17 ENCOUNTER — Ambulatory Visit: Payer: BC Managed Care – PPO | Admitting: Obstetrics and Gynecology

## 2020-04-17 NOTE — Telephone Encounter (Signed)
Called patient to ask her if she could bring a copy of her insurance card to the office as her provider was needing it and she didn't have one scanned in at her last visit due to the patient not having the card on her. Left message for patient to call the office.

## 2020-04-22 ENCOUNTER — Encounter: Payer: Self-pay | Admitting: Podiatry

## 2020-05-07 ENCOUNTER — Encounter: Payer: BC Managed Care – PPO | Admitting: Obstetrics and Gynecology

## 2020-05-11 ENCOUNTER — Ambulatory Visit: Payer: BC Managed Care – PPO | Admitting: Family

## 2020-05-11 ENCOUNTER — Other Ambulatory Visit: Payer: Self-pay

## 2020-05-11 ENCOUNTER — Encounter: Payer: Self-pay | Admitting: Family

## 2020-05-11 ENCOUNTER — Ambulatory Visit
Admission: EM | Admit: 2020-05-11 | Discharge: 2020-05-11 | Disposition: A | Discharge status: Home / Self Care | Payer: BC Managed Care – PPO | Attending: Emergency Medicine | Admitting: Emergency Medicine

## 2020-05-11 ENCOUNTER — Encounter: Payer: Self-pay | Admitting: Emergency Medicine

## 2020-05-11 DIAGNOSIS — J069 Acute upper respiratory infection, unspecified: Secondary | ICD-10-CM

## 2020-05-11 NOTE — ED Notes (Signed)
Patient in office today c/o no smell, cough and congestion.  Sx started on Saturday with congestion  OTC: mucinex  Denies: Fever, n/v  

## 2020-05-11 NOTE — Discharge Instructions (Signed)
Your COVID test is pending.  You should self quarantine until the test result is back.    Take Tylenol as needed for fever or discomfort.  Rest and keep yourself hydrated.    Go to the emergency department if you develop acute worsening symptoms.     

## 2020-05-11 NOTE — ED Provider Notes (Signed)
[name]    CSN: [id number] Arrival date & time: 05/11/20  0807      History   Chief Complaint Chief Complaint  Patient presents with  . Cough  . no smell    HPI Andrea Roberson is a 34 y.o. female.   Patient presents with sinus congestion, rhinorrhea, maxillary tenderness, nonproductive cough x3 days.  She states she has lost her sense of smell which may be due to the nasal congestion.  She denies fever, chills, shortness of breath, abdominal pain, nausea, vomiting, rash, or other symptoms.  Treatment attempted at home with Mucinex.  The history is provided by the patient.    Past Medical History:  Diagnosis Date  . Anxiety   . Asthma   . Bipolar disorder (HCC)   . Depression   . Frequent headaches   . GERD (gastroesophageal reflux disease)   . Gestational diabetes   . History of gestational diabetes 2016  . Hyperlipidemia   . Migraines   . Obesity (BMI 35.0-39.9 without comorbidity) 12/24/2015  . Tarsal coalition of left foot   . TMJ (dislocation of temporomandibular joint)   . UTI (urinary tract infection)   . Wears contact lenses     Patient Active Problem List   Diagnosis Date Noted  . MDD (major depressive disorder), recurrent, in partial remission (HCC) 01/28/2020  . MDD (major depressive disorder), recurrent episode, mild (HCC) 06/19/2019  . Bereavement 05/15/2019  . Insomnia due to medical condition 05/15/2019  . Elevated cholesterol 04/24/2019  . Dyspepsia   . Constipation 05/12/2017  . Generalized anxiety disorder 01/27/2016  . Shoulder pain, right 12/28/2015  . Obesity (BMI 35.0-39.9 without comorbidity) 12/24/2015  . Anxiety and depression 12/08/2015    Past Surgical History:  Procedure Laterality Date  . CESAREAN SECTION  2015  . CESAREAN SECTION WITH BILATERAL TUBAL LIGATION N/A 11/19/2018   Procedure: CESAREAN SECTION WITH BILATERAL TUBAL LIGATION;  Surgeon: Hildred Laser, MD;  Location: [facility];  Service: Obstetrics;   Laterality: N/A;  TOB 0822 APGAR 8/8    . ESOPHAGOGASTRODUODENOSCOPY (EGD) WITH PROPOFOL N/A 08/30/2017   Procedure: ESOPHAGOGASTRODUODENOSCOPY (EGD) WITH PROPOFOL;  Surgeon: Toney Reil, MD;  Location: [facility];  Service: Endoscopy;  Laterality: N/A;  . WISDOM TOOTH EXTRACTION      OB History    Gravida  2   Para  2   Term  2   Preterm      AB      Living  2     SAB      TAB      Ectopic      Multiple  0   Live Births  2            Home Medications    Prior to Admission medications   Medication Sig Start Date End Date Taking? Authorizing Provider  busPIRone (BUSPAR) 15 MG tablet TAKE 1 TABLET(15 MG) BY MOUTH TWICE DAILY 04/03/20   Jomarie Longs, MD  cetirizine (ZYRTEC) 10 MG tablet Take 10 mg by mouth daily as needed for allergies.     [provider]  cyclobenzaprine (FLEXERIL) 5 MG tablet Take 5 mg by mouth 3 (three) times daily as needed for muscle spasms.    [provider]  FLUoxetine (PROZAC) 40 MG capsule TAKE 2 CAPSULES(80 MG) BY MOUTH DAILY 03/02/20   Jomarie Longs, MD  fluticasone (FLONASE) 50 MCG/ACT nasal spray Place 2 sprays into both nostrils daily as needed for allergies.  [provider]  hydrOXYzine (ATARAX/VISTARIL) 25 MG tablet Take 0.5-1 tablets (12.5-25 mg total) by mouth 2 (two) times daily as needed. For anxiety, sleep 09/05/19   Jomarie Longs, MD  meloxicam (MOBIC) 15 MG tablet Take 1 tablet (15 mg total) by mouth daily. 04/23/19   Felecia Shelling, DPM  metFORMIN (GLUCOPHAGE) 500 MG tablet Take one tablet by mouth daily for one week. Then increase to one tablet twice a day for one week.  Then two tablets twice a day. 04/16/20   Hildred Laser, MD  testosterone cypionate (DEPOTESTOSTERONE CYPIONATE) 200 MG/ML injection Inject 0.3 mLs (60 mg total) into the muscle every 28 (twenty-eight) days. 04/08/20   Hildred Laser, MD    Family History Family History  Problem Relation Age of Onset  .  Heart disease Mother   . Diabetes Mother   . Anxiety disorder Mother   . Arthritis Mother   . Skin cancer Mother   . Diabetes Father   . Skin cancer Father   . Arthritis Father   . Colon polyps Father   . Peptic Ulcer Disease Father   . Colon polyps Brother   . Colon cancer Paternal Aunt   . Heart disease Maternal Grandmother   . Colon cancer Paternal Grandfather     Social History Social History   Tobacco Use  . Smoking status: Former Smoker    Packs/day: 1.50    Years: 8.00    Pack years: 12.00    Types: Cigarettes    Quit date: 09/16/2012    Years since quitting: 7.6  . Smokeless tobacco: Never Used  Vaping Use  . Vaping Use: Former  Substance Use Topics  . Alcohol use: Not Currently    Comment: 2 glasswine/month  . Drug use: No     Allergies   Patient has no known allergies.   Review of Systems Review of Systems  Constitutional: Negative for chills and fever.  HENT: Positive for congestion, postnasal drip, rhinorrhea, sinus pressure and sinus pain. Negative for ear pain and sore throat.   Eyes: Negative for pain and visual disturbance.  Respiratory: Negative for cough and shortness of breath.   Cardiovascular: Negative for chest pain and palpitations.  Gastrointestinal: Negative for abdominal pain, diarrhea, nausea and vomiting.  Genitourinary: Negative for dysuria and hematuria.  Musculoskeletal: Negative for arthralgias and back pain.  Skin: Negative for color change and rash.  Neurological: Negative for seizures and syncope.  All other systems reviewed and are negative.    Physical Exam Triage Vital Signs ED Triage Vitals  Enc Vitals Group     BP 05/11/20 0812 (!) 125/86     Pulse Rate 05/11/20 0812 100     Resp 05/11/20 0812 16     Temp 05/11/20 0812 99.7 F (37.6 C)     Temp src --      SpO2 05/11/20 0812 96 %     Weight 05/11/20 0813 (!) 250 lb (113.4 kg)     Height --      Head Circumference --      Peak Flow --      Pain Score  05/11/20 0813 0     Pain Loc --      Pain Edu? --      Excl. in GC? --    No data found.  Updated Vital Signs BP (!) 125/86 (BP Location: Left Arm)   Pulse 100   Temp 99.7 F (37.6 C)   Resp 16   Wt (!)  250 lb (113.4 kg)   LMP 02/14/2018   SpO2 96%   BMI 41.60 kg/m   Visual Acuity Right Eye Distance:   Left Eye Distance:   Bilateral Distance:    Right Eye Near:   Left Eye Near:    Bilateral Near:     Physical Exam Vitals and nursing note reviewed.  Constitutional:      General: She is not in acute distress.    Appearance: She is well-developed. She is not ill-appearing.  HENT:     Head: Normocephalic and atraumatic.     Right Ear: Tympanic membrane normal.     Left Ear: Tympanic membrane normal.     Nose: Congestion and rhinorrhea present.     Mouth/Throat:     Mouth: Mucous membranes are moist.     Pharynx: Oropharynx is clear.  Eyes:     Conjunctiva/sclera: Conjunctivae normal.  Cardiovascular:     Rate and Rhythm: Normal rate and regular rhythm.     Heart sounds: No murmur heard.   Pulmonary:     Effort: Pulmonary effort is normal. No respiratory distress.     Breath sounds: Normal breath sounds.  Abdominal:     Palpations: Abdomen is soft.     Tenderness: There is no abdominal tenderness. There is no guarding or rebound.  Musculoskeletal:     Cervical back: Neck supple.  Skin:    General: Skin is warm and dry.     Findings: No rash.  Neurological:     General: No focal deficit present.     Mental Status: She is alert and oriented to person, place, and time.     Gait: Gait normal.  Psychiatric:        Mood and Affect: Mood normal.        Behavior: Behavior normal.      UC Treatments / Results  Labs (all labs ordered are listed, but only abnormal results are displayed) Labs Reviewed  NOVEL CORONAVIRUS, NAA    EKG   Radiology No results found.  Procedures Procedures (including critical care time)  Medications Ordered in  UC Medications - No data to display  Initial Impression / Assessment and Plan / UC Course  I have reviewed the triage vital signs and the nursing notes.  Pertinent labs & imaging results that were available during my care of the patient were reviewed by me and considered in my medical decision making (see chart for details).   Viral URI.  Discussed symptomatic treatment with ibuprofen and Mucinex.  PCR COVID pending.  Instructed patient to self quarantine.  Instructed her to go to the ED if she has acute worsening symptoms such as shortness of breath or severe diarrhea.  Patient agrees to plan of care.     Final Clinical Impressions(s) / UC Diagnoses   Final diagnoses:  Viral upper respiratory tract infection     Discharge Instructions     Your COVID test is pending.  You should self quarantine until the test result is back.    Take Tylenol as needed for fever or discomfort.  Rest and keep yourself hydrated.    Go to the emergency department if you develop acute worsening symptoms.        ED Prescriptions    None     PDMP not reviewed this encounter.   Mickie Bail, NP 05/11/20 [phone]

## 2020-05-11 NOTE — ED Triage Notes (Signed)
Patient in office today c/o no smell, cough and congestion.  Sx started on Saturday with congestion  OTC: mucinex  Denies: Fever, n/v

## 2020-05-12 LAB — SARS-COV-2, NAA 2 DAY TAT

## 2020-05-12 LAB — NOVEL CORONAVIRUS, NAA: SARS-CoV-2, NAA: NOT DETECTED

## 2020-05-15 ENCOUNTER — Encounter: Payer: BC Managed Care – PPO | Admitting: Obstetrics and Gynecology

## 2020-05-21 ENCOUNTER — Telehealth (INDEPENDENT_AMBULATORY_CARE_PROVIDER_SITE_OTHER): Payer: BC Managed Care – PPO | Admitting: Psychiatry

## 2020-05-21 ENCOUNTER — Other Ambulatory Visit: Payer: Self-pay

## 2020-05-21 ENCOUNTER — Encounter: Payer: Self-pay | Admitting: Psychiatry

## 2020-05-21 DIAGNOSIS — G4701 Insomnia due to medical condition: Secondary | ICD-10-CM

## 2020-05-21 DIAGNOSIS — F3342 Major depressive disorder, recurrent, in full remission: Secondary | ICD-10-CM

## 2020-05-21 DIAGNOSIS — F411 Generalized anxiety disorder: Secondary | ICD-10-CM | POA: Diagnosis not present

## 2020-05-21 MED ORDER — BUSPIRONE HCL 15 MG PO TABS: ORAL_TABLET | ORAL | 1 refills | Status: DC

## 2020-05-21 MED ORDER — FLUOXETINE HCL 40 MG PO CAPS: ORAL_CAPSULE | ORAL | 1 refills | Status: DC

## 2020-05-21 NOTE — Progress Notes (Signed)
Provider Location : [facility] Patient Location : Home  Virtual Visit via Video Note  I connected with Andrea Roberson on 05/21/20 at 10:20 AM EDT by a video enabled telemedicine application and verified that I am speaking with the correct person using two identifiers.   I discussed the limitations of evaluation and management by telemedicine and the availability of in person appointments. The patient expressed understanding and agreed to proceed.    I discussed the assessment and treatment plan with the patient. The patient was provided an opportunity to ask questions and all were answered. The patient agreed with the plan and demonstrated an understanding of the instructions.   The patient was advised to call back or seek an in-person evaluation if the symptoms worsen or if the condition fails to improve as anticipated.   BH MD OP Progress Note  05/21/2020 12:20 PM Andrea Roberson  MRN:  [id number]  Chief Complaint:  Chief Complaint    Follow-up     HPI: Andrea Roberson is a 34 year old female, married, lives in [place], has a history of GAD, MDD, hyperlipidemia, insomnia was evaluated by telemedicine today.  Patient today reports she is currently making progress with regards to her mood.  She reports she is tolerating the medications well.  She is not interested in dosage increase and wants to stay on this dosage at this time.  She denies side effects.  She reports sleep continues to be restless since her 92-1/2-year-old baby does not sleep through the night.  This does affect her.  She however is overall coping okay and is not interested in medication for sleep at this time.  She reports she does have psychosocial stressors of her mother who struggles with health issues.  She is trying to get her mother into an assisted living facility.  She reports school is going to start back on August 16.  Patient denies any suicidality, homicidality or perceptual disturbances.  She  continues to follow-up with her therapist.  She denies any other concerns today.    Visit Diagnosis:    ICD-10-CM   1. Generalized anxiety disorder  F41.1 FLUoxetine (PROZAC) 40 MG capsule    busPIRone (BUSPAR) 15 MG tablet  2. MDD (major depressive disorder), recurrent, in full remission (HCC)  F33.42 busPIRone (BUSPAR) 15 MG tablet  3. Insomnia due to medical condition  G47.01     Past Psychiatric History: I have reviewed past psychiatric history from my progress note on 05/15/2019.  Past trials of Prozac, trazodone, BuSpar, Zoloft Past Medical History:  Past Medical History:  Diagnosis Date  . Anxiety   . Asthma   . Bipolar disorder (HCC)   . Depression   . Frequent headaches   . GERD (gastroesophageal reflux disease)   . Gestational diabetes   . History of gestational diabetes 2016  . Hyperlipidemia   . Migraines   . Obesity (BMI 35.0-39.9 without comorbidity) 12/24/2015  . Tarsal coalition of left foot   . TMJ (dislocation of temporomandibular joint)   . UTI (urinary tract infection)   . Wears contact lenses     Past Surgical History:  Procedure Laterality Date  . CESAREAN SECTION  2015  . CESAREAN SECTION WITH BILATERAL TUBAL LIGATION N/A 11/19/2018   Procedure: CESAREAN SECTION WITH BILATERAL TUBAL LIGATION;  Surgeon: Hildred Laser, MD;  Location: [facility];  Service: Obstetrics;  Laterality: N/A;  TOB 0822 APGAR 8/8    . ESOPHAGOGASTRODUODENOSCOPY (EGD) WITH PROPOFOL N/A 08/30/2017   Procedure: ESOPHAGOGASTRODUODENOSCOPY (  EGD) WITH PROPOFOL;  Surgeon: Toney Reil, MD;  Location: [facility];  Service: Endoscopy;  Laterality: N/A;  . WISDOM TOOTH EXTRACTION      Family Psychiatric History: I have reviewed family psychiatric history from my progress note from 05/15/2019  Family History:  Family History  Problem Relation Age of Onset  . Heart disease Mother   . Diabetes Mother   . Anxiety disorder Mother   . Arthritis Mother   . Skin cancer Mother    . Diabetes Father   . Skin cancer Father   . Arthritis Father   . Colon polyps Father   . Peptic Ulcer Disease Father   . Colon polyps Brother   . Colon cancer Paternal Aunt   . Heart disease Maternal Grandmother   . Colon cancer Paternal Grandfather     Social History: I have reviewed social history from my progress note on 05/15/2019 Social History   Socioeconomic History  . Marital status: Married    Spouse name: Not on file  . Number of children: Not on file  . Years of education: Not on file  . Highest education level: Not on file  Occupational History  . Not on file  Tobacco Use  . Smoking status: Former Smoker    Packs/day: 1.50    Years: 8.00    Pack years: 12.00    Types: Cigarettes    Quit date: 09/16/2012    Years since quitting: 7.6  . Smokeless tobacco: Never Used  Vaping Use  . Vaping Use: Former  Substance and Sexual Activity  . Alcohol use: Not Currently    Comment: 2 glasswine/month  . Drug use: No  . Sexual activity: Yes    Birth control/protection: None  Other Topics Concern  . Not on file  Social History Narrative   Married   5 yo son , 5 month daughter   Runner, broadcasting/film/video- pre K      Social Determinants of Health   Financial Resource Strain:   . Difficulty of Paying Living Expenses:   Food Insecurity:   . Worried About Programme researcher, broadcasting/film/video in the Last Year:   . Barista in the Last Year:   Transportation Needs:   . Freight forwarder (Medical):   Marland Kitchen Lack of Transportation (Non-Medical):   Physical Activity:   . Days of Exercise per Week:   . Minutes of Exercise per Session:   Stress:   . Feeling of Stress :   Social Connections:   . Frequency of Communication with Friends and Family:   . Frequency of Social Gatherings with Friends and Family:   . Attends Religious Services:   . Active Member of Clubs or Organizations:   . Attends Banker Meetings:   Marland Kitchen Marital Status:     Allergies: No Known Allergies  Metabolic  Disorder Labs: Lab Results  Component Value Date   HGBA1C 5.9 (H) 04/08/2020   No results found for: PROLACTIN Lab Results  Component Value Date   CHOL 254 (H) 04/08/2020   TRIG 278 (H) 04/08/2020   HDL 39 (L) 04/08/2020   CHOLHDL 6.5 (H) 04/08/2020   VLDL 34.8 02/19/2018   LDLCALC 163 (H) 04/08/2020   LDLCALC 126 (H) 04/04/2019   Lab Results  Component Value Date   TSH 0.90 05/29/2019   TSH 1.180 05/08/2018    Therapeutic Level Labs: No results found for: LITHIUM No results found for: VALPROATE No components found for:  CBMZ  Current Medications: Current Outpatient Medications  Medication Sig Dispense Refill  . busPIRone (BUSPAR) 15 MG tablet TAKE 1 TABLET(15 MG) BY MOUTH TWICE DAILY 180 tablet 1  . cetirizine (ZYRTEC) 10 MG tablet Take 10 mg by mouth daily as needed for allergies.     . cyclobenzaprine (FLEXERIL) 5 MG tablet Take 5 mg by mouth 3 (three) times daily as needed for muscle spasms.    Marland Kitchen FLUoxetine (PROZAC) 40 MG capsule Take 80 mg ( 2 capsules)  daily by mouth 180 capsule 1  . fluticasone (FLONASE) 50 MCG/ACT nasal spray Place 2 sprays into both nostrils daily as needed for allergies.     . hydrOXYzine (ATARAX/VISTARIL) 25 MG tablet Take 0.5-1 tablets (12.5-25 mg total) by mouth 2 (two) times daily as needed. For anxiety, sleep 60 tablet 1  . meloxicam (MOBIC) 15 MG tablet Take 1 tablet (15 mg total) by mouth daily. 30 tablet 1  . metFORMIN (GLUCOPHAGE) 500 MG tablet Take one tablet by mouth daily for one week. Then increase to one tablet twice a day for one week.  Then two tablets twice a day. 120 tablet 6  . mometasone (ELOCON) 0.1 % lotion SMARTSIG:4 Drop(s) In Ear(s) Every Night PRN    . testosterone cypionate (DEPOTESTOSTERONE CYPIONATE) 200 MG/ML injection Inject 0.3 mLs (60 mg total) into the muscle every 28 (twenty-eight) days. 10 mL 0   No current facility-administered medications for this visit.     Musculoskeletal: Strength & Muscle Tone:  UTA Gait & Station: normal Patient leans: N/A  Psychiatric Specialty Exam: Review of Systems  Psychiatric/Behavioral: Positive for sleep disturbance. The patient is nervous/anxious.   All other systems reviewed and are negative.   currently breastfeeding.There is no height or weight on file to calculate BMI.  General Appearance: Casual  Eye Contact:  Fair  Speech:  Clear and Coherent  Volume:  Normal  Mood:  Anxious Coping well  Affect:  Congruent  Thought Process:  Goal Directed and Descriptions of Associations: Intact  Orientation:  Full (Time, Place, and Person)  Thought Content: Logical   Suicidal Thoughts:  No  Homicidal Thoughts:  No  Memory:  Immediate;   Fair Recent;   Fair Remote;   Fair  Judgement:  Fair  Insight:  Fair  Psychomotor Activity:  Normal  Concentration:  Concentration: Fair and Attention Span: Fair  Recall:  [employer] of Knowledge: Fair  Language: Fair  Akathisia:  No  Handed:  Right  AIMS (if indicated): UTA  Assets:  Communication Skills Desire for Improvement Housing Social Support  ADL's:  Intact  Cognition: WNL  Sleep:  Restless due to having a toddler   Screenings: GAD-7     Office Visit from 04/04/2019 in [facility]  Total GAD-7 Score 19    PHQ2-9     Office Visit from 06/19/2019 in [facility] Office Visit from 04/24/2019 in [facility] Office Visit from 04/04/2019 in [facility] Nutrition from 09/21/2018 in [facility] [place] Nutrition from 05/04/2018 in [facility] [place]  PHQ-2 Total Score 3 5 4 2  0  PHQ-9 Total Score 12 18 16 5  --       Assessment and Plan: Andrea Roberson is a 34 year old female, has a history of GAD, insomnia, hyperlipidemia was evaluated by telemedicine today.  Patient is biologically predisposed given her family history.  Patient with psychosocial stressors of recent death of her father, having a newborn, mother's  early dementia  and recent falls, job related stressors on the current pandemic.  Patient however is currently making progress on the current medication regimen although she continues to have situational stressors.  She will continue to benefit from psychotherapy sessions.  Plan as noted below.  Plan GAD-improving Prozac 80 mg p.o. daily BuSpar 15 mg p.o. twice daily Hydroxyzine 25 mg p.o. nightly as needed for anxiety attacks   MDD in remission Prozac as prescribed BuSpar 15 mg p.o. twice daily Patient is aware about the breast-feeding implication of being on BuSpar. She is currently trying to wean her baby off breastmilk.   Bereavement-improving Continue CBT with [facility]  Insomnia-restless Discussed sleep hygiene techniques.  Her sleep issues are due to having a baby.  Follow-up in clinic in 1 month or sooner if needed.   I have spent atleast 20 minutes non  face to face with patient today. More than 50 % of the time was spent for  ordering medications and test ,psychoeducation and supportive psychotherapy and care coordination,as well as documenting clinical information in electronic health record. This note was generated in part or whole with voice recognition software. Voice recognition is usually quite accurate but there are transcription errors that can and very often do occur. I apologize for any typographical errors that were not detected and corrected.      Jomarie LongsSaramma Jibran Crookshanks, MD 05/21/2020, 12:20 PM

## 2020-05-26 ENCOUNTER — Ambulatory Visit (INDEPENDENT_AMBULATORY_CARE_PROVIDER_SITE_OTHER): Payer: BC Managed Care – PPO | Admitting: Obstetrics and Gynecology

## 2020-05-26 ENCOUNTER — Encounter: Payer: Self-pay | Admitting: Obstetrics and Gynecology

## 2020-05-26 VITALS — BP 120/83 | HR 82 | Ht 65.0 in | Wt 253.3 lb

## 2020-05-26 DIAGNOSIS — F520 Hypoactive sexual desire disorder: Secondary | ICD-10-CM

## 2020-05-26 NOTE — Progress Notes (Signed)
Pt present for f/u for libido. Pt stated that she is still having symptoms of libido/decrease sex drive.  Pt think she may have had an allergic reaction to [facility]. Pt stated that she had n/v, mouth itchy, overall hot feeling all over.

## 2020-05-26 NOTE — Progress Notes (Signed)
    GYNECOLOGY PROGRESS NOTE  Subjective:    Patient ID: Andrea Roberson, female    DOB: Oct 18, 1985, 34 y.o.   MRN: [id number]  HPI  Patient is a 34 y.o. G32P2002 female who presents for complaints of 1 month f/u of decreased libido (HSDD).  Was started on Vylessi last visit.  Notes that with first injection she had an allergic reaction (nausea/vomiting, itchy mouth, feeling flushed).   The following portions of the patient's history were reviewed and updated as appropriate: allergies, current medications, past family history, past medical history, past social history, past surgical history and problem list.  Review of Systems Pertinent items noted in HPI and remainder of comprehensive ROS otherwise negative.   Objective:   Blood pressure 120/83, pulse 82, height 5\' 5"  (1.651 m), weight 253 lb 4.8 oz (114.9 kg), currently breastfeeding. General appearance: alert and no distress Remainder of exam deferred.    Assessment:   Hypoactive sexual desire disorder  Plan:   - Discussion had on other treatment options for HSDD, including testosterone cream (previously attempted to prescribe injections, however was too expensive), or Addyi.  Patient notes she would like to try the testosterone cream. Will send prescription to compounding pharmacy.  Also sent prescription for Scream Cream.   - Patient to f/u in 1-2 months to reassess symptoms.

## 2020-06-24 ENCOUNTER — Ambulatory Visit: Payer: BC Managed Care – PPO | Admitting: Family

## 2020-07-08 ENCOUNTER — Telehealth: Payer: BC Managed Care – PPO | Admitting: Psychiatry

## 2020-07-09 ENCOUNTER — Encounter: Payer: Self-pay | Admitting: Psychiatry

## 2020-07-09 ENCOUNTER — Telehealth (INDEPENDENT_AMBULATORY_CARE_PROVIDER_SITE_OTHER): Payer: BC Managed Care – PPO | Admitting: Psychiatry

## 2020-07-09 ENCOUNTER — Other Ambulatory Visit: Payer: Self-pay

## 2020-07-09 DIAGNOSIS — G4701 Insomnia due to medical condition: Secondary | ICD-10-CM

## 2020-07-09 DIAGNOSIS — F3342 Major depressive disorder, recurrent, in full remission: Secondary | ICD-10-CM | POA: Diagnosis not present

## 2020-07-09 DIAGNOSIS — F411 Generalized anxiety disorder: Secondary | ICD-10-CM | POA: Diagnosis not present

## 2020-07-09 NOTE — Progress Notes (Signed)
Provider Location : [facility] Patient Location : Home  Participants: Patient , Provider  Virtual Visit via Video Note  I connected with Andrea Roberson on 07/09/20 at  2:20 PM EDT by a video enabled telemedicine application and verified that I am speaking with the correct person using two identifiers.   I discussed the limitations of evaluation and management by telemedicine and the availability of in person appointments. The patient expressed understanding and agreed to proceed.    I discussed the assessment and treatment plan with the patient. The patient was provided an opportunity to ask questions and all were answered. The patient agreed with the plan and demonstrated an understanding of the instructions.   The patient was advised to call back or seek an in-person evaluation if the symptoms worsen or if the condition fails to improve as anticipated.   BH MD OP Progress Note  07/09/2020 2:33 PM Consuella GAE [facility]  MRN:  [id number]  Chief Complaint:  Chief Complaint    Follow-up     HPI: Andrea Roberson is a 34 year old female, married, lives in [place], has a history of GAD, MDD, hyperlipidemia, insomnia was evaluated by telemedicine today.  Patient today reports she is currently struggling with anxiety symptoms due to job related stressors.  She is hence taking a break from school for a day.  She reports she really deserves it.  She reports overall she has been coping okay.  She denies any significant sadness or crying spells.  She reports sleep and appetite as fair.  She continues to breast-feed her baby.  Patient is compliant on medications.  She denies any side effects.  She denies any suicidality, homicidality or perceptual disturbances.  Patient denies any other concerns today.    Visit Diagnosis:    ICD-10-CM   1. Generalized anxiety disorder  F41.1   2. MDD (major depressive disorder), recurrent, in full remission (HCC)  F33.42   3. Insomnia due to  medical condition  G47.01     Past Psychiatric History: I have reviewed past psychiatric history from my progress note on 05/15/2019.  Past trials of Prozac, trazodone, BuSpar, Zoloft  Past Medical History:  Past Medical History:  Diagnosis Date  . Anxiety   . Asthma   . Bipolar disorder (HCC)   . Depression   . Frequent headaches   . GERD (gastroesophageal reflux disease)   . Gestational diabetes   . History of gestational diabetes 2016  . Hyperlipidemia   . Migraines   . Obesity (BMI 35.0-39.9 without comorbidity) 12/24/2015  . Tarsal coalition of left foot   . TMJ (dislocation of temporomandibular joint)   . UTI (urinary tract infection)   . Wears contact lenses     Past Surgical History:  Procedure Laterality Date  . CESAREAN SECTION  2015  . CESAREAN SECTION WITH BILATERAL TUBAL LIGATION N/A 11/19/2018   Procedure: CESAREAN SECTION WITH BILATERAL TUBAL LIGATION;  Surgeon: Hildred Laser, MD;  Location: [facility];  Service: Obstetrics;  Laterality: N/A;  TOB 0822 APGAR 8/8    . ESOPHAGOGASTRODUODENOSCOPY (EGD) WITH PROPOFOL N/A 08/30/2017   Procedure: ESOPHAGOGASTRODUODENOSCOPY (EGD) WITH PROPOFOL;  Surgeon: Toney Reil, MD;  Location: [facility];  Service: Endoscopy;  Laterality: N/A;  . WISDOM TOOTH EXTRACTION      Family Psychiatric History: I have reviewed family psychiatric history from my progress note on 05/15/2019  Family History:  Family History  Problem Relation Age of Onset  . Heart disease Mother   .  Diabetes Mother   . Anxiety disorder Mother   . Arthritis Mother   . Skin cancer Mother   . Diabetes Father   . Skin cancer Father   . Arthritis Father   . Colon polyps Father   . Peptic Ulcer Disease Father   . Colon polyps Brother   . Colon cancer Paternal Aunt   . Heart disease Maternal Grandmother   . Colon cancer Paternal Grandfather     Social History: I have reviewed social history from my progress note on 05/15/2019 Social History    Socioeconomic History  . Marital status: Married    Spouse name: Not on file  . Number of children: Not on file  . Years of education: Not on file  . Highest education level: Not on file  Occupational History  . Not on file  Tobacco Use  . Smoking status: Former Smoker    Packs/day: 1.50    Years: 8.00    Pack years: 12.00    Types: Cigarettes    Quit date: 09/16/2012    Years since quitting: 7.8  . Smokeless tobacco: Never Used  Vaping Use  . Vaping Use: Former  Substance and Sexual Activity  . Alcohol use: Not Currently    Comment: 2 glasswine/month  . Drug use: No  . Sexual activity: Yes    Birth control/protection: None, Surgical    Comment: tubal  Other Topics Concern  . Not on file  Social History Narrative   Married   5 yo son , 5 month daughter   Runner, broadcasting/film/video- pre K      Social Determinants of Health   Financial Resource Strain:   . Difficulty of Paying Living Expenses: Not on file  Food Insecurity:   . Worried About Programme researcher, broadcasting/film/video in the Last Year: Not on file  . Ran Out of Food in the Last Year: Not on file  Transportation Needs:   . Lack of Transportation (Medical): Not on file  . Lack of Transportation (Non-Medical): Not on file  Physical Activity:   . Days of Exercise per Week: Not on file  . Minutes of Exercise per Session: Not on file  Stress:   . Feeling of Stress : Not on file  Social Connections:   . Frequency of Communication with Friends and Family: Not on file  . Frequency of Social Gatherings with Friends and Family: Not on file  . Attends Religious Services: Not on file  . Active Member of Clubs or Organizations: Not on file  . Attends Banker Meetings: Not on file  . Marital Status: Not on file    Allergies:  Allergies  Allergen Reactions  . Vyleesi [Bremelanotide] Itching, Nausea And Vomiting and Other (See Comments)    Flushing    Metabolic Disorder Labs: Lab Results  Component Value Date   HGBA1C 5.9 (H)  04/08/2020   No results found for: PROLACTIN Lab Results  Component Value Date   CHOL 254 (H) 04/08/2020   TRIG 278 (H) 04/08/2020   HDL 39 (L) 04/08/2020   CHOLHDL 6.5 (H) 04/08/2020   VLDL 34.8 02/19/2018   LDLCALC 163 (H) 04/08/2020   LDLCALC 126 (H) 04/04/2019   Lab Results  Component Value Date   TSH 0.90 05/29/2019   TSH 1.180 05/08/2018    Therapeutic Level Labs: No results found for: LITHIUM No results found for: VALPROATE No components found for:  CBMZ  Current Medications: Current Outpatient Medications  Medication Sig Dispense Refill  .  busPIRone (BUSPAR) 15 MG tablet TAKE 1 TABLET(15 MG) BY MOUTH TWICE DAILY 180 tablet 1  . cetirizine (ZYRTEC) 10 MG tablet Take 10 mg by mouth daily as needed for allergies.     Marland Kitchen FLUoxetine (PROZAC) 40 MG capsule Take 80 mg ( 2 capsules)  daily by mouth 180 capsule 1  . fluticasone (FLONASE) 50 MCG/ACT nasal spray Place 2 sprays into both nostrils daily as needed for allergies.     . hydrOXYzine (ATARAX/VISTARIL) 25 MG tablet Take 0.5-1 tablets (12.5-25 mg total) by mouth 2 (two) times daily as needed. For anxiety, sleep 60 tablet 1  . meloxicam (MOBIC) 15 MG tablet Take 1 tablet (15 mg total) by mouth daily. 30 tablet 1  . metFORMIN (GLUCOPHAGE) 500 MG tablet Take one tablet by mouth daily for one week. Then increase to one tablet twice a day for one week.  Then two tablets twice a day. 120 tablet 6  . mometasone (ELOCON) 0.1 % lotion SMARTSIG:4 Drop(s) In Ear(s) Every Night PRN     No current facility-administered medications for this visit.     Musculoskeletal: Strength & Muscle Tone: UTA Gait & Station: normal Patient leans: N/A  Psychiatric Specialty Exam: Review of Systems  Psychiatric/Behavioral: The patient is nervous/anxious.   All other systems reviewed and are negative.   currently breastfeeding.There is no height or weight on file to calculate BMI.  General Appearance: Casual  Eye Contact:  Fair  Speech:   Clear and Coherent  Volume:  Normal  Mood:  Anxious coping okay  Affect:  Congruent  Thought Process:  Goal Directed and Descriptions of Associations: Intact  Orientation:  Full (Time, Place, and Person)  Thought Content: Logical   Suicidal Thoughts:  No  Homicidal Thoughts:  No  Memory:  Immediate;   Fair Recent;   Fair Remote;   Fair  Judgement:  Fair  Insight:  Fair  Psychomotor Activity:  Normal  Concentration:  Concentration: Fair and Attention Span: Fair  Recall:  [employer] of Knowledge: Fair  Language: Fair  Akathisia:  No  Handed:  Right  AIMS (if indicated): UTA  Assets:  Communication Skills Desire for Improvement Housing Social Support  ADL's:  Intact  Cognition: WNL  Sleep:  Fair   Screenings: GAD-7     Office Visit from 04/04/2019 in [facility]  Total GAD-7 Score 19    PHQ2-9     Office Visit from 06/19/2019 in [facility] Office Visit from 04/24/2019 in [facility] Office Visit from 04/04/2019 in [facility] Nutrition from 09/21/2018 in [facility] [place] Nutrition from 05/04/2018 in [facility] [place]  PHQ-2 Total Score 3 5 4 2  0  PHQ-9 Total Score 12 18 16 5  --       Assessment and Plan: Andrea Roberson is a 34 year old female, has a history of GAD, insomnia, hyperlipidemia was evaluated by telemedicine today.  Patient is biologically predisposed given her family history.  Patient with psychosocial stressors of recent death of her father, mother's early dementia and recent falls, job related stressors and having a toddler.  Patient is currently stable on current medication regimen.  Plan as noted below.  Plan GAD-stable Prozac 80 mg p.o. daily BuSpar 15 mg p.o. twice daily Hydroxyzine 25 mg p.o. nightly as needed for anxiety attacks  MDD in remission Prozac as prescribed BuSpar 15 mg p.o. twice daily   Bereavement-improving Continue CBT with  reclaim  Insomnia-stable She will  continue to work on sleep hygiene techniques  Follow-up in clinic in 3 months or sooner if needed.  I have spent atleast 20 minutes face to face with patient today. More than 50 % of the time was spent for preparing to see the patient ( e.g., review of test, records ), ordering medications and test ,psychoeducation and supportive psychotherapy and care coordination,as well as documenting clinical information in electronic health record. This note was generated in part or whole with voice recognition software. Voice recognition is usually quite accurate but there are transcription errors that can and very often do occur. I apologize for any typographical errors that were not detected and corrected.      Jomarie LongsSaramma Shlome Baldree, MD 07/09/2020, 2:33 PM

## 2020-07-24 ENCOUNTER — Encounter: Payer: Self-pay | Admitting: Family

## 2020-07-24 ENCOUNTER — Other Ambulatory Visit: Payer: Self-pay

## 2020-07-24 ENCOUNTER — Ambulatory Visit (INDEPENDENT_AMBULATORY_CARE_PROVIDER_SITE_OTHER): Payer: BC Managed Care – PPO | Admitting: Family

## 2020-07-24 VITALS — BP 110/76 | HR 93 | Temp 98.2°F | Ht 65.0 in | Wt 249.6 lb

## 2020-07-24 DIAGNOSIS — F419 Anxiety disorder, unspecified: Secondary | ICD-10-CM | POA: Diagnosis not present

## 2020-07-24 DIAGNOSIS — F32A Depression, unspecified: Secondary | ICD-10-CM

## 2020-07-24 DIAGNOSIS — Z23 Encounter for immunization: Secondary | ICD-10-CM | POA: Diagnosis not present

## 2020-07-24 DIAGNOSIS — E669 Obesity, unspecified: Secondary | ICD-10-CM

## 2020-07-24 NOTE — Patient Instructions (Addendum)
We talked about saxenda. When you are done breastfeeding , we can talk about this again:)  This is  Dr. Melina Schools  example of a  "Low GI"  Diet:  It will allow you to lose 4 to 8  lbs  per month if you follow it carefully.  Your goal with exercise is a minimum of 30 minutes of aerobic exercise 5 days per week (Walking does not count once it becomes easy!)    All of the foods can be found at grocery stores and in bulk at [employer].  The Atkins protein bars and shakes are available in more varieties at Target, WalMart and Lowe's Foods.     7 AM Breakfast:  Choose from the following:  Low carbohydrate Protein  Shakes (I recommend the  Premier Protein chocolate shakes,  EAS AdvantEdge "Carb Control" shakes  Or the Atkins shakes all are under 3 net carbs)     a scrambled egg/bacon/cheese burrito made with Mission's "carb balance" whole wheat tortilla  (about 10 net carbs )  Medical laboratory scientific officer (basically a quiche without the pastry crust) that is eaten cold and very convenient way to get your eggs.  8 carbs)  If you make your own protein shakes, avoid bananas and pineapple,  And use low carb greek yogurt or original /unsweetened almond or soy milk    Avoid cereal and bananas, oatmeal and cream of wheat and grits. They are loaded with carbohydrates!   10 AM: high protein snack:  Protein bar by Atkins (the snack size, under 200 cal, usually < 6 net carbs).    A stick of cheese:  Around 1 carb,  100 cal     Dannon Light n Fit [place] Yogurt  (80 cal, 8 carbs)  Other so called "protein bars" and Greek yogurts tend to be loaded with carbohydrates.  Remember, in food advertising, the word "energy" is synonymous for " carbohydrate."  Lunch:   A Sandwich using the bread choices listed, Can use any  Eggs,  lunchmeat, grilled meat or canned tuna), avocado, regular mayo/mustard  and cheese.  A Salad using blue cheese, ranch,  Goddess or vinagrette,  Avoid taco shells, croutons or "confetti"  and no "candied nuts" but regular nuts OK.   No pretzels, nabs  or chips.  Pickles and miniature sweet peppers are a good low carb alternative that provide a "crunch"  The bread is the only source of carbohydrate in a sandwich and  can be decreased by trying some of the attached alternatives to traditional loaf bread   Avoid "Low fat dressings, as well as Reyne Dumas and [employer] Foods dressings They are loaded with sugar!   3 PM/ Mid day  Snack:  Consider  1 ounce of  almonds, walnuts, pistachios, pecans, peanuts,  Macadamia nuts or a nut medley.  Avoid "granola and granola bars "  Mixed nuts are ok in moderation as long as there are no raisins,  cranberries or dried fruit.   KIND bars are OK if you get the low glycemic index variety   Try the prosciutto/mozzarella cheese sticks by Fiorruci  In deli /backery section   High protein      6 PM  Dinner:     Meat/fowl/fish with a green salad, and either broccoli, cauliflower, green beans, spinach, brussel sprouts or  Lima beans. DO NOT BREAD THE PROTEIN!!      There is a low carb pasta by Dreamfield's that is acceptable and tastes great:  only 5 digestible carbs/serving.( All grocery stores but BJs carry it ) Several ready made meals are available low carb:   Try Michel Angelo's chicken piccata or chicken or eggplant parm over low carb pasta.(Lowes and BJs)   Clifton Custard Sanchez's "Carnitas" (pulled pork, no sauce,  0 carbs) or his beef pot roast to make a dinner burrito (at BJ's)  Pesto over low carb pasta (bj's sells a good quality pesto in the center refrigerated section of the deli   Try satueeing  Roosvelt Harps with mushroooms as a good side   Green Giant makes a mashed cauliflower that tastes like mashed potatoes  Whole wheat pasta is still full of digestible carbs and  Not as low in glycemic index as Dreamfield's.   Brown rice is still rice,  So skip the rice and noodles if you eat [place] or [place] (or at least limit to 1/2 cup)  9 PM snack :    Breyer's "low carb" fudgsicle or  ice cream bar (Carb Smart line), or  Weight Watcher's ice cream bar , or another "no sugar added" ice cream;  a serving of fresh berries/cherries with whipped cream   Cheese or DANNON'S LlGHT N FIT GREEK YOGURT  8 ounces of [place] unsweetened almond/cococunut milk    Treat yourself to a parfait made with whipped cream blueberiies, walnuts and vanilla greek yogurt  Avoid bananas, pineapple, grapes  and watermelon on a regular basis because they are high in sugar.  THINK OF THEM AS DESSERT  Remember that snack Substitutions should be less than 10 NET carbs per serving and meals < 20 carbs. Remember to subtract fiber grams to get the "net carbs."  Liraglutide injection (Weight Management) What is this medicine? LIRAGLUTIDE (LIR a GLOO tide) is used to help people lose weight and maintain weight loss. It is used with a reduced-calorie diet and exercise. This medicine may be used for other purposes; ask your health care provider or pharmacist if you have questions. COMMON BRAND NAME(S): Saxenda What should I tell my health care provider before I take this medicine? They need to know if you have any of these conditions:  endocrine tumors (MEN 2) or if someone in your family had these tumors  gallbladder disease  high cholesterol  history of alcohol abuse problem  history of pancreatitis  kidney disease or if you are on dialysis  liver disease  previous swelling of the tongue, face, or lips with difficulty breathing, difficulty swallowing, hoarseness, or tightening of the throat  stomach problems  suicidal thoughts, plans, or attempt; a previous suicide attempt by you or a family member  thyroid cancer or if someone in your family had thyroid cancer  an unusual or allergic reaction to liraglutide, other medicines, foods, dyes, or preservatives  pregnant or trying to get pregnant  breast-feeding How should I use this medicine? This  medicine is for injection under the skin of your upper leg, stomach area, or upper arm. You will be taught how to prepare and give this medicine. Use exactly as directed. Take your medicine at regular intervals. Do not take it more often than directed. This drug comes with INSTRUCTIONS FOR USE. Ask your pharmacist for directions on how to use this drug. Read the information carefully. Talk to your pharmacist or health care provider if you have questions. It is important that you put your used needles and syringes in a special sharps container. Do not put them in a trash can. If you do  not have a sharps container, call your pharmacist or healthcare provider to get one. A special MedGuide will be given to you by the pharmacist with each prescription and refill. Be sure to read this information carefully each time. Talk to your pediatrician regarding the use of this medicine in children. Special care may be needed. Overdosage: If you think you have taken too much of this medicine contact a poison control center or emergency room at once. NOTE: This medicine is only for you. Do not share this medicine with others. What if I miss a dose? If you miss a dose, take it as soon as you can. If it is almost time for your next dose, take only that dose. Do not take double or extra doses. If you miss your dose for 3 days or more, call your doctor or health care professional to talk about how to restart this medicine. What may interact with this medicine?  insulin and other medicines for diabetes This list may not describe all possible interactions. Give your health care provider a list of all the medicines, herbs, non-prescription drugs, or dietary supplements you use. Also tell them if you smoke, drink alcohol, or use illegal drugs. Some items may interact with your medicine. What should I watch for while using this medicine? Visit your doctor or health care professional for regular checks on your progress. Drink  plenty of fluids while taking this medicine. Check with your doctor or health care professional if you get an attack of severe diarrhea, nausea, and vomiting. The loss of too much body fluid can make it dangerous for you to take this medicine. This medicine may affect blood sugar levels. Ask your healthcare provider if changes in diet or medicines are needed if you have diabetes. Patients and their families should watch out for worsening depression or thoughts of suicide. Also watch out for sudden changes in feelings such as feeling anxious, agitated, panicky, irritable, hostile, aggressive, impulsive, severely restless, overly excited and hyperactive, or not being able to sleep. If this happens, especially at the beginning of treatment or after a change in dose, call your health care professional. Women should inform their health care provider if they wish to become pregnant or think they might be pregnant. Losing weight while pregnant is not advised and may cause harm to the unborn child. Talk to your health care provider for more information. What side effects may I notice from receiving this medicine? Side effects that you should report to your doctor or health care professional as soon as possible:  allergic reactions like skin rash, itching or hives, swelling of the face, lips, or tongue  breathing problems  diarrhea that continues or is severe  lump or swelling on the neck  severe nausea  signs and symptoms of infection like fever or chills; cough; sore throat; pain or trouble passing urine  signs and symptoms of low blood sugar such as feeling anxious; confusion; dizziness; increased hunger; unusually weak or tired; increased sweating; shakiness; cold, clammy skin; irritable; headache; blurred vision; fast heartbeat; loss of consciousness  signs and symptoms of kidney injury like trouble passing urine or change in the amount of urine  trouble swallowing  unusual stomach upset or  pain  vomiting Side effects that usually do not require medical attention (report to your doctor or health care professional if they continue or are bothersome):  constipation  decreased appetite  diarrhea  fatigue  headache  nausea  pain, redness, or irritation at site  where injected  stomach upset  stuffy or runny nose This list may not describe all possible side effects. Call your doctor for medical advice about side effects. You may report side effects to FDA at 1-800-FDA-1088. Where should I keep my medicine? Keep out of the reach of children. Store unopened pen in a refrigerator between 2 and 8 degrees C (36 and 46 degrees F). Do not freeze or use if the medicine has been frozen. Protect from light and excessive heat. After you first use the pen, it can be stored at room temperature between 15 and 30 degrees C (59 and 86 degrees F) or in a refrigerator. Throw away your used pen after 30 days or after the expiration date, whichever comes first. Do not store your pen with the needle attached. If the needle is left on, medicine may leak from the pen. NOTE: This sheet is a summary. It may not cover all possible information. If you have questions about this medicine, talk to your doctor, pharmacist, or health care provider.  2020 Elsevier/Gold Standard (2019-08-08 21:16:59)

## 2020-07-24 NOTE — Progress Notes (Signed)
Subjective:    Patient ID: Andrea Roberson, female    DOB: 13-Feb-1986, 34 y.o.   MRN: [id number]  CC: Andrea Roberson is a 34 y.o. female who presents today for follow up.   HPI: CC: frustrated by weight and hard to loose weight.   Pre-pregnancy at 190lbs.  No formal exercise.   Works as Runner, broadcasting/film/video.  Sleeping with the exception of when children wake her up.   Skips breakfast , but may drink protein shake in the morning. At 1030 am may have grapes or yogurt. Lunch at 1pm may be a hamburger and no bread; dinner may be something like stir fry. Biggest meal is dinner. Eats something sweet every night.  Taking metformin 1000mg  bid for prediabetes as prescribed by Dr  Anxiety- follows with dr Valentino Saxon  No personal or family h/o thyroid cancer.     breastfeeding HISTORY:  Past Medical History:  Diagnosis Date  . Anxiety   . Asthma   . Bipolar disorder (HCC)   . Depression   . Frequent headaches   . GERD (gastroesophageal reflux disease)   . Gestational diabetes   . History of gestational diabetes 2016  . Hyperlipidemia   . Migraines   . Obesity (BMI 35.0-39.9 without comorbidity) 12/24/2015  . Tarsal coalition of left foot   . TMJ (dislocation of temporomandibular joint)   . UTI (urinary tract infection)   . Wears contact lenses    Past Surgical History:  Procedure Laterality Date  . CESAREAN SECTION  2015  . CESAREAN SECTION WITH BILATERAL TUBAL LIGATION N/A 11/19/2018   Procedure: CESAREAN SECTION WITH BILATERAL TUBAL LIGATION;  Surgeon: 01/18/2019, MD;  Location: [facility];  Service: Obstetrics;  Laterality: N/A;  TOB 0822 APGAR 8/8    . ESOPHAGOGASTRODUODENOSCOPY (EGD) WITH PROPOFOL N/A 08/30/2017   Procedure: ESOPHAGOGASTRODUODENOSCOPY (EGD) WITH PROPOFOL;  Surgeon: 09/01/2017, MD;  Location: [facility];  Service: Endoscopy;  Laterality: N/A;  . WISDOM TOOTH EXTRACTION     Family History  Problem Relation Age of Onset  . Heart disease  Mother   . Diabetes Mother   . Anxiety disorder Mother   . Arthritis Mother   . Skin cancer Mother   . Diabetes Father   . Skin cancer Father   . Arthritis Father   . Colon polyps Father   . Peptic Ulcer Disease Father   . Colon polyps Brother   . Colon cancer Paternal Aunt   . Heart disease Maternal Grandmother   . Colon cancer Paternal Grandfather     Allergies: Vyleesi [bremelanotide] Current Outpatient Medications on File Prior to Visit  Medication Sig Dispense Refill  . busPIRone (BUSPAR) 15 MG tablet TAKE 1 TABLET(15 MG) BY MOUTH TWICE DAILY 180 tablet 1  . cetirizine (ZYRTEC) 10 MG tablet Take 10 mg by mouth daily as needed for allergies.     [facility] FLUoxetine (PROZAC) 40 MG capsule Take 80 mg ( 2 capsules)  daily by mouth 180 capsule 1  . fluticasone (FLONASE) 50 MCG/ACT nasal spray Place 2 sprays into both nostrils daily as needed for allergies.     . hydrOXYzine (ATARAX/VISTARIL) 25 MG tablet Take 0.5-1 tablets (12.5-25 mg total) by mouth 2 (two) times daily as needed. For anxiety, sleep 60 tablet 1  . meloxicam (MOBIC) 15 MG tablet Take 1 tablet (15 mg total) by mouth daily. 30 tablet 1  . metFORMIN (GLUCOPHAGE) 500 MG tablet Take one tablet by mouth daily for one week. Then  increase to one tablet twice a day for one week.  Then two tablets twice a day. 120 tablet 6  . mometasone (ELOCON) 0.1 % lotion SMARTSIG:4 Drop(s) In Ear(s) Every Night PRN     No current facility-administered medications on file prior to visit.    Social History   Tobacco Use  . Smoking status: Former Smoker    Packs/day: 1.50    Years: 8.00    Pack years: 12.00    Types: Cigarettes    Quit date: 09/16/2012    Years since quitting: 7.8  . Smokeless tobacco: Never Used  Vaping Use  . Vaping Use: Former  Substance Use Topics  . Alcohol use: Not Currently    Comment: 2 glasswine/month  . Drug use: No    Review of Systems  Constitutional: Negative for chills and fever.  Respiratory: Negative  for cough.   Cardiovascular: Negative for chest pain and palpitations.  Gastrointestinal: Negative for nausea and vomiting.      Objective:    BP 110/76   Pulse 93   Temp 98.2 F (36.8 C)   Ht 5\' 5"  (1.651 m)   Wt 249 lb 9.6 oz (113.2 kg)   SpO2 97%   BMI 41.54 kg/m  BP Readings from Last 3 Encounters:  07/24/20 110/76  05/26/20 120/83  05/11/20 (!) 125/86   Wt Readings from Last 3 Encounters:  07/24/20 249 lb 9.6 oz (113.2 kg)  05/26/20 253 lb 4.8 oz (114.9 kg)  05/11/20 (!) 250 lb (113.4 kg)    Physical Exam Vitals reviewed.  Constitutional:      Appearance: She is well-developed.  Eyes:     Conjunctiva/sclera: Conjunctivae normal.  Cardiovascular:     Rate and Rhythm: Normal rate and regular rhythm.     Pulses: Normal pulses.     Heart sounds: Normal heart sounds.  Pulmonary:     Effort: Pulmonary effort is normal.     Breath sounds: Normal breath sounds. No wheezing, rhonchi or rales.  Skin:    General: Skin is warm and dry.  Neurological:     Mental Status: She is alert.  Psychiatric:        Speech: Speech normal.        Behavior: Behavior normal.        Thought Content: Thought content normal.        Assessment & Plan:   Problem List Items Addressed This Visit      Other   Anxiety and depression    Stable. Advised to discuss dose of prozac with Dr 05/13/20 and if she felt slight reduction in dose appropriate.  80mg  prozac likely exacerbating ability to loose weight.       Obesity (BMI 35.0-39.9 without comorbidity)    Discussed low glycemic diet and eating more protein, including breakfast. Advised to limit eating after dinner. We discussed saxenda after she has stopped breastfeeding. She declines consult with nutrition or health and wellness, dr , at this time.        Other Visit Diagnoses    Need for immunization against influenza    -  Primary   Relevant Orders   Flu Vaccine QUAD 36+ mos IM (Completed)       I am having Teosha  09-16-1990 maintain her fluticasone, cetirizine, meloxicam, hydrOXYzine, metFORMIN, mometasone, FLUoxetine, and busPIRone.   No orders of the defined types were placed in this encounter.   Return precautions given.   Risks, benefits, and alternatives of the medications and  treatment plan prescribed today were discussed, and patient expressed understanding.   Education regarding symptom management and diagnosis given to patient on AVS.  Continue to follow with Allegra Grana, FNP for routine health maintenance.   Tonnya Georga Bora and I agreed with plan.   Rennie Plowman, FNP

## 2020-07-24 NOTE — Assessment & Plan Note (Signed)
Discussed low glycemic diet and eating more protein, including breakfast. Advised to limit eating after dinner. We discussed saxenda after she has stopped breastfeeding. She declines consult with nutrition or health and wellness, dr Dalbert Garnet, at this time.

## 2020-07-24 NOTE — Assessment & Plan Note (Signed)
Stable. Advised to discuss dose of prozac with Dr Elna Breslow and if she felt slight reduction in dose appropriate.  80mg  prozac likely exacerbating ability to loose weight.

## 2020-07-28 ENCOUNTER — Other Ambulatory Visit: Payer: Self-pay

## 2020-07-28 ENCOUNTER — Ambulatory Visit: Payer: BC Managed Care – PPO | Admitting: Podiatry

## 2020-07-28 DIAGNOSIS — Q6689 Other  specified congenital deformities of feet: Secondary | ICD-10-CM | POA: Diagnosis not present

## 2020-07-28 DIAGNOSIS — M25572 Pain in left ankle and joints of left foot: Secondary | ICD-10-CM

## 2020-07-28 DIAGNOSIS — M7752 Other enthesopathy of left foot: Secondary | ICD-10-CM | POA: Diagnosis not present

## 2020-07-28 MED ORDER — MELOXICAM 15 MG PO TABS: 15.0000 mg | ORAL_TABLET | Freq: Every day | ORAL | 1 refills | Status: DC

## 2020-07-28 NOTE — Progress Notes (Signed)
   HPI: 34 year old female presenting today for follow up evaluation of left foot and ankle pain.  Patient states that she got significant amount of relief from the injections.  She recently ran out of her meloxicam and noticed that the pain slowly began to increase.  She has not worn her cam boot for a long period of time now.  She states that the pain is slowly come back over the past month.  No new complaints at this time  Past Medical History:  Diagnosis Date  . Anxiety   . Asthma   . Bipolar disorder (HCC)   . Depression   . Frequent headaches   . GERD (gastroesophageal reflux disease)   . Gestational diabetes   . History of gestational diabetes 2016  . Hyperlipidemia   . Migraines   . Obesity (BMI 35.0-39.9 without comorbidity) 12/24/2015  . Tarsal coalition of left foot   . TMJ (dislocation of temporomandibular joint)   . UTI (urinary tract infection)   . Wears contact lenses      Physical Exam: General: The patient is alert and oriented x3 in no acute distress.  Dermatology: Skin is warm, dry and supple bilateral lower extremities. Negative for open lesions or macerations.  Vascular: Palpable pedal pulses bilaterally. No edema or erythema noted. Capillary refill within normal limits.  Neurological: Epicritic and protective threshold grossly intact bilaterally.   Musculoskeletal Exam: Limited ROM with inversion and eversion of the subtalar joint. Muscle strength 5/5 in all groups bilateral.  There is some pain with inversion and eversion as well as pain on palpation of the sinus tarsi left. Pain with palpation noted to the 1st MPJ of the left hallux.   Assessment: 1. Tarsal coalition left 2. Sinus tarsitis left 3. 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 Patient evaluated.  2. Injection of 0.5 mLs Celestone Soluspan injected into the left sinus tarsi.  3. Injection of 0.5 mLs Celestone Soluspan injected into the 1st MPJ of the left foot.  4. Continue taking  Meloxicam as needed.  Refill provided 5.  Recommend good supportive sneakers 6. Return to clinic as needed.   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  Had a baby girl in February 2020.  Also has a son in elementary.  Felecia Shelling, DPM [facility]  Dr. Felecia Shelling, DPM    [address] [place], [place] [address]                Office [phone]  Fax [phone]

## 2020-08-04 ENCOUNTER — Encounter: Payer: BC Managed Care – PPO | Admitting: Obstetrics and Gynecology

## 2020-08-11 ENCOUNTER — Encounter: Payer: BC Managed Care – PPO | Admitting: Obstetrics and Gynecology

## 2020-08-13 ENCOUNTER — Encounter: Payer: Self-pay | Admitting: Family

## 2020-08-24 ENCOUNTER — Telehealth (INDEPENDENT_AMBULATORY_CARE_PROVIDER_SITE_OTHER): Payer: BC Managed Care – PPO | Admitting: Family

## 2020-08-24 ENCOUNTER — Encounter: Payer: Self-pay | Admitting: Family

## 2020-08-24 ENCOUNTER — Other Ambulatory Visit: Payer: Self-pay

## 2020-08-24 VITALS — BP 133/91 | Ht 65.0 in | Wt 244.0 lb

## 2020-08-24 DIAGNOSIS — G43909 Migraine, unspecified, not intractable, without status migrainosus: Secondary | ICD-10-CM | POA: Insufficient documentation

## 2020-08-24 DIAGNOSIS — S0300XD Dislocation of jaw, unspecified side, subsequent encounter: Secondary | ICD-10-CM

## 2020-08-24 DIAGNOSIS — G43109 Migraine with aura, not intractable, without status migrainosus: Secondary | ICD-10-CM

## 2020-08-24 MED ORDER — PROPRANOLOL HCL 20 MG PO TABS: 20.0000 mg | ORAL_TABLET | Freq: Two times a day (BID) | ORAL | 1 refills | Status: DC

## 2020-08-24 NOTE — Progress Notes (Signed)
Virtual Visit via Video Note  I connected with@  on 08/25/20 at 12:00 PM EST by a video enabled telemedicine application and verified that I am speaking with the correct person using two identifiers.  Location patient: home Location provider:work  Persons participating in the virtual visit: patient, provider  I discussed the limitations of evaluation and management by telemedicine and the availability of in person appointments. The patient expressed understanding and agreed to proceed.   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 Given flexeril qhs prn to help with sleep. She grinds her teeth. Ear and jaw pain had resolved and then couple of weeks ago returned.    Complains of right sided HA which recurred of greater intensity when jaw and ear pain returned. HAs start after lunch time. No cp, sob.   h/o migraines. Reports 'z zag clear lines' in bilateral eyes which precede HA. Current HA is not worse HA of life.  No vision loss. HA is not positional nor awaken from sleep. No congestion, fever, ear discharge.  BP one week ago 133/91, had current HA at that time.    Intermittent associated vertigo and nausea with HA. Endorses light and sound sensitivity.   Taking ibuprofen or tylenol approx 1 per week. Rest improves HA.   1 cup coffee per day.   She never feels rested.  Husband reports that both legs are 'jerking and kicking' while sleeping. She snores when sleeping.   Follows with Dr Elna Breslow, and overall feels good regimen with buspar 15mg  , atarax prn, prozac.    Stress with work and home.    ROS: See pertinent positives and negatives per HPI.    EXAM:  VITALS per patient if applicable: BP (!) 133/91   Ht 5\' 5"  (1.651 m)   Wt 244 lb (110.7 kg)   BMI 40.60 kg/m  BP Readings from Last 3 Encounters:  08/24/20 (!) 133/91  07/24/20 110/76  05/26/20 120/83   Wt Readings from Last 3  Encounters:  08/24/20 244 lb (110.7 kg)  07/24/20 249 lb 9.6 oz (113.2 kg)  05/26/20 253 lb 4.8 oz (114.9 kg)    GENERAL: alert, oriented, appears well and in no acute distress  HEENT: atraumatic, conjunttiva clear, no obvious abnormalities on inspection of external nose and ears  NECK: normal movements of the head and neck  LUNGS: on inspection no signs of respiratory distress, breathing rate appears normal, no obvious gross SOB, gasping or wheezing  CV: no obvious cyanosis  MS: moves all visible extremities without noticeable abnormality  PSYCH/NEURO: pleasant and cooperative, no obvious depression or anxiety, speech and thought processing grossly intact  ASSESSMENT AND PLAN:  Discussed the following assessment and plan:  Problem List Items Addressed This Visit      Cardiovascular and Mediastinum   Migraines - Primary    Limited by exam by nature of virtual visit. No red flags per HPI. Patient declines neuroimaging at this time. In h/o migraine, suspect TMJ, poor sleep, possibly elevated blood pressure, anxiety contributing to HA. We agreed to trial propranolol 20mg  BID and referral to neurology for HA evaluation and OSA/ sleep disorder evaluation as well. Advised her to cal dentist to discuss oral appliance to help with grinding which is exacerbating TMJ. Close follow up      Relevant Medications   propranolol (INDERAL) 20 MG tablet   Other Relevant Orders   Ambulatory referral  to Neurology     Musculoskeletal and Integument   TMJ (dislocation of temporomandibular joint)    Worsening. Advised to speak with dentist regarding oral appliance. She will continue prn flexeril.          -we discussed possible serious and likely etiologies, options for evaluation and workup, limitations of telemedicine visit vs in person visit, treatment, treatment risks and precautions. Pt prefers to treat via telemedicine empirically rather then risking or undertaking an in person visit at  this moment.  .   I discussed the assessment and treatment plan with the patient. The patient was provided an opportunity to ask questions and all were answered. The patient agreed with the plan and demonstrated an understanding of the instructions.   The patient was advised to call back or seek an in-person evaluation if the symptoms worsen or if the condition fails to improve as anticipated.   Rennie Plowman, FNP  I have spent 22 minutes with a patient including discussion of symptoms, etiology of HA ,and plan of care.

## 2020-08-25 DIAGNOSIS — S0300XA Dislocation of jaw, unspecified side, initial encounter: Secondary | ICD-10-CM | POA: Insufficient documentation

## 2020-08-25 NOTE — Assessment & Plan Note (Signed)
Worsening. Advised to speak with dentist regarding oral appliance. She will continue prn flexeril.

## 2020-08-25 NOTE — Patient Instructions (Signed)
Start propranolol Referral to neurology Let us know if you dont hear back within a week in regards to an appointment being scheduled.

## 2020-08-25 NOTE — Assessment & Plan Note (Signed)
Limited by exam by nature of virtual visit. No red flags per HPI. Patient declines neuroimaging at this time. In h/o migraine, suspect TMJ, poor sleep, possibly elevated blood pressure, anxiety contributing to HA. We agreed to trial propranolol 20mg  BID and referral to neurology for HA evaluation and OSA/ sleep disorder evaluation as well. Advised her to cal dentist to discuss oral appliance to help with grinding which is exacerbating TMJ. Close follow up

## 2020-08-26 ENCOUNTER — Encounter: Payer: Self-pay | Admitting: Neurology

## 2020-09-01 ENCOUNTER — Encounter: Payer: Self-pay | Admitting: Family

## 2020-09-02 ENCOUNTER — Encounter: Payer: Self-pay | Admitting: Family

## 2020-09-02 ENCOUNTER — Other Ambulatory Visit: Payer: Self-pay | Admitting: Family

## 2020-09-02 ENCOUNTER — Telehealth: Payer: Self-pay | Admitting: Family

## 2020-09-02 DIAGNOSIS — G43109 Migraine with aura, not intractable, without status migrainosus: Secondary | ICD-10-CM

## 2020-09-02 NOTE — Telephone Encounter (Signed)
lft vm for pt to call ofc to sch MRI. 

## 2020-09-03 NOTE — Telephone Encounter (Signed)
Pt returned your call about MRI

## 2020-09-03 NOTE — Telephone Encounter (Signed)
lft vm for pt to call ofc to sch MRI. 

## 2020-09-08 ENCOUNTER — Ambulatory Visit: Payer: BC Managed Care – PPO

## 2020-09-19 ENCOUNTER — Ambulatory Visit
Admission: RE | Admit: 2020-09-19 | Discharge: 2020-09-19 | Disposition: A | Discharge status: Home / Self Care | Payer: BC Managed Care – PPO | Source: Ambulatory Visit | Attending: Family | Admitting: Family

## 2020-09-19 ENCOUNTER — Other Ambulatory Visit: Payer: Self-pay

## 2020-09-19 DIAGNOSIS — G43109 Migraine with aura, not intractable, without status migrainosus: Secondary | ICD-10-CM | POA: Insufficient documentation

## 2020-09-19 MED ORDER — GADOBUTROL 1 MMOL/ML IV SOLN
10.0000 mL | Freq: Once | INTRAVENOUS | Status: AC | PRN
  Administered 2020-09-19: 10 mL via INTRAVENOUS

## 2020-09-30 ENCOUNTER — Other Ambulatory Visit: Payer: Self-pay

## 2020-09-30 MED ORDER — MELOXICAM 15 MG PO TABS: 15.0000 mg | ORAL_TABLET | Freq: Every day | ORAL | 3 refills | Status: DC

## 2020-09-30 NOTE — Telephone Encounter (Signed)
Pharmacy refill request for Meloxicam.  Per Dr. Evans, ok to refill.   Script has been sent to pharmacy 

## 2020-10-05 ENCOUNTER — Ambulatory Visit: Payer: BC Managed Care – PPO | Admitting: Neurology

## 2020-10-06 ENCOUNTER — Encounter: Payer: Self-pay | Admitting: Psychiatry

## 2020-10-06 ENCOUNTER — Telehealth (INDEPENDENT_AMBULATORY_CARE_PROVIDER_SITE_OTHER): Payer: BC Managed Care – PPO | Admitting: Psychiatry

## 2020-10-06 ENCOUNTER — Other Ambulatory Visit: Payer: Self-pay

## 2020-10-06 DIAGNOSIS — F3342 Major depressive disorder, recurrent, in full remission: Secondary | ICD-10-CM

## 2020-10-06 DIAGNOSIS — G4701 Insomnia due to medical condition: Secondary | ICD-10-CM | POA: Diagnosis not present

## 2020-10-06 DIAGNOSIS — F411 Generalized anxiety disorder: Secondary | ICD-10-CM

## 2020-10-06 MED ORDER — BUSPIRONE HCL 30 MG PO TABS: 30.0000 mg | ORAL_TABLET | Freq: Two times a day (BID) | ORAL | 1 refills | Status: DC

## 2020-10-06 MED ORDER — FLUOXETINE HCL 40 MG PO CAPS: 40.0000 mg | ORAL_CAPSULE | Freq: Every day | ORAL | 1 refills | Status: DC

## 2020-10-06 NOTE — Progress Notes (Signed)
Virtual Visit via Telephone Note  I connected with Andrea Roberson on 10/06/20 at  2:20 PM EST by telephone and verified that I am speaking with the correct person using two identifiers.  Location Provider Location : [facility] Patient Location : Home  Participants: Patient , Provider   I discussed the limitations, risks, security and privacy concerns of performing an evaluation and management service by telephone and the availability of in person appointments. I also discussed with the patient that there may be a patient responsible charge related to this service. The patient expressed understanding and agreed to proceed.   I discussed the assessment and treatment plan with the patient. The patient was provided an opportunity to ask questions and all were answered. The patient agreed with the plan and demonstrated an understanding of the instructions.   The patient was advised to call back or seek an in-person evaluation if the symptoms worsen or if the condition fails to improve as anticipated.   BH MD OP Progress Note  10/06/2020 3:36 PM Andrea Roberson  MRN:  [id number]  Chief Complaint:  Chief Complaint    Follow-up     HPI: Andrea Roberson is a 34 year old female, married, lives in [place], has a history of GAD, MDD, hyperlipidemia, insomnia was evaluated by telemedicine today.  Patient today reports she currently struggles with anxiety symptoms.  She reports she is often anxious, restless, worries about different things.  She also has irritability due to her anxiety symptoms.  She reports she constantly worries about her weight.  She reports she had a primary care provider appointment recently and was told that her antidepressants could be contributing to her weight gain.  She reports even though she is not gaining any more weight she is worried that her antidepressants are keeping her from losing weight.  She wonders whether she can try another antidepressant.  Patient denies  any suicidality, homicidality or perceptual disturbances.  She reports sleep and appetite as fair.  Patient reports she is compliant on medications.  Denies side effects.  She is on a break from work for the holidays.  Patient denies any other concerns today.  Visit Diagnosis:    ICD-10-CM   1. Generalized anxiety disorder  F41.1 FLUoxetine (PROZAC) 40 MG capsule    busPIRone (BUSPAR) 30 MG tablet  2. MDD (major depressive disorder), recurrent, in full remission (HCC)  F33.42   3. Insomnia due to medical condition  G47.01     Past Psychiatric History: I have reviewed past psychiatric history from my progress note on 05/15/2019.  Past trials of Prozac, trazodone, BuSpar, Zoloft  Past Medical History:  Past Medical History:  Diagnosis Date  . Anxiety   . Asthma   . Bipolar disorder (HCC)   . Depression   . Frequent headaches   . GERD (gastroesophageal reflux disease)   . Gestational diabetes   . History of gestational diabetes 2016  . Hyperlipidemia   . Migraines   . Obesity (BMI 35.0-39.9 without comorbidity) 12/24/2015  . Tarsal coalition of left foot   . TMJ (dislocation of temporomandibular joint)   . UTI (urinary tract infection)   . Wears contact lenses     Past Surgical History:  Procedure Laterality Date  . CESAREAN SECTION  2015  . CESAREAN SECTION WITH BILATERAL TUBAL LIGATION N/A 11/19/2018   Procedure: CESAREAN SECTION WITH BILATERAL TUBAL LIGATION;  Surgeon: Hildred Laserherry, Anika, MD;  Location: [facility];  Service: Obstetrics;  Laterality: N/A;  TOB [id number]  APGAR 8/8    . ESOPHAGOGASTRODUODENOSCOPY (EGD) WITH PROPOFOL N/A 08/30/2017   Procedure: ESOPHAGOGASTRODUODENOSCOPY (EGD) WITH PROPOFOL;  Surgeon: Toney Reil, MD;  Location: [facility];  Service: Endoscopy;  Laterality: N/A;  . WISDOM TOOTH EXTRACTION      Family Psychiatric History: I have reviewed family psychiatric history from my progress note on 05/15/2019  Family History:  Family History   Problem Relation Age of Onset  . Heart disease Mother   . Diabetes Mother   . Anxiety disorder Mother   . Arthritis Mother   . Skin cancer Mother   . Diabetes Father   . Skin cancer Father   . Arthritis Father   . Colon polyps Father   . Peptic Ulcer Disease Father   . Colon polyps Brother   . Colon cancer Paternal Aunt   . Heart disease Maternal Grandmother   . Colon cancer Paternal Grandfather     Social History: Reviewed social history from my progress note from 05/15/2019 Social History   Socioeconomic History  . Marital status: Married    Spouse name: Not on file  . Number of children: Not on file  . Years of education: Not on file  . Highest education level: Not on file  Occupational History  . Not on file  Tobacco Use  . Smoking status: Former Smoker    Packs/day: 1.50    Years: 8.00    Pack years: 12.00    Types: Cigarettes    Quit date: 09/16/2012    Years since quitting: 8.0  . Smokeless tobacco: Never Used  Vaping Use  . Vaping Use: Former  Substance and Sexual Activity  . Alcohol use: Not Currently    Comment: 2 glasswine/month  . Drug use: No  . Sexual activity: Yes    Birth control/protection: None, Surgical    Comment: tubal  Other Topics Concern  . Not on file  Social History Narrative   Married   5 yo son , 5 month daughter   [employer], broadcasting/film/video- pre K      Social Determinants of Health   Financial Resource Strain: Not on file  Food Insecurity: Not on file  Transportation Needs: Not on file  Physical Activity: Not on file  Stress: Not on file  Social Connections: Not on file    Allergies:  Allergies  Allergen Reactions  . Vyleesi [Bremelanotide] Itching, Nausea And Vomiting and Other (See Comments)    Flushing    Metabolic Disorder Labs: Lab Results  Component Value Date   HGBA1C 5.9 (H) 04/08/2020   No results found for: PROLACTIN Lab Results  Component Value Date   CHOL 254 (H) 04/08/2020   TRIG 278 (H) 04/08/2020   HDL 39 (L)  04/08/2020   CHOLHDL 6.5 (H) 04/08/2020   VLDL 34.8 02/19/2018   LDLCALC 163 (H) 04/08/2020   LDLCALC 126 (H) 04/04/2019   Lab Results  Component Value Date   TSH 0.90 05/29/2019   TSH 1.180 05/08/2018    Therapeutic Level Labs: No results found for: LITHIUM No results found for: VALPROATE No components found for:  CBMZ  Current Medications: Current Outpatient Medications  Medication Sig Dispense Refill  . busPIRone (BUSPAR) 30 MG tablet Take 1 tablet (30 mg total) by mouth 2 (two) times daily. 60 tablet 1  . cetirizine (ZYRTEC) 10 MG tablet Take 10 mg by mouth daily as needed for allergies.     Marland Kitchen FLUoxetine (PROZAC) 40 MG capsule Take 1 capsule (40 mg total) by  mouth daily. 90 capsule 1  . fluticasone (FLONASE) 50 MCG/ACT nasal spray Place 2 sprays into both nostrils daily as needed for allergies.     . hydrOXYzine (ATARAX/VISTARIL) 25 MG tablet Take 0.5-1 tablets (12.5-25 mg total) by mouth 2 (two) times daily as needed. For anxiety, sleep 60 tablet 1  . meloxicam (MOBIC) 15 MG tablet Take 1 tablet (15 mg total) by mouth daily. 30 tablet 3  . metFORMIN (GLUCOPHAGE) 500 MG tablet Take one tablet by mouth daily for one week. Then increase to one tablet twice a day for one week.  Then two tablets twice a day. 120 tablet 6  . mometasone (ELOCON) 0.1 % lotion SMARTSIG:4 Drop(s) In Ear(s) Every Night PRN    . propranolol (INDERAL) 20 MG tablet Take 1 tablet (20 mg total) by mouth 2 (two) times daily. 120 tablet 1   No current facility-administered medications for this visit.     Musculoskeletal: Strength & Muscle Tone: UTA Gait & Station: UTA Patient leans: N/A  Psychiatric Specialty Exam: Review of Systems  Psychiatric/Behavioral: The patient is nervous/anxious.   All other systems reviewed and are negative.   currently breastfeeding.There is no height or weight on file to calculate BMI.  General Appearance: UTA  Eye Contact:  UTA  Speech:  Clear and Coherent  Volume:   Normal  Mood:  Anxious  Affect:  UTA  Thought Process:  Goal Directed and Descriptions of Associations: Intact  Orientation:  Full (Time, Place, and Person)  Thought Content: Logical   Suicidal Thoughts:  No  Homicidal Thoughts:  No  Memory:  Immediate;   Fair Recent;   Fair Remote;   Fair  Judgement:  Fair  Insight:  Fair  Psychomotor Activity:  UTA  Concentration:  Concentration: Fair and Attention Span: Fair  Recall:  [employer] of Knowledge: Fair  Language: Fair  Akathisia:  No  Handed:  Right  AIMS (if indicated): UTA  Assets:  Communication Skills Desire for Improvement Housing Intimacy Social Support Talents/Skills Transportation  ADL's:  Intact  Cognition: WNL  Sleep:  Fair   Screenings: GAD-7   Flowsheet Row Video Visit from 10/06/2020 in [facility] Office Visit from 04/04/2019 in [facility]  Total GAD-7 Score 13 19    PHQ2-9   Flowsheet Row Video Visit from 10/06/2020 in [facility] Office Visit from 07/24/2020 in [facility] [place] Office Visit from 06/19/2019 in [facility] Office Visit from 04/24/2019 in [facility] [place] Office Visit from 04/04/2019 in [facility]  PHQ-2 Total Score 2 1 3 5 4   PHQ-9 Total Score 9 5 12 18 16        Assessment and Plan: Andrea Roberson is a 34 year old female, has a history of GAD, insomnia, hyperlipidemia was evaluated by telemedicine today.  Patient is biologically predisposed given her family history.  Patient with psychosocial stressors of mother's early dementia, death of her father, current pandemic, job related stressors, having a toddler.  Patient continues to struggle with anxiety however is worried about adverse side effects of fluoxetine.  Discussed plan as noted below.  Plan GAD-unstable Reduce Prozac to 40 mg p.o. daily since she is worried about side effect of weight  gain. Increase BuSpar to 30 mg p.o. twice daily. Hydroxyzine 25 mg p.o. nightly as needed for anxiety attacks  MDD in remission Prozac as prescribed BuSpar increased as discussed above.  Bereavement-improving Continue CBT with [facility] as needed  Insomnia-stable Continue sleep hygiene techniques  Discussed with patient that majority of antidepressants/psychotropic medications can contribute to some kind of weight gain.  Patient advised to make use of diet controlled, having regular exercise routine-which she does not currently have, for better weight control.  She will also have a discussion with primary care provider for referral to weight loss clinic if she continues to struggle.  Follow-up in clinic in 4 weeks or sooner if needed.  I have spent atleast 20 minutes non face to face  with patient today. More than 50 % of the time was spent for preparing to see the patient ( e.g., review of test, records ), obtaining and to review and separately obtained history , ordering medications and test ,psychoeducation and supportive psychotherapy and care coordination,as well as documenting clinical information in electronic health record. This note was generated in part or whole with voice recognition software. Voice recognition is usually quite accurate but there are transcription errors that can and very often do occur. I apologize for any typographical errors that were not detected and corrected.       Jomarie Longs, MD 10/07/2020, 5:34 PM

## 2020-10-13 ENCOUNTER — Telehealth: Payer: BC Managed Care – PPO | Admitting: Psychiatry

## 2020-10-21 ENCOUNTER — Encounter: Payer: Self-pay | Admitting: Family

## 2020-10-22 ENCOUNTER — Other Ambulatory Visit: Payer: Self-pay | Admitting: Family

## 2020-10-22 DIAGNOSIS — T753XXD Motion sickness, subsequent encounter: Secondary | ICD-10-CM

## 2020-10-22 MED ORDER — SCOPOLAMINE 1 MG/3DAYS TD PT72: 1.0000 | MEDICATED_PATCH | TRANSDERMAL | 0 refills | Status: DC

## 2020-11-10 ENCOUNTER — Encounter: Payer: Self-pay | Admitting: Psychiatry

## 2020-11-10 ENCOUNTER — Other Ambulatory Visit: Payer: Self-pay

## 2020-11-10 ENCOUNTER — Telehealth (INDEPENDENT_AMBULATORY_CARE_PROVIDER_SITE_OTHER): Payer: BC Managed Care – PPO | Admitting: Psychiatry

## 2020-11-10 DIAGNOSIS — F411 Generalized anxiety disorder: Secondary | ICD-10-CM

## 2020-11-10 DIAGNOSIS — F3342 Major depressive disorder, recurrent, in full remission: Secondary | ICD-10-CM

## 2020-11-10 DIAGNOSIS — G4701 Insomnia due to medical condition: Secondary | ICD-10-CM

## 2020-11-10 NOTE — Progress Notes (Signed)
Virtual Visit via Video Note  I connected with Andrea Roberson on 11/10/20 at  4:40 PM EST by a video enabled telemedicine application and verified that I am speaking with the correct person using two identifiers.  Location Provider Location : [facility] Patient Location : Home  Participants: Patient , Provider    I discussed the limitations of evaluation and management by telemedicine and the availability of in person appointments. The patient expressed understanding and agreed to proceed.   I discussed the assessment and treatment plan with the patient. The patient was provided an opportunity to ask questions and all were answered. The patient agreed with the plan and demonstrated an understanding of the instructions.   The patient was advised to call back or seek an in-person evaluation if the symptoms worsen or if the condition fails to improve as anticipated.   BH MD OP Progress Note  11/10/2020 5:20 PM Andrea Roberson  MRN:  [id number]  Chief Complaint:  Chief Complaint    Follow-up     HPI: Andrea Roberson is a 35 year old female, married, lives in [place], has a history of GAD, MDD, hyperlipidemia, insomnia was evaluated by telemedicine today.  Patient today reports she does have situational stressors which are anxiety provoking.  She reports anxiety symptoms of feeling nervous, heart palpitations which last for a few minutes.  However she reports she is able to cope with it.  It does not consume her and she is able to function well.  She reports sleep is overall okay.  She is compliant on medications as prescribed.  She continues to be in psychotherapy sessions and reports therapy sessions as beneficial.  Patient reports it was the death anniversary of her father recently which also could have contributed to anxiety.  She however reports she feels better now.  Patient denies any suicidality, homicidality or perceptual disturbances.  Patient denies any other  concerns today.  Visit Diagnosis:    ICD-10-CM   1. Generalized anxiety disorder  F41.1   2. MDD (major depressive disorder), recurrent, in full remission (HCC)  F33.42   3. Insomnia due to medical condition  G47.01     Past Psychiatric History: I have reviewed past psychiatric history from my progress note on 05/15/2019.  Past trials of Prozac, trazodone, BuSpar, Zoloft  Past Medical History:  Past Medical History:  Diagnosis Date  . Anxiety   . Asthma   . Bipolar disorder (HCC)   . Depression   . Frequent headaches   . GERD (gastroesophageal reflux disease)   . Gestational diabetes   . History of gestational diabetes 2016  . Hyperlipidemia   . Migraines   . Obesity (BMI 35.0-39.9 without comorbidity) 12/24/2015  . Tarsal coalition of left foot   . TMJ (dislocation of temporomandibular joint)   . UTI (urinary tract infection)   . Wears contact lenses     Past Surgical History:  Procedure Laterality Date  . CESAREAN SECTION  2015  . CESAREAN SECTION WITH BILATERAL TUBAL LIGATION N/A 11/19/2018   Procedure: CESAREAN SECTION WITH BILATERAL TUBAL LIGATION;  Surgeon: Hildred Laserherry, Anika, MD;  Location: [facility];  Service: Obstetrics;  Laterality: N/A;  TOB 0822 APGAR 8/8    . ESOPHAGOGASTRODUODENOSCOPY (EGD) WITH PROPOFOL N/A 08/30/2017   Procedure: ESOPHAGOGASTRODUODENOSCOPY (EGD) WITH PROPOFOL;  Surgeon: Toney ReilVanga, Rohini Reddy, MD;  Location: [facility];  Service: Endoscopy;  Laterality: N/A;  . WISDOM TOOTH EXTRACTION      Family Psychiatric History: I have reviewed family  psychiatric history from my progress note on 05/15/2019  Family History:  Family History  Problem Relation Age of Onset  . Heart disease Mother   . Diabetes Mother   . Anxiety disorder Mother   . Arthritis Mother   . Skin cancer Mother   . Diabetes Father   . Skin cancer Father   . Arthritis Father   . Colon polyps Father   . Peptic Ulcer Disease Father   . Colon polyps Brother   . Colon cancer  Paternal Aunt   . Heart disease Maternal Grandmother   . Colon cancer Paternal Grandfather     Social History: I have reviewed social history from my progress note on 05/15/2019 Social History   Socioeconomic History  . Marital status: Married    Spouse name: Not on file  . Number of children: Not on file  . Years of education: Not on file  . Highest education level: Not on file  Occupational History  . Not on file  Tobacco Use  . Smoking status: Former Smoker    Packs/day: 1.50    Years: 8.00    Pack years: 12.00    Types: Cigarettes    Quit date: 09/16/2012    Years since quitting: 8.1  . Smokeless tobacco: Never Used  Vaping Use  . Vaping Use: Former  Substance and Sexual Activity  . Alcohol use: Not Currently    Comment: 2 glasswine/month  . Drug use: No  . Sexual activity: Yes    Birth control/protection: None, Surgical    Comment: tubal  Other Topics Concern  . Not on file  Social History Narrative   Married   5 yo son , 5 month daughter   [employer], broadcasting/film/video- pre K      Social Determinants of Health   Financial Resource Strain: Not on file  Food Insecurity: Not on file  Transportation Needs: Not on file  Physical Activity: Not on file  Stress: Not on file  Social Connections: Not on file    Allergies:  Allergies  Allergen Reactions  . Vyleesi [Bremelanotide] Itching, Nausea And Vomiting and Other (See Comments)    Flushing    Metabolic Disorder Labs: Lab Results  Component Value Date   HGBA1C 5.9 (H) 04/08/2020   No results found for: PROLACTIN Lab Results  Component Value Date   CHOL 254 (H) 04/08/2020   TRIG 278 (H) 04/08/2020   HDL 39 (L) 04/08/2020   CHOLHDL 6.5 (H) 04/08/2020   VLDL 34.8 02/19/2018   LDLCALC 163 (H) 04/08/2020   LDLCALC 126 (H) 04/04/2019   Lab Results  Component Value Date   TSH 0.90 05/29/2019   TSH 1.180 05/08/2018    Therapeutic Level Labs: No results found for: LITHIUM No results found for: VALPROATE No components  found for:  CBMZ  Current Medications: Current Outpatient Medications  Medication Sig Dispense Refill  . busPIRone (BUSPAR) 30 MG tablet Take 1 tablet (30 mg total) by mouth 2 (two) times daily. 60 tablet 1  . cetirizine (ZYRTEC) 10 MG tablet Take 10 mg by mouth daily as needed for allergies.     Marland Kitchen FLUoxetine (PROZAC) 40 MG capsule Take 1 capsule (40 mg total) by mouth daily. 90 capsule 1  . fluticasone (FLONASE) 50 MCG/ACT nasal spray Place 2 sprays into both nostrils daily as needed for allergies.     . hydrOXYzine (ATARAX/VISTARIL) 25 MG tablet Take 0.5-1 tablets (12.5-25 mg total) by mouth 2 (two) times daily as needed. For anxiety, sleep  60 tablet 1  . meloxicam (MOBIC) 15 MG tablet Take 1 tablet (15 mg total) by mouth daily. 30 tablet 3  . metFORMIN (GLUCOPHAGE) 500 MG tablet Take one tablet by mouth daily for one week. Then increase to one tablet twice a day for one week.  Then two tablets twice a day. 120 tablet 6  . mometasone (ELOCON) 0.1 % lotion SMARTSIG:4 Drop(s) In Ear(s) Every Night PRN    . propranolol (INDERAL) 20 MG tablet Take 1 tablet (20 mg total) by mouth 2 (two) times daily. 120 tablet 1  . scopolamine (TRANSDERM-SCOP) 1 MG/3DAYS Place 1 patch (1.5 mg total) onto the skin every 3 (three) days. 10 patch 0   No current facility-administered medications for this visit.     Musculoskeletal: Strength & Muscle Tone: UTA Gait & Station: UTA Patient leans: N/A  Psychiatric Specialty Exam: Review of Systems  Psychiatric/Behavioral: The patient is nervous/anxious.   All other systems reviewed and are negative.   currently breastfeeding.There is no height or weight on file to calculate BMI.  General Appearance: Casual  Eye Contact:  Fair  Speech:  Clear and Coherent  Volume:  Normal  Mood:  Anxious coping well  Affect:  Congruent  Thought Process:  Goal Directed and Descriptions of Associations: Intact  Orientation:  Full (Time, Place, and Person)  Thought Content:  Logical   Suicidal Thoughts:  No  Homicidal Thoughts:  No  Memory:  Immediate;   Fair Recent;   Fair Remote;   Fair  Judgement:  Fair  Insight:  Fair  Psychomotor Activity:  Normal  Concentration:  Concentration: Fair and Attention Span: Fair  Recall:  [employer] of Knowledge: Fair  Language: Fair  Akathisia:  No  Handed:  Right  AIMS (if indicated): UTA  Assets:  Communication Skills Desire for Improvement Housing Social Support  ADL's:  Intact  Cognition: WNL  Sleep:  Fair   Screenings: GAD-7   Flowsheet Row Video Visit from 10/06/2020 in [facility] Office Visit from 04/04/2019 in [facility]  Total GAD-7 Score 13 19    PHQ2-9   Flowsheet Row Video Visit from 10/06/2020 in [facility] Office Visit from 07/24/2020 in [facility] [place] Office Visit from 06/19/2019 in [facility] Office Visit from 04/24/2019 in [facility] [place] Office Visit from 04/04/2019 in [facility]  PHQ-2 Total Score 2 1 3 5 4   PHQ-9 Total Score 9 5 12 18 16        Assessment and Plan: Andrea Roberson is a 35 year old female, has a history of GAD, insomnia, hyperlipidemia was evaluated by telemedicine today.  Patient is biologically predisposed given her family history.  Patient with psychosocial stressors of mother's early dementia, death of her father, current pandemic, job related stressors, having a toddler.  Patient is currently coping with her anxiety symptoms and continues to be compliant on medications.  Plan as noted below.  Plan GAD-improving Prozac 40 mg p.o. daily.  Dosage reduced due to concerns of weight gain. BuSpar 30 mg p.o. twice daily Hydroxyzine 25 mg p.o. nightly as needed for anxiety attacks.  MDD in remission Prozac as prescribed BuSpar 30 mg p.o. twice daily  Bereavement-improving Continue CBT with [facility] as  needed.  Insomnia-stable Continue sleep hygiene techniques.  Follow-up in clinic in 2 to 3 months or sooner if needed.  I have spent atleast 20 minutes face to face by video with patient today. More than  50 % of the time was spent for preparing to see the patient ( e.g., review of test, records ),  ordering medications and test ,psychoeducation and supportive psychotherapy and care coordination,as well as documenting clinical information in electronic health record. This note was generated in part or whole with voice recognition software. Voice recognition is usually quite accurate but there are transcription errors that can and very often do occur. I apologize for any typographical errors that were not detected and corrected.      Jomarie Longs, MD 11/11/2020, 12:34 PM

## 2020-12-03 NOTE — Progress Notes (Signed)
NEUROLOGY CONSULTATION NOTE  Andrea Roberson MRN: [id number] DOB: 12-Dec-1985  Referring provider: Rennie Plowman, FNP Primary care provider: Rennie Plowman, FNP  Reason for consult:  migraines   Subjective:  Andrea Roberson is a 35 year old right-handed female with Bipolar disorder, depression, anxiety, TMJ dysfunction and migraines who presents for migraines.  History supplemented by referring provider's note.  Onset:  Off and on since childhood.  More severe over the past 6 or 7 months Location:  Right sided Quality:  Sharp/stabbing Intensity:  Mostly moderate, sometimes severe.   Aura:  no Prodrome: no Postdrome:  Usually no but otherwise may have lethargy for a day Associated symptoms:  Nausea, photophobia, phonophobia, sees spots.  She denies vomiting, associated unilateral numbness or weakness. Duration:  All day Frequency:  At least once a week, severe ones occur once a month Frequency of abortive medication: once a week Triggers:  Allergy, stress Relieving factors:  Closing eyes, sleep Activity:  aggravates  MRI of brain with and without contrast on 09/19/2020 personally reviewed and was normal.  Current NSAIDS/analgesics:  Ibuprofen (helps ease the headache), Mobic (ankle pain) Current triptans:  none Current ergotamine:  none Current anti-emetic:  none Current muscle relaxants:  none Current Antihypertensive medications:  Propranolol 20mg  BID Current Antidepressant medications:  Fluoxetine 40mg  daily Current Anticonvulsant medications:  none Current anti-CGRP:  none Current Vitamins/Herbal/Supplements:  none Current Antihistamines/Decongestants:  Hydroxyzine, Zyrtec, Flonase Other therapy:  none Hormone/birth control:  none Other medications:  Buspar  Past NSAIDS/analgesics: none Past abortive triptans:  none Past abortive ergotamine:  none Past muscle relaxants:  none Past anti-emetic:  none Past antihypertensive medications:  none Past  antidepressant medications:  trazodone Past anticonvulsant medications:  none Past anti-CGRP:  none Past vitamins/Herbal/Supplements:  none Past antihistamines/decongestants:  none Other past therapies:  none  Caffeine:  1 cup coffee daily Diet:  1 gallon water daily.  Skips meals Exercise:  routine Depression:  Yes, controlled; Anxiety:  Yes.   Other pain:  Intermittent ankle pain Sleep hygiene:  insomnia Family history of headache:  no      PAST MEDICAL HISTORY: Past Medical History:  Diagnosis Date  . Anxiety   . Asthma   . Bipolar disorder (HCC)   . Depression   . Frequent headaches   . GERD (gastroesophageal reflux disease)   . Gestational diabetes   . History of gestational diabetes 2016  . Hyperlipidemia   . Migraines   . Obesity (BMI 35.0-39.9 without comorbidity) 12/24/2015  . Tarsal coalition of left foot   . TMJ (dislocation of temporomandibular joint)   . UTI (urinary tract infection)   . Wears contact lenses     PAST SURGICAL HISTORY: Past Surgical History:  Procedure Laterality Date  . CESAREAN SECTION  2015  . CESAREAN SECTION WITH BILATERAL TUBAL LIGATION N/A 11/19/2018   Procedure: CESAREAN SECTION WITH BILATERAL TUBAL LIGATION;  Surgeon: 02/23/2016, MD;  Location: [facility];  Service: Obstetrics;  Laterality: N/A;  TOB 0822 APGAR 8/8    . ESOPHAGOGASTRODUODENOSCOPY (EGD) WITH PROPOFOL N/A 08/30/2017   Procedure: ESOPHAGOGASTRODUODENOSCOPY (EGD) WITH PROPOFOL;  Surgeon: Hildred Laser, MD;  Location: [facility];  Service: Endoscopy;  Laterality: N/A;  . WISDOM TOOTH EXTRACTION      MEDICATIONS: Current Outpatient Medications on File Prior to Visit  Medication Sig Dispense Refill  . busPIRone (BUSPAR) 30 MG tablet Take 1 tablet (30 mg total) by mouth 2 (two) times daily. 60 tablet 1  . cetirizine (ZYRTEC)  10 MG tablet Take 10 mg by mouth daily as needed for allergies.     Marland Kitchen FLUoxetine (PROZAC) 40 MG capsule Take 1 capsule (40 mg  total) by mouth daily. 90 capsule 1  . fluticasone (FLONASE) 50 MCG/ACT nasal spray Place 2 sprays into both nostrils daily as needed for allergies.     . hydrOXYzine (ATARAX/VISTARIL) 25 MG tablet Take 0.5-1 tablets (12.5-25 mg total) by mouth 2 (two) times daily as needed. For anxiety, sleep 60 tablet 1  . meloxicam (MOBIC) 15 MG tablet Take 1 tablet (15 mg total) by mouth daily. 30 tablet 3  . metFORMIN (GLUCOPHAGE) 500 MG tablet Take one tablet by mouth daily for one week. Then increase to one tablet twice a day for one week.  Then two tablets twice a day. 120 tablet 6  . mometasone (ELOCON) 0.1 % lotion SMARTSIG:4 Drop(s) In Ear(s) Every Night PRN    . propranolol (INDERAL) 20 MG tablet Take 1 tablet (20 mg total) by mouth 2 (two) times daily. 120 tablet 1  . scopolamine (TRANSDERM-SCOP) 1 MG/3DAYS Place 1 patch (1.5 mg total) onto the skin every 3 (three) days. 10 patch 0   No current facility-administered medications on file prior to visit.    ALLERGIES: Allergies  Allergen Reactions  . Vyleesi [Bremelanotide] Itching, Nausea And Vomiting and Other (See Comments)    Flushing    FAMILY HISTORY: Family History  Problem Relation Age of Onset  . Heart disease Mother   . Diabetes Mother   . Anxiety disorder Mother   . Arthritis Mother   . Skin cancer Mother   . Diabetes Father   . Skin cancer Father   . Arthritis Father   . Colon polyps Father   . Peptic Ulcer Disease Father   . Colon polyps Brother   . Colon cancer Paternal Aunt   . Heart disease Maternal Grandmother   . Colon cancer Paternal Grandfather     SOCIAL HISTORY: Social History   Socioeconomic History  . Marital status: Married    Spouse name: Not on file  . Number of children: Not on file  . Years of education: Not on file  . Highest education level: Not on file  Occupational History  . Not on file  Tobacco Use  . Smoking status: Former Smoker    Packs/day: 1.50    Years: 8.00    Pack years: 12.00     Types: Cigarettes    Quit date: 09/16/2012    Years since quitting: 8.2  . Smokeless tobacco: Never Used  Vaping Use  . Vaping Use: Former  Substance and Sexual Activity  . Alcohol use: Not Currently    Comment: 2 glasswine/month  . Drug use: No  . Sexual activity: Yes    Birth control/protection: None, Surgical    Comment: tubal  Other Topics Concern  . Not on file  Social History Narrative   Married   5 yo son , 5 month daughter   [employer], broadcasting/film/video- pre K      Social Determinants of Health   Financial Resource Strain: Not on file  Food Insecurity: Not on file  Transportation Needs: Not on file  Physical Activity: Not on file  Stress: Not on file  Social Connections: Not on file  Intimate Partner Violence: Not on file    Objective:  Blood pressure 115/72, pulse 81, height 5\' 5"  (1.651 m), weight 256 lb (116.1 kg), SpO2 97 %, currently breastfeeding. General: No acute distress.  Patient  appears well-groomed.   Head:  Normocephalic/atraumatic Eyes:  fundi examined but not visualized Neck: supple, no paraspinal tenderness, full range of motion Back: No paraspinal tenderness Heart: regular rate and rhythm Lungs: Clear to auscultation bilaterally. Vascular: No carotid bruits. Neurological Exam: Mental status: alert and oriented to person, place, and time, recent and remote memory intact, fund of knowledge intact, attention and concentration intact, speech fluent and not dysarthric, language intact. Cranial nerves: CN I: not tested CN II: pupils equal, round and reactive to light, visual fields intact CN III, IV, VI:  full range of motion, no nystagmus, no ptosis CN V: facial sensation intact. CN VII: upper and lower face symmetric CN VIII: hearing intact CN IX, X: gag intact, uvula midline CN XI: sternocleidomastoid and trapezius muscles intact CN XII: tongue midline Bulk & Tone: normal, no fasciculations. Motor:  muscle strength 5/5 throughout Sensation:  Pinprick,  temperature and vibratory sensation intact. Deep Tendon Reflexes:  2+ throughout,  toes downgoing.   Finger to nose testing:  Without dysmetria.   Heel to shin:  Without dysmetria.   Gait:  Normal station and stride.  Romberg negative.  Assessment/Plan:   1.  Migraine without aura, without status migrainosus, not intractable 2.  Anxiety and depression  1.  Migraine prevention:  Change propranolol to ER and increase to 60mg  daily.  If no improvement in 4 weeks, increase to 80mg  daily 2.  Migraine rescue:  Sumatriptan 100mg  3.  Limit use of pain relievers to no more than 2 days out of week to prevent risk of rebound or medication-overuse headache. 4.  Keep headache diary 5.  Continue and optimize management of depression and anxiety 6.  Follow up 4 to 6 months    Thank you for allowing me to take part in the care of this patient.  , DO  CC: , FNP

## 2020-12-04 ENCOUNTER — Other Ambulatory Visit: Payer: Self-pay

## 2020-12-04 ENCOUNTER — Encounter: Payer: Self-pay | Admitting: Neurology

## 2020-12-04 ENCOUNTER — Ambulatory Visit: Payer: BC Managed Care – PPO | Admitting: Neurology

## 2020-12-04 VITALS — BP 115/72 | HR 81 | Ht 65.0 in | Wt 256.0 lb

## 2020-12-04 DIAGNOSIS — F419 Anxiety disorder, unspecified: Secondary | ICD-10-CM | POA: Diagnosis not present

## 2020-12-04 DIAGNOSIS — F32A Depression, unspecified: Secondary | ICD-10-CM

## 2020-12-04 DIAGNOSIS — G43009 Migraine without aura, not intractable, without status migrainosus: Secondary | ICD-10-CM

## 2020-12-04 MED ORDER — SUMATRIPTAN SUCCINATE 100 MG PO TABS: ORAL_TABLET | ORAL | 5 refills | Status: AC

## 2020-12-04 MED ORDER — PROPRANOLOL HCL ER 60 MG PO CP24: 60.0000 mg | ORAL_CAPSULE | Freq: Every day | ORAL | 5 refills | Status: DC

## 2020-12-04 NOTE — Patient Instructions (Signed)
  1. Change propranolol to extended release form and increase to 60mg  daily.  If no improvement in headaches in 4 weeks, contact me. 2. Take sumatriptan 100mg  at earliest onset of headache.  May repeat dose once in 2 hours if needed.  Maximum 2 tablets in 24 hours. 3. Limit use of pain relievers to no more than 2 days out of the week.  These medications include acetaminophen, NSAIDs (ibuprofen/Advil/Motrin, naproxen/Aleve, triptans (Imitrex/sumatriptan), Excedrin, and narcotics.  This will help reduce risk of rebound headaches. 4. Be aware of common food triggers:  - Caffeine:  coffee, black tea, cola, Mt. Dew  - Chocolate  - Dairy:  aged cheeses (brie, blue, cheddar, gouda, [place], provolone, [place], Swiss, etc), chocolate milk, buttermilk, sour cream, limit eggs and yogurt  - Nuts, peanut butter  - Alcohol  - Cereals/grains:  FRESH breads (fresh bagels, sourdough, doughnuts), yeast productions  - Processed/canned/aged/cured meats (pre-packaged deli meats, hotdogs)  - MSG/glutamate:  soy sauce, flavor enhancer, pickled/preserved/marinated foods  - Sweeteners:  aspartame (Equal, Nutrasweet).  Sugar and Splenda are okay  - Vegetables:  legumes (lima beans, lentils, snow peas, fava beans, pinto peans, peas, garbanzo beans), sauerkraut, onions, olives, pickles  - Fruit:  avocados, bananas, citrus fruit (orange, lemon, grapefruit), mango  - Other:  Frozen meals, macaroni and cheese 5. Routine exercise 6. Stay adequately hydrated (aim for 64 oz water daily) 7. Keep headache diary 8. Maintain proper stress management 9. Maintain proper sleep hygiene 10. Do not skip meals 11. Consider supplements:  magnesium citrate 400mg  daily, riboflavin 400mg  daily, coenzyme Q10 100mg  three times daily. 12.  Follow up 4 to 6 months

## 2020-12-23 ENCOUNTER — Other Ambulatory Visit: Payer: Self-pay | Admitting: Family

## 2020-12-23 DIAGNOSIS — G43109 Migraine with aura, not intractable, without status migrainosus: Secondary | ICD-10-CM

## 2020-12-23 NOTE — Telephone Encounter (Signed)
Looks like Neurology DC this medication on 12/04/20?

## 2021-01-13 ENCOUNTER — Other Ambulatory Visit: Payer: Self-pay | Admitting: Obstetrics and Gynecology

## 2021-01-19 ENCOUNTER — Other Ambulatory Visit: Payer: Self-pay

## 2021-01-19 ENCOUNTER — Telehealth (INDEPENDENT_AMBULATORY_CARE_PROVIDER_SITE_OTHER): Payer: BC Managed Care – PPO | Admitting: Psychiatry

## 2021-01-19 ENCOUNTER — Encounter: Payer: Self-pay | Admitting: Psychiatry

## 2021-01-19 DIAGNOSIS — F411 Generalized anxiety disorder: Secondary | ICD-10-CM | POA: Diagnosis not present

## 2021-01-19 DIAGNOSIS — F3342 Major depressive disorder, recurrent, in full remission: Secondary | ICD-10-CM | POA: Diagnosis not present

## 2021-01-19 DIAGNOSIS — G4701 Insomnia due to medical condition: Secondary | ICD-10-CM

## 2021-01-19 MED ORDER — BUSPIRONE HCL 30 MG PO TABS: 30.0000 mg | ORAL_TABLET | Freq: Every day | ORAL | 0 refills | Status: DC

## 2021-01-19 NOTE — Patient Instructions (Signed)
Serotonin Syndrome Serotonin is a chemical in your body (neurotransmitter) that helps to control several functions, such as:  Brain and nerve cell function.  Mood and emotions.  Memory.  Eating.  Sleeping.  Sexual activity.  Stress response. Having too much serotonin in your body can cause serotonin syndrome. This condition can be harmful to your brain and nerve cells. This can be a life-threatening condition. What are the causes? This condition may be caused by taking medicines or drugs that increase the level of serotonin in your body, such as:  Antidepressant medicines.  Migraine medicines.  Certain pain medicines.  Certain drugs, including ecstasy, LSD, cocaine, and amphetamines.  Over-the-counter cough or cold medicines that contain dextromethorphan.  Certain herbal supplements, including St. John's wort, ginseng, and nutmeg. This condition usually occurs when you take these medicines or drugs in combination, but it can also happen with a high dose of a single medicine or drug. What increases the risk? You are more likely to develop this condition if:  You just started taking a medicine or drug that increases the level of serotonin in the body.  You recently increased the dose of a medicine or drug that increases the level of serotonin in the body.  You take more than one medicine or drug that increases the level of serotonin in the body. What are the signs or symptoms? Symptoms of this condition usually start within several hours of taking a medicine or drug. Symptoms may be mild or severe. Mild symptoms include:  Sweating.  Restlessness or agitation.  Muscle twitching or stiffness.  Rapid heart rate.  Nausea and vomiting.  Diarrhea.  Headache.  Shivering or goose bumps.  Confusion. Severe symptoms include:  Irregular heartbeat.  Seizures.  Loss of consciousness.  High fever. How is this diagnosed? This condition may be diagnosed based  on:  Your medical history.  A physical exam.  Your prior use of drugs and medicines.  Blood or urine tests. These may be used to rule out other causes of your symptoms. How is this treated? The treatment for this condition depends on the severity of your symptoms.  For mild cases, stopping the medicine or drug that caused your condition is usually all that is needed.  For moderate to severe cases, treatment in a hospital may be needed to prevent or manage life-threatening symptoms. This may include medicines to control your symptoms, IV fluids, interventions to support your breathing, and treatments to control your body temperature. Follow these instructions at home: Medicines  Take over-the-counter and prescription medicines only as told by your health care provider. This is important.  Check with your health care provider before you start taking any new prescriptions, over-the-counter medicines, herbs, or supplements.  Avoid combining any medicines that can cause this condition to occur.   Lifestyle  Maintain a healthy lifestyle. ? Eat a healthy diet that includes plenty of vegetables, fruits, whole grains, low-fat dairy products, and lean protein. Do not eat a lot of foods that are high in fat, added sugars, or salt. ? Get the right amount and quality of sleep. Most adults need 7-9 hours of sleep each night. ? Make time to exercise, even if it is only for short periods of time. Most adults should exercise for at least 150 minutes each week. ? Do not drink alcohol. ? Do not use illegal drugs, and do not take medicines for reasons other than they are prescribed.   General instructions  Do not use any products that   contain nicotine or tobacco, such as cigarettes and e-cigarettes. If you need help quitting, ask your health care provider.  Keep all follow-up visits as told by your health care provider. This is important. Contact a health care provider if:  Your symptoms do not  improve or they get worse. Get help right away if you:  Have worsening confusion, severe headache, chest pain, high fever, seizures, or loss of consciousness.  Experience serious side effects of medicine, such as swelling of your face, lips, tongue, or throat.  Have serious thoughts about hurting yourself or others. These symptoms may represent a serious problem that is an emergency. Do not wait to see if the symptoms will go away. Get medical help right away. Call your local emergency services (911 in the [place] Do not drive yourself to the hospital. If you ever feel like you may hurt yourself or others, or have thoughts about taking your own life, get help right away. You can go to your nearest emergency department or call:  Your local emergency services (911 in the [place]  A suicide crisis helpline, such as the National Suicide Prevention Lifeline at 1-800-273-8255. This is open 24 hours a day. Summary  Serotonin is a brain chemical that helps to regulate the nervous system. High levels of serotonin in the body can cause serotonin syndrome, which is a very dangerous condition.  This condition may be caused by taking medicines or drugs that increase the level of serotonin in your body.  Treatment depends on the severity of your symptoms. For mild cases, stopping the medicine or drug that caused your condition is usually all that is needed.  Check with your health care provider before you start taking any new prescriptions, over-the-counter medicines, herbs, or supplements. This information is not intended to replace advice given to you by your health care provider. Make sure you discuss any questions you have with your health care provider. Document Revised: 11/10/2017 Document Reviewed: 11/10/2017 [employer] Patient Education  2021 [employer]  

## 2021-01-19 NOTE — Progress Notes (Signed)
Virtual Visit via Video Note  I connected with Andrea Roberson on 01/19/21 at  3:40 PM EDT by a video enabled telemedicine application and verified that I am speaking with the correct person using two identifiers.  Location Provider Location : [facility] Patient Location : Home  Participants: Patient , Provider    I discussed the limitations of evaluation and management by telemedicine and the availability of in person appointments. The patient expressed understanding and agreed to proceed.   I discussed the assessment and treatment plan with the patient. The patient was provided an opportunity to ask questions and all were answered. The patient agreed with the plan and demonstrated an understanding of the instructions.   The patient was advised to call back or seek an in-person evaluation if the symptoms worsen or if the condition fails to improve as anticipated.   BH MD OP Progress Note  01/19/2021 4:12 PM Andrea Roberson  MRN:  [id number]  Chief Complaint:  Chief Complaint    Family Problem; Anxiety     HPI: Andrea Roberson is a 35 year old female, married, lives in [place],, has a history of GAD, MDD, hyperlipidemia, insomnia was evaluated by telemedicine today.  Patient today reports she was not compliant with her Prozac as well as her BuSpar recently.  She reports she skipped few dosages of Prozac since she was on vacation.  She also has been taking the BuSpar only once a day and not twice a day.  She reports since then her anxiety symptoms may have worsened.  She often worries a lot, feels restless, has trouble relaxing and feels nervous.  Patient reports she however wants to give the Prozac more time and wants to take it regularly before making more medication changes.  She reports sleep is overall okay.  She reports she also has been noncompliant with psychotherapy sessions due to scheduling conflict.  She however agrees to get in touch with her therapist.  Patient  denies any suicidality, homicidality or perceptual disturbances.  Patient recently had neurology appointment for headache and was started on a higher dosage of propranolol as well as sumatriptan was added.  Patient denies any other concerns today.  Visit Diagnosis:    ICD-10-CM   1. Generalized anxiety disorder  F41.1 busPIRone (BUSPAR) 30 MG tablet  2. MDD (major depressive disorder), recurrent, in full remission (HCC)  F33.42   3. Insomnia due to medical condition  G47.01    anxiety    Past Psychiatric History: I have reviewed past psychiatric history from my progress note on 05/15/2019.  Past trials of Prozac, trazodone, BuSpar, Zoloft  Past Medical History:  Past Medical History:  Diagnosis Date  . Anxiety   . Asthma   . Bipolar disorder (HCC)   . Depression   . Frequent headaches   . GERD (gastroesophageal reflux disease)   . Gestational diabetes   . History of gestational diabetes 2016  . Hyperlipidemia   . Migraines   . Obesity (BMI 35.0-39.9 without comorbidity) 12/24/2015  . Tarsal coalition of left foot   . TMJ (dislocation of temporomandibular joint)   . UTI (urinary tract infection)   . Wears contact lenses     Past Surgical History:  Procedure Laterality Date  . CESAREAN SECTION  2015  . CESAREAN SECTION WITH BILATERAL TUBAL LIGATION N/A 11/19/2018   Procedure: CESAREAN SECTION WITH BILATERAL TUBAL LIGATION;  Surgeon: Hildred Laserherry, Anika, MD;  Location: [facility]S;  Service: Obstetrics;  Laterality: N/A;  TOB 0822 APGAR  8/8    . ESOPHAGOGASTRODUODENOSCOPY (EGD) WITH PROPOFOL N/A 08/30/2017   Procedure: ESOPHAGOGASTRODUODENOSCOPY (EGD) WITH PROPOFOL;  Surgeon: Toney Reil, MD;  Location: [facility];  Service: Endoscopy;  Laterality: N/A;  . WISDOM TOOTH EXTRACTION      Family Psychiatric History: I have reviewed family psychiatric history from my progress note on 05/15/2019  Family History:  Family History  Problem Relation Age of Onset  . Heart  disease Mother   . Diabetes Mother   . Anxiety disorder Mother   . Arthritis Mother   . Skin cancer Mother   . Diabetes Father   . Skin cancer Father   . Arthritis Father   . Colon polyps Father   . Peptic Ulcer Disease Father   . Colon polyps Brother   . Colon cancer Paternal Aunt   . Heart disease Maternal Grandmother   . Colon cancer Paternal Grandfather     Social History: Reviewed social history from my progress note on 05/15/2019 Social History   Socioeconomic History  . Marital status: Married    Spouse name: Not on file  . Number of children: Not on file  . Years of education: Not on file  . Highest education level: Not on file  Occupational History  . Not on file  Tobacco Use  . Smoking status: Former Smoker    Packs/day: 1.50    Years: 8.00    Pack years: 12.00    Types: Cigarettes    Quit date: 09/16/2012    Years since quitting: 8.3  . Smokeless tobacco: Never Used  Vaping Use  . Vaping Use: Former  Substance and Sexual Activity  . Alcohol use: Not Currently    Comment: 2 glasswine/month  . Drug use: No  . Sexual activity: Yes    Birth control/protection: None, Surgical    Comment: tubal  Other Topics Concern  . Not on file  Social History Narrative   Married   5 yo son , 5 month daughter   Runner, broadcasting/film/video- pre K   Right handed      Social Determinants of Health   Financial Resource Strain: Not on file  Food Insecurity: Not on file  Transportation Needs: Not on file  Physical Activity: Not on file  Stress: Not on file  Social Connections: Not on file    Allergies:  Allergies  Allergen Reactions  . Vyleesi [Bremelanotide] Itching, Nausea And Vomiting and Other (See Comments)    Flushing    Metabolic Disorder Labs: Lab Results  Component Value Date   HGBA1C 5.9 (H) 04/08/2020   No results found for: PROLACTIN Lab Results  Component Value Date   CHOL 254 (H) 04/08/2020   TRIG 278 (H) 04/08/2020   HDL 39 (L) 04/08/2020   CHOLHDL 6.5 (H)  04/08/2020   VLDL 34.8 02/19/2018   LDLCALC 163 (H) 04/08/2020   LDLCALC 126 (H) 04/04/2019   Lab Results  Component Value Date   TSH 0.90 05/29/2019   TSH 1.180 05/08/2018    Therapeutic Level Labs: No results found for: LITHIUM No results found for: VALPROATE No components found for:  CBMZ  Current Medications: Current Outpatient Medications  Medication Sig Dispense Refill  . busPIRone (BUSPAR) 30 MG tablet Take 1 tablet (30 mg total) by mouth daily. 90 tablet 0  . cetirizine (ZYRTEC) 10 MG tablet Take 10 mg by mouth daily as needed for allergies.     Marland Kitchen FLUoxetine (PROZAC) 40 MG capsule Take 1 capsule (40 mg total) by  mouth daily. 90 capsule 1  . fluticasone (FLONASE) 50 MCG/ACT nasal spray Place 2 sprays into both nostrils daily as needed for allergies.     . hydrOXYzine (ATARAX/VISTARIL) 25 MG tablet Take 0.5-1 tablets (12.5-25 mg total) by mouth 2 (two) times daily as needed. For anxiety, sleep 60 tablet 1  . meloxicam (MOBIC) 15 MG tablet Take 1 tablet (15 mg total) by mouth daily. 30 tablet 3  . metFORMIN (GLUCOPHAGE) 500 MG tablet TAKE 1 TABLET BY MOUTH TWICE DAILY 60 tablet 6  . mometasone (ELOCON) 0.1 % lotion SMARTSIG:4 Drop(s) In Ear(s) Every Night PRN    . propranolol ER (INDERAL LA) 60 MG 24 hr capsule Take 1 capsule (60 mg total) by mouth daily. 30 capsule 5  . scopolamine (TRANSDERM-SCOP) 1 MG/3DAYS Place 1 patch (1.5 mg total) onto the skin every 3 (three) days. 10 patch 0  . SUMAtriptan (IMITREX) 100 MG tablet Take 1 tablet earliest onset of migraine.  May repeat in 2 hours if headache persists or recurs.  Maximum 2 tablets in 24 hours. 10 tablet 5   No current facility-administered medications for this visit.     Musculoskeletal: Strength & Muscle Tone: UTA Gait & Station: normal Patient leans: N/A  Psychiatric Specialty Exam: Review of Systems  Psychiatric/Behavioral: The patient is nervous/anxious.   All other systems reviewed and are negative.    currently breastfeeding.There is no height or weight on file to calculate BMI.  General Appearance: Casual  Eye Contact:  Fair  Speech:  Clear and Coherent  Volume:  Normal  Mood:  Anxious  Affect:  Congruent  Thought Process:  Goal Directed and Descriptions of Associations: Intact  Orientation:  Full (Time, Place, and Person)  Thought Content: Logical   Suicidal Thoughts:  No  Homicidal Thoughts:  No  Memory:  Immediate;   Fair Recent;   Fair Remote;   Fair  Judgement:  Fair  Insight:  Fair  Psychomotor Activity:  Normal  Concentration:  Concentration: Fair and Attention Span: Fair  Recall:  [employer] of Knowledge: Fair  Language: Fair  Akathisia:  No  Handed:  Right  AIMS (if indicated): UTA  Assets:  Communication Skills Desire for Improvement Housing Social Support  ADL's:  Intact  Cognition: WNL  Sleep:  Fair   Screenings: GAD-7   Flowsheet Row Video Visit from 01/19/2021 in [facility] Video Visit from 10/06/2020 in [facility] Office Visit from 04/04/2019 in [facility]  Total GAD-7 Score 10 13 19     PHQ2-9   Flowsheet Row Video Visit from 01/19/2021 in [facility] Video Visit from 10/06/2020 in [facility] Office Visit from 07/24/2020 in [facility] [place] Office Visit from 06/19/2019 in [facility] Office Visit from 04/24/2019 in [facility] [place]  PHQ-2 Total Score 0 2 1 3 5   PHQ-9 Total Score -- 9 5 12 18        Assessment and Plan: CHABELI BARSAMIAN is a 35 year old female, has a history of GAD, insomnia, hyperlipidemia was evaluated by telemedicine today.  Patient is biologically predisposed given her family history.  Patient with psychosocial stressors of mother's early dementia, death of her father, current pandemic, job related stressors.  Patient however reports she has been  noncompliant with her medications which may have worsened her anxiety symptoms.  Discussed plan as noted below.  Plan GAD-unstable GAD 7 equals 10 Continue Prozac 40 mg p.o. daily.  Encouraged compliance.  Dosage was reduced in the past due to weight gain concerns. Continue BuSpar however reduce the dosage to 30 mg p.o. daily since she has been taking only once a day dosage at this time. Continue C[facility] with [facility] at [facility].  Patient encouraged compliance. When she gets the medication more time after she starts taking it every day we will reevaluate in 3 to 4 weeks.  MDD intromission PHQ 2 today equals 0 Prozac as prescribed BuSpar 30 mg p.o. daily  Insomnia-stable Will monitor closely  Discussed drug to drug interaction with sumatriptan which was recently started by neurology, with antidepressants, Including serotonin syndrome.  Follow-up in clinic in 3 to 4 weeks or sooner if needed.  This note was generated in part or whole with voice recognition software. Voice recognition is usually quite accurate but there are transcription errors that can and very often do occur. I apologize for any typographical errors that were not detected and corrected.        Jomarie Longs, MD 01/19/2021, 4:12 PM

## 2021-02-17 ENCOUNTER — Other Ambulatory Visit: Payer: Self-pay

## 2021-02-17 ENCOUNTER — Encounter: Payer: Self-pay | Admitting: Psychiatry

## 2021-02-17 ENCOUNTER — Telehealth (INDEPENDENT_AMBULATORY_CARE_PROVIDER_SITE_OTHER): Payer: BC Managed Care – PPO | Admitting: Psychiatry

## 2021-02-17 DIAGNOSIS — G4701 Insomnia due to medical condition: Secondary | ICD-10-CM

## 2021-02-17 DIAGNOSIS — F411 Generalized anxiety disorder: Secondary | ICD-10-CM | POA: Diagnosis not present

## 2021-02-17 DIAGNOSIS — F3342 Major depressive disorder, recurrent, in full remission: Secondary | ICD-10-CM

## 2021-02-17 MED ORDER — FLUOXETINE HCL 40 MG PO CAPS: 40.0000 mg | ORAL_CAPSULE | Freq: Every day | ORAL | 1 refills | Status: DC

## 2021-02-17 NOTE — Progress Notes (Signed)
Virtual Visit via Video Note  I connected with Andrea Roberson on 02/17/21 at  4:00 PM EDT by a video enabled telemedicine application and verified that I am speaking with the correct person using two identifiers.  Location Provider Location : [facility] Patient Location : Home  Participants: Patient , Provider   I discussed the limitations of evaluation and management by telemedicine and the availability of in person appointments. The patient expressed understanding and agreed to proceed.   I discussed the assessment and treatment plan with the patient. The patient was provided an opportunity to ask questions and all were answered. The patient agreed with the plan and demonstrated an understanding of the instructions.   The patient was advised to call back or seek an in-person evaluation if the symptoms worsen or if the condition fails to improve as anticipated.   BH MD OP Progress Note  02/17/2021 4:15 PM Brightyn KIMYA MCCAHILL  MRN:  [id number]  Chief Complaint:  Chief Complaint    Follow-up; Depression; Anxiety     HPI: Andrea Roberson is a 35 year old female, married, lives in [place], has a history of GAD, MDD, hyperlipidemia, insomnia was evaluated by telemedicine today.  Patient today reports she is taking her medications more regularly.  She reports she is able to manage her anxiety symptoms better.  She reports she currently does not have any side effects.  She is sleeping well.  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  Patient reports work is going well.  She is currently scheduled to restart psychotherapy sessions again.  She looks forward to that.  She denies any suicidality, homicidality or perceptual disturbances.  Patient denies any other concerns today.  Visit Diagnosis:    ICD-10-CM   1. Generalized anxiety disorder  F41.1 FLUoxetine (PROZAC) 40 MG capsule  2. MDD (major depressive disorder), recurrent, in full remission (HCC)  F33.42   3.  Insomnia due to medical condition  G47.01    anxiety    Past Psychiatric History: I have reviewed past psychiatric history from progress note on 05/15/2019.  Past trials of Prozac, trazodone, BuSpar, and Zoloft  Past Medical History:  Past Medical History:  Diagnosis Date  . Anxiety   . Asthma   . Bipolar disorder (HCC)   . Depression   . Frequent headaches   . GERD (gastroesophageal reflux disease)   . Gestational diabetes   . History of gestational diabetes 2016  . Hyperlipidemia   . Migraines   . Obesity (BMI 35.0-39.9 without comorbidity) 12/24/2015  . Tarsal coalition of left foot   . TMJ (dislocation of temporomandibular joint)   . UTI (urinary tract infection)   . Wears contact lenses     Past Surgical History:  Procedure Laterality Date  . CESAREAN SECTION  2015  . CESAREAN SECTION WITH BILATERAL TUBAL LIGATION N/A 11/19/2018   Procedure: CESAREAN SECTION WITH BILATERAL TUBAL LIGATION;  Surgeon: Hildred Laser, MD;  Location: [facility];  Service: Obstetrics;  Laterality: N/A;  TOB 0822 APGAR 8/8    . ESOPHAGOGASTRODUODENOSCOPY (EGD) WITH PROPOFOL N/A 08/30/2017   Procedure: ESOPHAGOGASTRODUODENOSCOPY (EGD) WITH PROPOFOL;  Surgeon: Toney Reil, MD;  Location[facility];  Service: Endoscopy;  Laterality: N/A;  . WISDOM TOOTH EXTRACTION      Family Psychiatric History: I have reviewed family psychiatric history from progress note on 05/15/2019  Family History:  Family History  Problem Relation Age of Onset  . Heart disease Mother   . Diabetes Mother   .  Anxiety disorder Mother   . Arthritis Mother   . Skin cancer Mother   . Diabetes Father   . Skin cancer Father   . Arthritis Father   . Colon polyps Father   . Peptic Ulcer Disease Father   . Colon polyps Brother   . Colon cancer Paternal Aunt   . Heart disease Maternal Grandmother   . Colon cancer Paternal Grandfather     Social History: I have reviewed social history from progress note on  05/15/2019 Social History   Socioeconomic History  . Marital status: Married    Spouse name: Not on file  . Number of children: Not on file  . Years of education: Not on file  . Highest education level: Not on file  Occupational History  . Not on file  Tobacco Use  . Smoking status: Former Smoker    Packs/day: 1.50    Years: 8.00    Pack years: 12.00    Types: Cigarettes    Quit date: 09/16/2012    Years since quitting: 8.4  . Smokeless tobacco: Never Used  Vaping Use  . Vaping Use: Former  Substance and Sexual Activity  . Alcohol use: Not Currently    Comment: 2 glasswine/month  . Drug use: No  . Sexual activity: Yes    Birth control/protection: None, Surgical    Comment: tubal  Other Topics Concern  . Not on file  Social History Narrative   Married   5 yo son , 5 month daughter   Runner, broadcasting/film/video- pre K   Right handed      Social Determinants of Health   Financial Resource Strain: Not on file  Food Insecurity: Not on file  Transportation Needs: Not on file  Physical Activity: Not on file  Stress: Not on file  Social Connections: Not on file    Allergies:  Allergies  Allergen Reactions  . Vyleesi [Bremelanotide] Itching, Nausea And Vomiting and Other (See Comments)    Flushing    Metabolic Disorder Labs: Lab Results  Component Value Date   HGBA1C 5.9 (H) 04/08/2020   No results found for: PROLACTIN Lab Results  Component Value Date   CHOL 254 (H) 04/08/2020   TRIG 278 (H) 04/08/2020   HDL 39 (L) 04/08/2020   CHOLHDL 6.5 (H) 04/08/2020   VLDL 34.8 02/19/2018   LDLCALC 163 (H) 04/08/2020   LDLCALC 126 (H) 04/04/2019   Lab Results  Component Value Date   TSH 0.90 05/29/2019   TSH 1.180 05/08/2018    Therapeutic Level Labs: No results found for: LITHIUM No results found for: VALPROATE No components found for:  CBMZ  Current Medications: Current Outpatient Medications  Medication Sig Dispense Refill  . busPIRone (BUSPAR) 30 MG tablet Take 1 tablet  (30 mg total) by mouth daily. 90 tablet 0  . cetirizine (ZYRTEC) 10 MG tablet Take 10 mg by mouth daily as needed for allergies.     Marland Kitchen FLUoxetine (PROZAC) 40 MG capsule Take 1 capsule (40 mg total) by mouth daily. 90 capsule 1  . fluticasone (FLONASE) 50 MCG/ACT nasal spray Place 2 sprays into both nostrils daily as needed for allergies.     . hydrOXYzine (ATARAX/VISTARIL) 25 MG tablet Take 0.5-1 tablets (12.5-25 mg total) by mouth 2 (two) times daily as needed. For anxiety, sleep 60 tablet 1  . meloxicam (MOBIC) 15 MG tablet Take 1 tablet (15 mg total) by mouth daily. 30 tablet 3  . metFORMIN (GLUCOPHAGE) 500 MG tablet TAKE 1 TABLET BY  MOUTH TWICE DAILY 60 tablet 6  . mometasone (ELOCON) 0.1 % lotion SMARTSIG:4 Drop(s) In Ear(s) Every Night PRN    . propranolol ER (INDERAL LA) 60 MG 24 hr capsule Take 1 capsule (60 mg total) by mouth daily. 30 capsule 5  . scopolamine (TRANSDERM-SCOP) 1 MG/3DAYS Place 1 patch (1.5 mg total) onto the skin every 3 (three) days. 10 patch 0  . SUMAtriptan (IMITREX) 100 MG tablet Take 1 tablet earliest onset of migraine.  May repeat in 2 hours if headache persists or recurs.  Maximum 2 tablets in 24 hours. 10 tablet 5   No current facility-administered medications for this visit.     Musculoskeletal: Strength & Muscle Tone: UTA Gait & Station: UTA Patient leans: N/A  Psychiatric Specialty Exam: Review of Systems  Psychiatric/Behavioral: Negative for agitation, behavioral problems, confusion, decreased concentration, dysphoric mood, hallucinations, self-injury, sleep disturbance and suicidal ideas. The patient is not nervous/anxious and is not hyperactive.   All other systems reviewed and are negative.   not currently breastfeeding.There is no height or weight on file to calculate BMI.  General Appearance: Casual  Eye Contact:  Fair  Speech:  Clear and Coherent  Volume:  Normal  Mood:  Euthymic  Affect:  Congruent  Thought Process:  Goal Directed and  Descriptions of Associations: Intact  Orientation:  Full (Time, Place, and Person)  Thought Content: Logical   Suicidal Thoughts:  No  Homicidal Thoughts:  No  Memory:  Immediate;   Fair Recent;   Fair Remote;   Fair  Judgement:  Fair  Insight:  Fair  Psychomotor Activity:  Normal  Concentration:  Concentration: Fair and Attention Span: Fair  Recall:  [employer] of Knowledge: Fair  Language: Fair  Akathisia:  No  Handed:  Right  AIMS (if indicated): UTA  Assets:  Communication Skills Desire for Improvement Housing Social Support  ADL's:  Intact  Cognition: WNL  Sleep:  Fair   Screenings: GAD-7   Flowsheet Row Video Visit from 02/17/2021 in [facility] Video Visit from 01/19/2021 in [facility] Video Visit from 10/06/2020 in [facility] Office Visit from 04/04/2019 in [facility]  Total GAD-7 Score 8 10 13 19     PHQ2-9   Flowsheet Row Video Visit from 01/19/2021 in [facility] Video Visit from 10/06/2020 in [facility] Office Visit from 07/24/2020 in [facility] [place] Office Visit from 06/19/2019 in [facility] Office Visit from 04/24/2019 in [facility] [place]  PHQ-2 Total Score 0 2 1 3 5   PHQ-9 Total Score -- 9 5 12 18        Assessment and Plan: Andrea Roberson is a 35 year old female, has a history of GAD, insomnia, hyperlipidemia was evaluated by telemedicine today.  Patient is biologically predisposed given family history.  Patient with psychosocial stressors of mother's medical problems, current pandemic.  Patient however is currently making progress.  Plan GAD-improving GAD-7 today equals 8 Continue Prozac 40 mg p.o. daily. Continue BuSpar 30 mg p.o. daily Patient to restart CBT with [facility] ,she has upcoming appointment.  MDD-remission Prozac as prescribed BuSpar  30 mg p.o. daily  Insomnia-stable Will monitor closely  Follow-up in clinic in 3 months or sooner if needed.  This note was generated in part or whole with voice recognition software. Voice recognition is usually quite accurate but there are transcription errors that can and very often do occur. I apologize for any  typographical errors that were not detected and corrected.       Jomarie LongsSaramma Loucille Takach, MD 02/18/2021, 3:02 PM

## 2021-04-08 ENCOUNTER — Other Ambulatory Visit: Payer: Self-pay

## 2021-04-08 ENCOUNTER — Ambulatory Visit (INDEPENDENT_AMBULATORY_CARE_PROVIDER_SITE_OTHER): Payer: BC Managed Care – PPO | Admitting: Obstetrics and Gynecology

## 2021-04-08 ENCOUNTER — Encounter: Payer: Self-pay | Admitting: Obstetrics and Gynecology

## 2021-04-08 VITALS — BP 113/77 | HR 72 | Ht 65.0 in | Wt 255.1 lb

## 2021-04-08 DIAGNOSIS — F3181 Bipolar II disorder: Secondary | ICD-10-CM

## 2021-04-08 DIAGNOSIS — E785 Hyperlipidemia, unspecified: Secondary | ICD-10-CM

## 2021-04-08 DIAGNOSIS — Z01419 Encounter for gynecological examination (general) (routine) without abnormal findings: Secondary | ICD-10-CM | POA: Diagnosis not present

## 2021-04-08 DIAGNOSIS — Z8632 Personal history of gestational diabetes: Secondary | ICD-10-CM

## 2021-04-08 DIAGNOSIS — L659 Nonscarring hair loss, unspecified: Secondary | ICD-10-CM

## 2021-04-08 DIAGNOSIS — R7303 Prediabetes: Secondary | ICD-10-CM

## 2021-04-08 DIAGNOSIS — Z6841 Body Mass Index (BMI) 40.0 and over, adult: Secondary | ICD-10-CM

## 2021-04-08 NOTE — Patient Instructions (Signed)
Breast Self-Awareness Breast self-awareness is knowing how your breasts look and feel. Doing breast self-awareness is important. It allows you to catch a breast problem early while it is still small and can be treated. All women should do breast self-awareness, including women who have had breast implants. Tell your doctorif you notice a change in your breasts. What you need: A mirror. A well-lit room. How to do a breast self-exam A breast self-exam is one way to learn what is normal for your breasts and tocheck for changes. To do a breast self-exam: Look for changes  Take off all the clothes above your waist. Stand in front of a mirror in a room with good lighting. Put your hands on your hips. Push your hands down. Look at your breasts and nipples in the mirror to see if one breast or nipple looks different from the other. Check to see if: The shape of one breast is different. The size of one breast is different. There are wrinkles, dips, and bumps in one breast and not the other. Look at each breast for changes in the skin, such as: Redness. Scaly areas. Look for changes in your nipples, such as: Liquid around the nipples. Bleeding. Dimpling. Redness. A change in where the nipples are.  Feel for changes  Lie on your back on the floor. Feel each breast. To do this, follow these steps: Pick a breast to feel. Put the arm closest to that breast above your head. Use your other arm to feel the nipple area of your breast. Feel the area with the pads of your three middle fingers by making small circles with your fingers. For the first circle, press lightly. For the second circle, press harder. For the third circle, press even harder. Keep making circles with your fingers at the different pressures as you move down your breast. Stop when you feel your ribs. Move your fingers a little toward the center of your body. Start making circles with your fingers again, this time going up until  you reach your collarbone. Keep making up-and-down circles until you reach your armpit. Remember to keep using the three pressures. Feel the other breast in the same way. Sit or stand in the tub or shower. With soapy water on your skin, feel each breast the same way you did in step 2 when you were lying on the floor.  Write down what you find Writing down what you find can help you remember what to tell your doctor. Write down: What is normal for each breast. Any changes you find in each breast, including: The kind of changes you find. Whether you have pain. Size and location of any lumps. When you last had your menstrual period. General tips Check your breasts every month. If you are breastfeeding, the best time to check your breasts is after you feed your baby or after you use a breast pump. If you get menstrual periods, the best time to check your breasts is 5-7 days after your menstrual period is over. With time, you will become comfortable with the self-exam, and you will begin to know if there are changes in your breasts. Contact a doctor if you: See a change in the shape or size of your breasts or nipples. See a change in the skin of your breast or nipples, such as red or scaly skin. Have fluid coming from your nipples that is not normal. Find a lump or thick area that was not there before. Have pain in   your breasts. Have any concerns about your breast health. Summary Breast self-awareness includes looking for changes in your breasts, as well as feeling for changes within your breasts. Breast self-awareness should be done in front of a mirror in a well-lit room. You should check your breasts every month. If you get menstrual periods, the best time to check your breasts is 5-7 days after your menstrual period is over. Let your doctor know of any changes you see in your breasts, including changes in size, changes on the skin, pain or tenderness, or fluid from your nipples that is  not normal. This information is not intended to replace advice given to you by your health care provider. Make sure you discuss any questions you have with your healthcare provider. Document Revised: 05/22/2018 Document Reviewed: 05/22/2018 Elsevier Patient Education  2022 Elsevier Inc. Preventive Care 21-39 Years Old, Female Preventive care refers to lifestyle choices and visits with your health care provider that can promote health and wellness. This includes: A yearly physical exam. This is also called an annual wellness visit. Regular dental and eye exams. Immunizations. Screening for certain conditions. Healthy lifestyle choices, such as: Eating a healthy diet. Getting regular exercise. Not using drugs or products that contain nicotine and tobacco. Limiting alcohol use. What can I expect for my preventive care visit? Physical exam Your health care provider may check your: Height and weight. These may be used to calculate your BMI (body mass index). BMI is a measurement that tells if you are at a healthy weight. Heart rate and blood pressure. Body temperature. Skin for abnormal spots. Counseling Your health care provider may ask you questions about your: Past medical problems. Family's medical history. Alcohol, tobacco, and drug use. Emotional well-being. Home life and relationship well-being. Sexual activity. Diet, exercise, and sleep habits. Work and work environment. Access to firearms. Method of birth control. Menstrual cycle. Pregnancy history. What immunizations do I need?  Vaccines are usually given at various ages, according to a schedule. Your health care provider will recommend vaccines for you based on your age, medicalhistory, and lifestyle or other factors, such as travel or where you work. What tests do I need?  Blood tests Lipid and cholesterol levels. These may be checked every 5 years starting at age 20. Hepatitis C test. Hepatitis B  test. Screening Diabetes screening. This is done by checking your blood sugar (glucose) after you have not eaten for a while (fasting). STD (sexually transmitted disease) testing, if you are at risk. BRCA-related cancer screening. This may be done if you have a family history of breast, ovarian, tubal, or peritoneal cancers. Pelvic exam and Pap test. This may be done every 3 years starting at age 21. Starting at age 30, this may be done every 5 years if you have a Pap test in combination with an HPV test. Talk with your health care provider about your test results, treatment options,and if necessary, the need for more tests. Follow these instructions at home: Eating and drinking  Eat a healthy diet that includes fresh fruits and vegetables, whole grains, lean protein, and low-fat dairy products. Take vitamin and mineral supplements as recommended by your health care provider. Do not drink alcohol if: Your health care provider tells you not to drink. You are pregnant, may be pregnant, or are planning to become pregnant. If you drink alcohol: Limit how much you have to 0-1 drink a day. Be aware of how much alcohol is in your drink. In the [place] one   drink equals one 12 oz bottle of beer (355 mL), one 5 oz glass of wine (148 mL), or one 1 oz glass of hard liquor (44 mL).  Lifestyle Take daily care of your teeth and gums. Brush your teeth every morning and night with fluoride toothpaste. Floss one time each day. Stay active. Exercise for at least 30 minutes 5 or more days each week. Do not use any products that contain nicotine or tobacco, such as cigarettes, e-cigarettes, and chewing tobacco. If you need help quitting, ask your health care provider. Do not use drugs. If you are sexually active, practice safe sex. Use a condom or other form of protection to prevent STIs (sexually transmitted infections). If you do not wish to become pregnant, use a form of birth control. If you plan to become  pregnant, see your health care provider for a prepregnancy visit. Find healthy ways to cope with stress, such as: Meditation, yoga, or listening to music. Journaling. Talking to a trusted person. Spending time with friends and family. Safety Always wear your seat belt while driving or riding in a vehicle. Do not drive: If you have been drinking alcohol. Do not ride with someone who has been drinking. When you are tired or distracted. While texting. Wear a helmet and other protective equipment during sports activities. If you have firearms in your house, make sure you follow all gun safety procedures. Seek help if you have been physically or sexually abused. What's next? Go to your health care provider once a year for an annual wellness visit. Ask your health care provider how often you should have your eyes and teeth checked. Stay up to date on all vaccines. This information is not intended to replace advice given to you by your health care provider. Make sure you discuss any questions you have with your healthcare provider. Document Revised: 05/31/2020 Document Reviewed: 06/14/2018 Elsevier Patient Education  2022 Elsevier Inc.  

## 2021-04-08 NOTE — Progress Notes (Signed)
GYNECOLOGY ANNUAL PHYSICAL EXAM PROGRESS NOTE  Subjective:    Andrea Roberson is a 35 y.o. P83 married female who presents for an annual exam.The patient is sexually active.   The patient participates in regular exercise: yes (2-3 times weekly, jog/walking 2 miles, recently resumed within the past month). Has the patient ever been transfused or tattooed?: no. The patient reports that there is not domestic violence in her life.   The patient has the following complaints today:  1. Cites issues with hair loss and hair not growing as fast. Losing clumps of hair. This has been ongoing for ~ 7-8 months.  2. Issues with her weight.  Working out daily for the past month and has still gained 5 lbs (walking/jogging 1 mile or lifting weights).  Eating is overall healthy, uses meal prep service ([employer]).    Gynecologic History Patient's last menstrual period was 03/21/2021.   Menarche age: 2.  Reports regular menses.  Contraception: tubal ligation  History of STI's: Denies Last Pap: 04/08/2020. Results were: normal.  Denies h/o abnormal pap smears.   OB History  Gravida Para Term Preterm AB Living  2 2 2  0 0 2  SAB IAB Ectopic Multiple Live Births  0 0 0 0 2    # Outcome Date GA Lbr Len/2nd Weight Sex Delivery Anes PTL Lv  2 Term 11/19/18 [redacted]w[redacted]d  7 lb 6.9 oz (3.37 kg) F CS-LTranv Spinal  LIV     Name: Diem,GIRL Savayah     Apgar1: 8  Apgar5: 8  1 Term 05/02/14 [redacted]w[redacted]d  7 lb 8 oz (3.402 kg) M CS-LTranv Spinal, EPI N LIV     Complications: Gestational diabetes mellitus in childbirth, insulin controlled, Excessive weight gain in pregnancy     Name: [name]     Apgar1: 8  Apgar5: 9    Past Medical History:  Diagnosis Date   Anxiety    Asthma    Bipolar disorder (HCC)    Depression    Frequent headaches    GERD (gastroesophageal reflux disease)    Gestational diabetes    History of gestational diabetes 2016   Hyperlipidemia    Migraines    Obesity (BMI 35.0-39.9 without  comorbidity) 12/24/2015   Tarsal coalition of left foot    TMJ (dislocation of temporomandibular joint)    UTI (urinary tract infection)    Wears contact lenses      Past Surgical History:  Procedure Laterality Date   CESAREAN SECTION  2015   CESAREAN SECTION WITH BILATERAL TUBAL LIGATION N/A 11/19/2018   Procedure: CESAREAN SECTION WITH BILATERAL TUBAL LIGATION;  Surgeon: 01/18/2019, MD;  Location: [facility];  Service: Obstetrics;  Laterality: N/A;  TOB 0822 APGAR 8/8     ESOPHAGOGASTRODUODENOSCOPY (EGD) WITH PROPOFOL N/A 08/30/2017   Procedure: ESOPHAGOGASTRODUODENOSCOPY (EGD) WITH PROPOFOL;  Surgeon: 09/01/2017, MD;  Location: [facility];  Service: Endoscopy;  Laterality: N/A;   WISDOM TOOTH EXTRACTION      Family History  Problem Relation Age of Onset   Heart disease Mother    Diabetes Mother    Anxiety disorder Mother    Arthritis Mother    Skin cancer Mother    Diabetes Father    Skin cancer Father    Arthritis Father    Colon polyps Father    Peptic Ulcer Disease Father    Colon polyps Brother    Colon cancer Paternal Aunt    Heart disease Maternal Grandmother    Colon  cancer Paternal Grandfather     Social History   Socioeconomic History   Marital status: Married    Spouse name: Not on file   Number of children: Not on file   Years of education: Not on file   Highest education level: Not on file  Occupational History   Not on file  Tobacco Use   Smoking status: Former    Packs/day: 1.50    Years: 8.00    Pack years: 12.00    Types: Cigarettes    Quit date: 09/16/2012    Years since quitting: 8.5   Smokeless tobacco: Never  Vaping Use   Vaping Use: Former  Substance and Sexual Activity   Alcohol use: Not Currently    Comment: 2 glasswine/month   Drug use: No   Sexual activity: Yes    Birth control/protection: None, Surgical    Comment: tubal  Other Topics Concern   Not on file  Social History Narrative   Married   5 yo son ,  5 month daughter   [employer], broadcasting/film/videoTeacher- pre K   Right handed      Social Determinants of Health   Financial Resource Strain: Not on file  Food Insecurity: Not on file  Transportation Needs: Not on file  Physical Activity: Not on file  Stress: Not on file  Social Connections: Not on file  Intimate Partner Violence: Not on file    Current Outpatient Medications on File Prior to Visit  Medication Sig Dispense Refill   busPIRone (BUSPAR) 30 MG tablet Take 1 tablet (30 mg total) by mouth daily. 90 tablet 0   cetirizine (ZYRTEC) 10 MG tablet Take 10 mg by mouth daily as needed for allergies.      FLUoxetine (PROZAC) 40 MG capsule Take 1 capsule (40 mg total) by mouth daily. 90 capsule 1   fluticasone (FLONASE) 50 MCG/ACT nasal spray Place 2 sprays into both nostrils daily as needed for allergies.      hydrOXYzine (ATARAX/VISTARIL) 25 MG tablet Take 0.5-1 tablets (12.5-25 mg total) by mouth 2 (two) times daily as needed. For anxiety, sleep 60 tablet 1   meloxicam (MOBIC) 15 MG tablet Take 1 tablet (15 mg total) by mouth daily. 30 tablet 3   metFORMIN (GLUCOPHAGE) 500 MG tablet TAKE 1 TABLET BY MOUTH TWICE DAILY 60 tablet 6   mometasone (ELOCON) 0.1 % lotion SMARTSIG:4 Drop(s) In Ear(s) Every Night PRN     propranolol ER (INDERAL LA) 60 MG 24 hr capsule Take 1 capsule (60 mg total) by mouth daily. 30 capsule 5   scopolamine (TRANSDERM-SCOP) 1 MG/3DAYS Place 1 patch (1.5 mg total) onto the skin every 3 (three) days. 10 patch 0   SUMAtriptan (IMITREX) 100 MG tablet Take 1 tablet earliest onset of migraine.  May repeat in 2 hours if headache persists or recurs.  Maximum 2 tablets in 24 hours. 10 tablet 5   No current facility-administered medications on file prior to visit.    Allergies  Allergen Reactions   Vyleesi [Bremelanotide] Itching, Nausea And Vomiting and Other (See Comments)    Flushing     Review of Systems Constitutional: negative for chills, fatigue, fevers and sweats. Positive for  weight gain Eyes: negative for irritation, redness and visual disturbance Ears, nose, mouth, throat, and face: negative for hearing loss, nasal congestion, snoring and tinnitus Respiratory: negative for asthma, cough, sputum Cardiovascular: negative for chest pain, dyspnea, exertional chest pressure/discomfort, irregular heart beat, palpitations and syncope Gastrointestinal: negative for abdominal pain, change in  bowel habits, nausea and vomiting Genitourinary: negative for abnormal menstrual periods, genital lesions, sexual problems and vaginal discharge, dysuria and urinary incontinence.  Integument/breast: negative for breast lump, breast tenderness and nipple discharge Hematologic/lymphatic: negative for bleeding and easy bruising Musculoskeletal:negative for back pain and muscle weakness Neurological: negative for dizziness, headaches, vertigo and weakness Endocrine: negative for diabetic symptoms including polydipsia, polyuria and skin dryness Allergic/Immunologic: negative for hay fever and urticaria   Skin: Negative for rashes or lesions. Positive for hair loss.      Objective:  Blood pressure 113/77, pulse 72, height 5\' 5"  (1.651 m), weight 255 lb 1.6 oz (115.7 kg), last menstrual period 03/21/2021. Body mass index is 42.45 kg/m.  General Appearance:    Alert, cooperative, no distress, appears stated age; morbidly obese  Head:    Normocephalic, without obvious abnormality, atraumatic  Eyes:    PERRL, conjunctiva/corneas clear, EOM's intact, both eyes  Ears:    Normal external ear canals, both ears  Nose:   Nares normal, septum midline, mucosa normal, no drainage or sinus tenderness  Throat:   Lips, mucosa, and tongue normal; teeth and gums normal  Neck:   Supple, symmetrical, trachea midline, no adenopathy; thyroid: no enlargement/tenderness/nodules; no carotid bruit or JVD  Back:     Symmetric, no curvature, ROM normal, no CVA tenderness  Lungs:     Clear to auscultation  bilaterally, respirations unlabored  Chest Wall:    No tenderness or deformity   Heart:    Regular rate and rhythm, S1 and S2 normal, no murmur, rub or gallop  Breast Exam:    No tenderness, masses, or nipple abnormality  Abdomen:     Soft, non-tender, bowel sounds active all four quadrants, no masses, no organomegaly.  Well-healed Pfannenstiel incision  Genitalia:    Pelvic:external genitalia normal, vagina without lesions, discharge, or tenderness, rectovaginal septum  normal. Cervix normal in appearance, no cervical motion tenderness, no adnexal masses or tenderness.  Uterus normal size, shape, mobile, regular contours, nontender.  Rectal:    Normal external sphincter.  No hemorrhoids appreciated. Internal exam not done.   Extremities:   Extremities normal, atraumatic, no cyanosis or edema  Pulses:   2+ and symmetric all extremities  Skin:   Skin color, texture, turgor normal, no rashes or lesions  Lymph nodes:   Cervical, supraclavicular, and axillary nodes normal  Neurologic:   CNII-XII intact, normal strength, sensation and reflexes throughout     Labs:  Lab Results  Component Value Date   WBC 7.5 04/08/2020   HGB 13.5 04/08/2020   HCT 41.0 04/08/2020   MCV 84 04/08/2020   PLT 293 04/08/2020    Lab Results  Component Value Date   CREATININE 0.69 04/08/2020   BUN 12 04/08/2020   NA 143 04/08/2020   K 4.4 04/08/2020   CL 106 04/08/2020   CO2 24 04/08/2020   Lab Results  Component Value Date   ALT 26 04/08/2020   AST 24 04/08/2020   ALKPHOS 136 (H) 04/08/2020   BILITOT <0.2 04/08/2020    Lab Results  Component Value Date   CHOL 254 (H) 04/08/2020   HDL 39 (L) 04/08/2020   LDLCALC 163 (H) 04/08/2020   TRIG 278 (H) 04/08/2020   CHOLHDL 6.5 (H) 04/08/2020   Lab Results  Component Value Date   TSH 0.90 05/29/2019     Assessment:   Healthy female exam. Morbid obesity Dyslipidemia Prediabetes Bipolar disorder  Plan:  - Labs: Labs ordered.  - Pap smear  up  to date.  - Breast self exam technique reviewed and patient encouraged to perform self-exam monthly. - Contraception: tubal ligation  - Discussed healthy lifestyle modifications.  Notes that she has been exercising and modifying her diet, however but not making much progress and has actually gained a few pounds. Discussed other medications that could be helpful for managing weight loss if interested.  - Dyslipidemia.  No meds currently. Will reassess. Continue to encourage low cholesterol diet and physical activity.  - Bipolar disorder currently managed with Prozac, Vistaril and Buspar. -  Unclear cause of hair loss. Will order labs to assess for vitamin deficiency.   - COVID vaccination: has completed series. Eligible to receive booster.  - Follow up in 1 year for annual exam.    Hildred Laser, MD [facility]

## 2021-04-08 NOTE — Progress Notes (Signed)
Pt present for annual exam. Pt stated that she was doing well.  

## 2021-04-09 ENCOUNTER — Encounter: Payer: BC Managed Care – PPO | Admitting: Obstetrics and Gynecology

## 2021-04-10 LAB — CBC
Hematocrit: 40.3 % (ref 34.0–46.6)
Hemoglobin: 13.6 g/dL (ref 11.1–15.9)
MCH: 28.6 pg (ref 26.6–33.0)
MCHC: 33.7 g/dL (ref 31.5–35.7)
MCV: 85 fL (ref 79–97)
Platelets: 305 10*3/uL (ref 150–450)
RBC: 4.76 x10E6/uL (ref 3.77–5.28)
RDW: 14 % (ref 11.7–15.4)
WBC: 9.3 10*3/uL (ref 3.4–10.8)

## 2021-04-10 LAB — VITAMIN D 25 HYDROXY (VIT D DEFICIENCY, FRACTURES): Vit D, 25-Hydroxy: 28.4 ng/mL — ABNORMAL LOW (ref 30.0–100.0)

## 2021-04-10 LAB — COMPREHENSIVE METABOLIC PANEL
ALT: 29 IU/L (ref 0–32)
AST: 32 IU/L (ref 0–40)
Albumin/Globulin Ratio: 1.5 (ref 1.2–2.2)
Albumin: 4.4 g/dL (ref 3.8–4.8)
Alkaline Phosphatase: 113 IU/L (ref 44–121)
BUN/Creatinine Ratio: 19 (ref 9–23)
BUN: 13 mg/dL (ref 6–20)
Bilirubin Total: 0.3 mg/dL (ref 0.0–1.2)
CO2: 22 mmol/L (ref 20–29)
Calcium: 9.6 mg/dL (ref 8.7–10.2)
Chloride: 100 mmol/L (ref 96–106)
Creatinine, Ser: 0.67 mg/dL (ref 0.57–1.00)
Globulin, Total: 3 g/dL (ref 1.5–4.5)
Glucose: 86 mg/dL (ref 65–99)
Potassium: 4.2 mmol/L (ref 3.5–5.2)
Sodium: 135 mmol/L (ref 134–144)
Total Protein: 7.4 g/dL (ref 6.0–8.5)
eGFR: 117 mL/min/{1.73_m2} (ref >59)

## 2021-04-10 LAB — HEMOGLOBIN A1C
Est. average glucose Bld gHb Est-mCnc: 128 mg/dL
Hgb A1c MFr Bld: 6.1 % — ABNORMAL HIGH (ref 4.8–5.6)

## 2021-04-10 LAB — LIPID PANEL
Chol/HDL Ratio: 6.7 ratio — ABNORMAL HIGH (ref 0.0–4.4)
Cholesterol, Total: 222 mg/dL — ABNORMAL HIGH (ref 100–199)
HDL: 33 mg/dL — ABNORMAL LOW (ref >39)
LDL Chol Calc (NIH): 101 mg/dL — ABNORMAL HIGH (ref 0–99)
Triglycerides: 517 mg/dL — ABNORMAL HIGH (ref 0–149)
VLDL Cholesterol Cal: 88 mg/dL — ABNORMAL HIGH (ref 5–40)

## 2021-04-10 LAB — TSH: TSH: 1.67 u[IU]/mL (ref 0.450–4.500)

## 2021-04-10 LAB — IRON,TIBC AND FERRITIN PANEL
Ferritin: 114 ng/mL (ref 15–150)
Iron Saturation: 21 % (ref 15–55)
Iron: 73 ug/dL (ref 27–159)
Total Iron Binding Capacity: 350 ug/dL (ref 250–450)
UIBC: 277 ug/dL (ref 131–425)

## 2021-04-10 LAB — FOLATE: Folate: 13.8 ng/mL (ref >3.0)

## 2021-04-10 LAB — ZINC: Zinc: 73 ug/dL (ref 44–115)

## 2021-04-10 LAB — VITAMIN B12: Vitamin B-12: 772 pg/mL (ref 232–1245)

## 2021-04-20 MED ORDER — PHENTERMINE HCL 30 MG PO CAPS
30.0000 mg | ORAL_CAPSULE | ORAL | 0 refills | Status: DC
Start: 2021-04-20 — End: 2021-05-12

## 2021-04-20 MED ORDER — PHENTERMINE HCL 37.5 MG PO CAPS
37.5000 mg | ORAL_CAPSULE | ORAL | 0 refills | Status: DC
Start: 2021-04-20 — End: 2021-06-02

## 2021-04-20 MED ORDER — TOPIRAMATE 25 MG PO TABS: 25.0000 mg | ORAL_TABLET | Freq: Two times a day (BID) | ORAL | 5 refills | Status: DC

## 2021-04-21 ENCOUNTER — Telehealth: Payer: Self-pay

## 2021-04-21 NOTE — Telephone Encounter (Signed)
Done

## 2021-04-21 NOTE — Telephone Encounter (Signed)
Spoke to pt and scheduled her for a 5 week weight check per [facility].

## 2021-05-03 NOTE — Progress Notes (Deleted)
NEUROLOGY FOLLOW UP OFFICE NOTE  ROBECCA FULGHAM [id number]  Assessment/Plan:   Migraine without aura Anxiety and depression  Migraine prevention:  *** Migraine rescue:  *** Limit use of pain relievers to no more than 2 days out of week to prevent risk of rebound or medication-overuse headache. Keep headache diary Follow up ***   Subjective:  Jone Trinady Milewski is a 35 year old right-handed female with Bipolar disorder, depression, anxiety, TMJ dysfunction and migraines who follows up for migraines.  UPDATE: Intensity:  *** Duration:  *** Frequency:  *** Frequency of abortive medication: *** Current NSAIDS/analgesics:  Ibuprofen (helps ease the headache), Mobic (ankle pain) Current triptans:  sumatriptan 100mg  Current ergotamine:  none Current anti-emetic:  none Current muscle relaxants:  none Current Antihypertensive medications:  Propranolol ER 60mg  daily Current Antidepressant medications:  Fluoxetine 40mg  daily Current Anticonvulsant medications:  none Current anti-CGRP:  none Current Vitamins/Herbal/Supplements:  none Current Antihistamines/Decongestants:  Hydroxyzine, Zyrtec, Flonase Other therapy:  none Hormone/birth control:  none Other medications:  Buspar  Caffeine:  1 cup coffee daily Diet:  1 gallon water daily.  Skips meals Exercise:  routine Depression:  Yes, controlled; Anxiety:  Yes.   Other pain:  Intermittent ankle pain Sleep hygiene:  insomnia  HISTORY:  Onset:  Off and on since childhood.  More severe over the past 6 or 7 months Location:  Right sided Quality:  Sharp/stabbing Intensity:  Mostly moderate, sometimes severe.   Aura:  no Prodrome: no Postdrome:  Usually no but otherwise may have lethargy for a day Associated symptoms:  Nausea, photophobia, phonophobia, sees spots.  She denies vomiting, associated unilateral numbness or weakness. Duration:  All day Frequency:  At least once a week, severe ones occur once a month Frequency  of abortive medication: once a week Triggers:  Allergy, stress Relieving factors:  Closing eyes, sleep Activity:  aggravates   MRI of brain with and without contrast on 09/19/2020 was normal.    Past NSAIDS/analgesics: none Past abortive triptans:  none Past abortive ergotamine:  none Past muscle relaxants:  none Past anti-emetic:  none Past antihypertensive medications:  none Past antidepressant medications:  trazodone Past anticonvulsant medications:  none Past anti-CGRP:  none Past vitamins/Herbal/Supplements:  none Past antihistamines/decongestants:  none Other past therapies:  none    Family history of headache:  no  PAST MEDICAL HISTORY: Past Medical History:  Diagnosis Date   Anxiety    Asthma    Bipolar disorder (HCC)    Depression    Frequent headaches    GERD (gastroesophageal reflux disease)    Gestational diabetes    History of gestational diabetes 2016   Hyperlipidemia    Migraines    Obesity (BMI 35.0-39.9 without comorbidity) 12/24/2015   Tarsal coalition of left foot    TMJ (dislocation of temporomandibular joint)    UTI (urinary tract infection)    Wears contact lenses     MEDICATIONS: Current Outpatient Medications on File Prior to Visit  Medication Sig Dispense Refill   busPIRone (BUSPAR) 30 MG tablet Take 1 tablet (30 mg total) by mouth daily. 90 tablet 0   cetirizine (ZYRTEC) 10 MG tablet Take 10 mg by mouth daily as needed for allergies.      FLUoxetine (PROZAC) 40 MG capsule Take 1 capsule (40 mg total) by mouth daily. 90 capsule 1   fluticasone (FLONASE) 50 MCG/ACT nasal spray Place 2 sprays into both nostrils daily as needed for allergies.      hydrOXYzine (ATARAX/VISTARIL) 25  MG tablet Take 0.5-1 tablets (12.5-25 mg total) by mouth 2 (two) times daily as needed. For anxiety, sleep 60 tablet 1   meloxicam (MOBIC) 15 MG tablet Take 1 tablet (15 mg total) by mouth daily. 30 tablet 3   metFORMIN (GLUCOPHAGE) 500 MG tablet TAKE 1 TABLET BY MOUTH  TWICE DAILY 60 tablet 6   mometasone (ELOCON) 0.1 % lotion SMARTSIG:4 Drop(s) In Ear(s) Every Night PRN     phentermine 30 MG capsule Take 1 capsule (30 mg total) by mouth every morning. Start with 30 mg dose, then titrate up to 37.5 mg after 7 days. 7 capsule 0   phentermine 37.5 MG capsule Take 1 capsule (37.5 mg total) by mouth every morning. 30 capsule 0   propranolol ER (INDERAL LA) 60 MG 24 hr capsule Take 1 capsule (60 mg total) by mouth daily. 30 capsule 5   scopolamine (TRANSDERM-SCOP) 1 MG/3DAYS Place 1 patch (1.5 mg total) onto the skin every 3 (three) days. 10 patch 0   SUMAtriptan (IMITREX) 100 MG tablet Take 1 tablet earliest onset of migraine.  May repeat in 2 hours if headache persists or recurs.  Maximum 2 tablets in 24 hours. 10 tablet 5   topiramate (TOPAMAX) 25 MG tablet Take 1 tablet (25 mg total) by mouth 2 (two) times daily. Start with 1 tablet daily x 1 week, then increase to 1 tablet twice daily 60 tablet 5   No current facility-administered medications on file prior to visit.    ALLERGIES: Allergies  Allergen Reactions   Vyleesi [Bremelanotide] Itching, Nausea And Vomiting and Other (See Comments)    Flushing    FAMILY HISTORY: Family History  Problem Relation Age of Onset   Heart disease Mother    Diabetes Mother    Anxiety disorder Mother    Arthritis Mother    Skin cancer Mother    Diabetes Father    Skin cancer Father    Arthritis Father    Colon polyps Father    Peptic Ulcer Disease Father    Colon polyps Brother    Colon cancer Paternal Aunt    Heart disease Maternal Grandmother    Colon cancer Paternal Grandfather       Objective:  *** General: No acute distress.  Patient appears ***-groomed.   Head:  Normocephalic/atraumatic Eyes:  Fundi examined but not visualized Neck: supple, no paraspinal tenderness, full range of motion Heart:  Regular rate and rhythm Lungs:  Clear to auscultation bilaterally Back: No paraspinal  tenderness Neurological Exam: alert and oriented to person, place, and time.  Speech fluent and not dysarthric, language intact.  CN II-XII intact. Bulk and tone normal, muscle strength 5/5 throughout.  Sensation to light touch intact.  Deep tendon reflexes 2+ throughout, toes downgoing.  Finger to nose testing intact.  Gait normal, Romberg negative.   Shon Millet, DO  CC: ***

## 2021-05-05 ENCOUNTER — Ambulatory Visit: Payer: BC Managed Care – PPO | Admitting: Neurology

## 2021-05-12 ENCOUNTER — Ambulatory Visit: Payer: BC Managed Care – PPO | Admitting: Family

## 2021-05-12 ENCOUNTER — Encounter: Payer: Self-pay | Admitting: Family

## 2021-05-12 ENCOUNTER — Other Ambulatory Visit: Payer: Self-pay

## 2021-05-12 DIAGNOSIS — F419 Anxiety disorder, unspecified: Secondary | ICD-10-CM | POA: Diagnosis not present

## 2021-05-12 DIAGNOSIS — F32A Depression, unspecified: Secondary | ICD-10-CM | POA: Diagnosis not present

## 2021-05-12 DIAGNOSIS — E669 Obesity, unspecified: Secondary | ICD-10-CM

## 2021-05-12 NOTE — Assessment & Plan Note (Signed)
Excellent control.  Patient prefers to follow with me at this time.  I am agreeable to this and I advised her to let Dr. Elna Breslow know.  Continue prozac 40mg , bupsar 30mg 

## 2021-05-12 NOTE — Assessment & Plan Note (Signed)
In agreement for her to continue phentermine, Topamax as prescribed by Dr. Valentino Saxon.  We did discuss Ozempic if she would like to try this in the near future . will follow

## 2021-05-12 NOTE — Progress Notes (Signed)
Subjective:    Patient ID: Andrea Roberson, female    DOB: 05/20/1986, 35 y.o.   MRN: [id number]  CC: Andrea Roberson is a 35 y.o. female who presents today for follow up.   HPI: Feels well today No new complaints  Started phentermine and topamax for weight loss by Dr Andrea Roberson 3 weeks ago.  She has been on phentermine in the past with fairly good success.  She also discussed Ozempic with Dr. Valentino Roberson.  She opted for phentermine due to success in the past.  She denies increased palpitations, anxiety or trouble sleeping.  No family or personal thyroid cancer.    GAD- she is taking prozac 40mg , bupsar 30mg  as prescribed by Dr. , psychiatry.  She feels really well on this regimen and feels like it is working well for her.  She would like to know  if if she could resume care here. No si/hi     HISTORY:  Past Medical History:  Diagnosis Date   Anxiety    Asthma    Bipolar disorder (HCC)    Depression    Frequent headaches    GERD (gastroesophageal reflux disease)    Gestational diabetes    History of gestational diabetes 2016   Hyperlipidemia    Migraines    Obesity (BMI 35.0-39.9 without comorbidity) 12/24/2015   Tarsal coalition of left foot    TMJ (dislocation of temporomandibular joint)    UTI (urinary tract infection)    Wears contact lenses    Past Surgical History:  Procedure Laterality Date   CESAREAN SECTION  2015   CESAREAN SECTION WITH BILATERAL TUBAL LIGATION N/A 11/19/2018   Procedure: CESAREAN SECTION WITH BILATERAL TUBAL LIGATION;  Surgeon: 02/23/2016, MD;  Location: [facility];  Service: Obstetrics;  Laterality: N/A;  TOB 0822 APGAR 8/8     ESOPHAGOGASTRODUODENOSCOPY (EGD) WITH PROPOFOL N/A 08/30/2017   Procedure: ESOPHAGOGASTRODUODENOSCOPY (EGD) WITH PROPOFOL;  Surgeon: Andrea Laser, MD;  Location: [facility];  Service: Endoscopy;  Laterality: N/A;   WISDOM TOOTH EXTRACTION     Family History  Problem Relation Age of Onset   Heart  disease Mother    Diabetes Mother    Anxiety disorder Mother    Arthritis Mother    Skin cancer Mother    Diabetes Father    Skin cancer Father    Arthritis Father    Colon polyps Father    Peptic Ulcer Disease Father    Colon polyps Brother    Colon cancer Paternal Aunt    Heart disease Maternal Grandmother    Colon cancer Paternal Grandfather     Allergies: Vyleesi [bremelanotide] Current Outpatient Medications on File Prior to Visit  Medication Sig Dispense Refill   busPIRone (BUSPAR) 30 MG tablet Take 1 tablet (30 mg total) by mouth daily. 90 tablet 0   cetirizine (ZYRTEC) 10 MG tablet Take 10 mg by mouth daily as needed for allergies.      FLUoxetine (PROZAC) 40 MG capsule Take 1 capsule (40 mg total) by mouth daily. 90 capsule 1   fluticasone (FLONASE) 50 MCG/ACT nasal spray Place 2 sprays into both nostrils daily as needed for allergies.      hydrOXYzine (ATARAX/VISTARIL) 25 MG tablet Take 0.5-1 tablets (12.5-25 mg total) by mouth 2 (two) times daily as needed. For anxiety, sleep 60 tablet 1   meloxicam (MOBIC) 15 MG tablet Take 1 tablet (15 mg total) by mouth daily. 30 tablet 3   metFORMIN (GLUCOPHAGE) 500 MG tablet  TAKE 1 TABLET BY MOUTH TWICE DAILY 60 tablet 6   minoxidil (ROGAINE WOMENS) 2 % external solution Apply topically 2 (two) times daily.     mometasone (ELOCON) 0.1 % lotion SMARTSIG:4 Drop(s) In Ear(s) Every Night PRN     phentermine 37.5 MG capsule Take 1 capsule (37.5 mg total) by mouth every morning. 30 capsule 0   propranolol ER (INDERAL LA) 60 MG 24 hr capsule Take 1 capsule (60 mg total) by mouth daily. 30 capsule 5   scopolamine (TRANSDERM-SCOP) 1 MG/3DAYS Place 1 patch (1.5 mg total) onto the skin every 3 (three) days. 10 patch 0   SUMAtriptan (IMITREX) 100 MG tablet Take 1 tablet earliest onset of migraine.  May repeat in 2 hours if headache persists or recurs.  Maximum 2 tablets in 24 hours. 10 tablet 5   topiramate (TOPAMAX) 25 MG tablet Take 1 tablet (25  mg total) by mouth 2 (two) times daily. Start with 1 tablet daily x 1 week, then increase to 1 tablet twice daily (Patient taking differently: Take 25 mg by mouth 2 (two) times daily. 1 tablet twice daily) 60 tablet 5   UNABLE TO FIND Viviscal Pro for hair loss.     No current facility-administered medications on file prior to visit.    Social History   Tobacco Use   Smoking status: Former    Packs/day: 1.50    Years: 8.00    Pack years: 12.00    Types: Cigarettes    Quit date: 09/16/2012    Years since quitting: 8.6   Smokeless tobacco: Never  Vaping Use   Vaping Use: Former  Substance Use Topics   Alcohol use: Not Currently    Comment: 2 glasswine/month   Drug use: No    Review of Systems  Constitutional:  Negative for chills and fever.  Respiratory:  Negative for cough.   Cardiovascular:  Negative for chest pain and palpitations.  Gastrointestinal:  Negative for nausea and vomiting.     Objective:    Ht 5\' 5"  (1.651 m)   BMI 42.45 kg/m  BP Readings from Last 3 Encounters:  04/08/21 113/77  12/04/20 115/72  08/24/20 (!) 133/91   Wt Readings from Last 3 Encounters:  04/08/21 255 lb 1.6 oz (115.7 kg)  12/04/20 256 lb (116.1 kg)  08/24/20 244 lb (110.7 kg)    Physical Exam Vitals reviewed.  Constitutional:      Appearance: She is well-developed.  Eyes:     Conjunctiva/sclera: Conjunctivae normal.  Neck:     Thyroid: No thyroid mass or thyromegaly.  Cardiovascular:     Rate and Rhythm: Normal rate and regular rhythm.     Pulses: Normal pulses.     Heart sounds: Normal heart sounds.  Pulmonary:     Effort: Pulmonary effort is normal.     Breath sounds: Normal breath sounds. No wheezing, rhonchi or rales.  Lymphadenopathy:     Head:     Right side of head: No submental, submandibular, tonsillar, preauricular, posterior auricular or occipital adenopathy.     Left side of head: No submental, submandibular, tonsillar, preauricular, posterior auricular or  occipital adenopathy.     Cervical: No cervical adenopathy.  Skin:    General: Skin is warm and dry.  Neurological:     Mental Status: She is alert.  Psychiatric:        Speech: Speech normal.        Behavior: Behavior normal.        Thought  Content: Thought content normal.       Assessment & Plan:   Problem List Items Addressed This Visit       Other   Anxiety and depression    Excellent control.  Patient prefers to follow with me at this time.  I am agreeable to this and I advised her to let Dr. Elna Breslow know.  Continue prozac 40mg , bupsar 30mg        Obesity (BMI 35.0-39.9 without comorbidity)    In agreement for her to continue phentermine, Topamax as prescribed by Dr. .  We did discuss Ozempic if she would like to try this in the near future . will follow         I am having Amritha 09-16-1990 maintain her fluticasone, cetirizine, hydrOXYzine, mometasone, meloxicam, scopolamine, propranolol ER, SUMAtriptan, metFORMIN, busPIRone, FLUoxetine, phentermine, topiramate, UNABLE TO FIND, and Rogaine Womens.   No orders of the defined types were placed in this encounter.   Return precautions given.   Risks, benefits, and alternatives of the medications and treatment plan prescribed today were discussed, and patient expressed understanding.   Education regarding symptom management and diagnosis given to patient on AVS.  Continue to follow with Andrea Saxon, FNP for routine health maintenance.   Kimbella Georga Bora and I agreed with plan.   Allegra Grana, FNP

## 2021-05-18 ENCOUNTER — Other Ambulatory Visit: Payer: Self-pay

## 2021-05-18 ENCOUNTER — Encounter: Payer: Self-pay | Admitting: Psychiatry

## 2021-05-18 ENCOUNTER — Telehealth (INDEPENDENT_AMBULATORY_CARE_PROVIDER_SITE_OTHER): Payer: BC Managed Care – PPO | Admitting: Psychiatry

## 2021-05-18 DIAGNOSIS — G4701 Insomnia due to medical condition: Secondary | ICD-10-CM | POA: Diagnosis not present

## 2021-05-18 DIAGNOSIS — F411 Generalized anxiety disorder: Secondary | ICD-10-CM | POA: Diagnosis not present

## 2021-05-18 DIAGNOSIS — F3342 Major depressive disorder, recurrent, in full remission: Secondary | ICD-10-CM | POA: Diagnosis not present

## 2021-05-18 MED ORDER — FLUOXETINE HCL 40 MG PO CAPS: 40.0000 mg | ORAL_CAPSULE | Freq: Every day | ORAL | 1 refills | Status: DC

## 2021-05-18 MED ORDER — BUSPIRONE HCL 30 MG PO TABS: 30.0000 mg | ORAL_TABLET | Freq: Every day | ORAL | 1 refills | Status: DC

## 2021-05-18 NOTE — Progress Notes (Signed)
Virtual Visit via Video Note  I connected with Andrea Roberson on 05/18/21 at  4:00 PM EDT by a video enabled telemedicine application and verified that I am speaking with the correct person using two identifiers.  Location Provider Location : [facility] Patient Location : Home  Participants: Patient , Provider   I discussed the limitations of evaluation and management by telemedicine and the availability of in person appointments. The patient expressed understanding and agreed to proceed.    I discussed the assessment and treatment plan with the patient. The patient was provided an opportunity to ask questions and all were answered. The patient agreed with the plan and demonstrated an understanding of the instructions.   The patient was advised to call back or seek an in-person evaluation if the symptoms worsen or if the condition fails to improve as anticipated.                                                        BH MD OP Progress Note  05/18/2021 4:15 PM Andrea Roberson  MRN:  [id number]  Chief Complaint:  Chief Complaint   Follow-up; Anxiety    HPI: Andrea Roberson is a 35 year old female, married, lives in [place], has a history of GAD, MDD, hyperlipidemia, insomnia was evaluated by telemedicine today.  Patient today reports she has been feeling irritable since the past 2 to 3 weeks on and off.  She also reports sleep problems on and off.  She reports it started since she was started on phentermine for weight loss.  She has tried phentermine in the past and she had the same side effects.  She however reports she wants to give it more time.  Overall she is sleeping okay even though she is having trouble.  She is compliant on the Prozac and the BuSpar.  Denies side effects.  Denies any depression symptoms.  Reports appetite is fair.  Denies any suicidality, homicidality or perceptual disturbances.  Patient denies any other concerns today.  Visit Diagnosis:    ICD-10-CM    1. Generalized anxiety disorder  F41.1 busPIRone (BUSPAR) 30 MG tablet    FLUoxetine (PROZAC) 40 MG capsule    2. MDD (major depressive disorder), recurrent, in full remission (HCC)  F33.42     3. Insomnia due to medical condition  G47.01    likely due to phentermine      Past Psychiatric History: I have reviewed past psychiatric history from progress note on 05/15/2019.  Past trials of Prozac, trazodone, BuSpar, Zoloft  Past Medical History:  Past Medical History:  Diagnosis Date   Anxiety    Asthma    Bipolar disorder (HCC)    Depression    Frequent headaches    GERD (gastroesophageal reflux disease)    Gestational diabetes    History of gestational diabetes 2016   Hyperlipidemia    Migraines    Obesity (BMI 35.0-39.9 without comorbidity) 12/24/2015   Tarsal coalition of left foot    TMJ (dislocation of temporomandibular joint)    UTI (urinary tract infection)    Wears contact lenses     Past Surgical History:  Procedure Laterality Date   CESAREAN SECTION  2015   CESAREAN SECTION WITH BILATERAL TUBAL LIGATION N/A 11/19/2018   Procedure: CESAREAN SECTION WITH BILATERAL TUBAL LIGATION;  Surgeon:  Hildred Laser, MD;  Location: [facility];  Service: Obstetrics;  Laterality: N/A;  TOB 0822 APGAR 8/8     ESOPHAGOGASTRODUODENOSCOPY (EGD) WITH PROPOFOL N/A 08/30/2017   Procedure: ESOPHAGOGASTRODUODENOSCOPY (EGD) WITH PROPOFOL;  Surgeon: Toney Reil, MD;  Location: [facility];  Service: Endoscopy;  Laterality: N/A;   WISDOM TOOTH EXTRACTION      Family Psychiatric History: Reviewed family psychiatric history from progress note on 05/15/2019  Family History:  Family History  Problem Relation Age of Onset   Heart disease Mother    Diabetes Mother    Anxiety disorder Mother    Arthritis Mother    Skin cancer Mother    Diabetes Father    Skin cancer Father    Arthritis Father    Colon polyps Father    Peptic Ulcer Disease Father    Colon polyps Brother     Colon cancer Paternal Aunt    Heart disease Maternal Grandmother    Colon cancer Paternal Grandfather     Social History: Reviewed social history from progress note on 05/15/2019 Social History   Socioeconomic History   Marital status: Married    Spouse name: Not on file   Number of children: Not on file   Years of education: Not on file   Highest education level: Not on file  Occupational History   Not on file  Tobacco Use   Smoking status: Former    Packs/day: 1.50    Years: 8.00    Pack years: 12.00    Types: Cigarettes    Quit date: 09/16/2012    Years since quitting: 8.6   Smokeless tobacco: Never  Vaping Use   Vaping Use: Former  Substance and Sexual Activity   Alcohol use: Not Currently    Comment: 2 glasswine/month   Drug use: No   Sexual activity: Yes    Birth control/protection: None, Surgical    Comment: tubal  Other Topics Concern   Not on file  Social History Narrative   Married   5 yo son , 5 month daughter   [employer], broadcasting/film/video- pre K   Right handed      Social Determinants of Health   Financial Resource Strain: Not on file  Food Insecurity: Not on file  Transportation Needs: Not on file  Physical Activity: Not on file  Stress: Not on file  Social Connections: Not on file    Allergies:  Allergies  Allergen Reactions   Vyleesi [Bremelanotide] Itching, Nausea And Vomiting and Other (See Comments)    Flushing    Metabolic Disorder Labs: Lab Results  Component Value Date   HGBA1C 6.1 (H) 04/08/2021   No results found for: PROLACTIN Lab Results  Component Value Date   CHOL 222 (H) 04/08/2021   TRIG 517 (H) 04/08/2021   HDL 33 (L) 04/08/2021   CHOLHDL 6.7 (H) 04/08/2021   VLDL 34.8 02/19/2018   LDLCALC 101 (H) 04/08/2021   LDLCALC 163 (H) 04/08/2020   Lab Results  Component Value Date   TSH 1.670 04/08/2021   TSH 0.90 05/29/2019    Therapeutic Level Labs: No results found for: LITHIUM No results found for: VALPROATE No components found  for:  CBMZ  Current Medications: Current Outpatient Medications  Medication Sig Dispense Refill   busPIRone (BUSPAR) 30 MG tablet Take 1 tablet (30 mg total) by mouth daily. 90 tablet 1   cetirizine (ZYRTEC) 10 MG tablet Take 10 mg by mouth daily as needed for allergies.      FLUoxetine (  PROZAC) 40 MG capsule Take 1 capsule (40 mg total) by mouth daily. 90 capsule 1   fluticasone (FLONASE) 50 MCG/ACT nasal spray Place 2 sprays into both nostrils daily as needed for allergies.      hydrOXYzine (ATARAX/VISTARIL) 25 MG tablet Take 0.5-1 tablets (12.5-25 mg total) by mouth 2 (two) times daily as needed. For anxiety, sleep 60 tablet 1   meloxicam (MOBIC) 15 MG tablet Take 1 tablet (15 mg total) by mouth daily. 30 tablet 3   metFORMIN (GLUCOPHAGE) 500 MG tablet TAKE 1 TABLET BY MOUTH TWICE DAILY 60 tablet 6   minoxidil (ROGAINE WOMENS) 2 % external solution Apply topically 2 (two) times daily.     mometasone (ELOCON) 0.1 % lotion SMARTSIG:4 Drop(s) In Ear(s) Every Night PRN     phentermine 37.5 MG capsule Take 1 capsule (37.5 mg total) by mouth every morning. 30 capsule 0   propranolol ER (INDERAL LA) 60 MG 24 hr capsule Take 1 capsule (60 mg total) by mouth daily. 30 capsule 5   scopolamine (TRANSDERM-SCOP) 1 MG/3DAYS Place 1 patch (1.5 mg total) onto the skin every 3 (three) days. 10 patch 0   SUMAtriptan (IMITREX) 100 MG tablet Take 1 tablet earliest onset of migraine.  May repeat in 2 hours if headache persists or recurs.  Maximum 2 tablets in 24 hours. 10 tablet 5   topiramate (TOPAMAX) 25 MG tablet Take 1 tablet (25 mg total) by mouth 2 (two) times daily. Start with 1 tablet daily x 1 week, then increase to 1 tablet twice daily (Patient taking differently: Take 25 mg by mouth 2 (two) times daily. 1 tablet twice daily) 60 tablet 5   UNABLE TO FIND Viviscal Pro for hair loss.     No current facility-administered medications for this visit.     Musculoskeletal: Strength & Muscle Tone:   UTA Gait & Station: normal Patient leans: N/A  Psychiatric Specialty Exam: Review of Systems  Psychiatric/Behavioral:  Positive for sleep disturbance. The patient is nervous/anxious.   All other systems reviewed and are negative.  There were no vitals taken for this visit.There is no height or weight on file to calculate BMI.  General Appearance: Casual  Eye Contact:  Good  Speech:  Clear and Coherent  Volume:  Normal  Mood:  Irritable but managing well  Affect:  Appropriate  Thought Process:  Goal Directed and Descriptions of Associations: Intact  Orientation:  Full (Time, Place, and Person)  Thought Content: Logical   Suicidal Thoughts:  No  Homicidal Thoughts:  No  Memory:  Immediate;   Fair Recent;   Fair Remote;   Fair  Judgement:  Fair  Insight:  Good  Psychomotor Activity:  Normal  Concentration:  Concentration: Fair and Attention Span: Fair  Recall:  Good  Fund of Knowledge: Good  Language: Fair  Akathisia:  Negative  Handed:  Right  AIMS (if indicated): not done  Assets:  Communication Skills Desire for Improvement Housing Intimacy Leisure Time Physical Health Resilience Social Support Talents/Skills Transportation Vocational/Educational  ADL's:  Intact  Cognition: WNL  Sleep:   Restless   Screenings: GAD-7    Flowsheet Row Video Visit from 05/18/2021 in [facility] Video Visit from 02/17/2021 in [facility] Video Visit from 01[facility]19/2021 in [facility] Video Visit from 10/06/2020 in [facility] Office Visit [facility]/04[facility]2019 [facility]  Total GAD-7 Score 4 8 10 13 19       PHQ2-9  Flowsheet Row Video Visit from 05/18/2021 in [facility] Office Visit from 05/12/2021 in [facility] Video Visit from 01/19/2021 in [facility] Video Visit from 10/06/2020 in [facility] Office Visit from 07/24/2020 in [facility]  PHQ-2 Total Score 0 2 0 2 1  PHQ-9 Total Score 1 4 -- 9 5        Assessment and Plan: Andrea Roberson is a 35 year old female, has a history of GAD, insomnia, hyperlipidemia was evaluated by telemedicine today.  Patient is biologically predisposed given family history.  Patient with recent sleep problems, irritability likely due to phentermine.  Plan GAD-improving Prozac 40 mg p.o. daily BuSpar 30 mg p.o. daily Continue CBT with [facility] -she follows up with them every 2 weeks.  MDD in remission Prozac as prescribed BuSpar 30 mg p.o. daily  Insomnia-restless Likely due to phentermine.  She wants to give the medication more time. Continue sleep hygiene techniques  Follow-up in clinic in 3 months or sooner if needed.  This note was generated in part or whole with voice recognition software. Voice recognition is usually quite accurate but there are transcription errors that can and very often do occur. I apologize for any typographical errors that were not detected and corrected.       Jomarie Longs, MD 05/19/2021, 10:20 AM

## 2021-06-02 ENCOUNTER — Other Ambulatory Visit: Payer: Self-pay

## 2021-06-02 ENCOUNTER — Encounter: Payer: Self-pay | Admitting: Obstetrics and Gynecology

## 2021-06-02 ENCOUNTER — Ambulatory Visit: Payer: BC Managed Care – PPO | Admitting: Obstetrics and Gynecology

## 2021-06-02 VITALS — BP 123/84 | HR 78 | Ht 65.0 in | Wt 245.1 lb

## 2021-06-02 DIAGNOSIS — Z6841 Body Mass Index (BMI) 40.0 and over, adult: Secondary | ICD-10-CM

## 2021-06-02 DIAGNOSIS — R7303 Prediabetes: Secondary | ICD-10-CM

## 2021-06-02 DIAGNOSIS — E785 Hyperlipidemia, unspecified: Secondary | ICD-10-CM | POA: Diagnosis not present

## 2021-06-02 DIAGNOSIS — Z7689 Persons encountering health services in other specified circumstances: Secondary | ICD-10-CM

## 2021-06-02 MED ORDER — METFORMIN HCL 500 MG PO TABS: ORAL_TABLET | ORAL | 3 refills | Status: DC

## 2021-06-02 MED ORDER — PHENTERMINE HCL 37.5 MG PO CAPS: 37.5000 mg | ORAL_CAPSULE | ORAL | 1 refills | Status: DC

## 2021-06-02 NOTE — Progress Notes (Signed)
    GYNECOLOGY PROGRESS NOTE  Subjective:    Patient ID: Andrea Roberson, female    DOB: [birth year], 35 y.o.   MRN: [id number]  HPI  Patient is a 35 y.o. female who presents for 6 week weight management follow up. She has a past history of obesity. She initiated use of Phentermine with Topamax 5-6 weeks ago.  Reports being more moody but otherwise denies any undesirable side effects and reports compliance with medications.    Current interventions:  1. Diet - cut back on portions.  Does more grazing during the day (more healthy snacks, no full meals), then eats dinner (usually using Hello Fresh meals). Has a protein shake approximately once daily. 2. Activity - exercising 3-4 days per week.  Doing more cardio now, but will incorporate some weights soon.  3. Reports bowel movements are normal.    The following portions of the patient's history were reviewed and updated as appropriate: allergies, current medications, past family history, past medical history, past social history, past surgical history, and problem list.  Review of Systems Pertinent items noted in HPI and remainder of comprehensive ROS otherwise negative.   Objective:    Vitals with BMI 06/02/2021 05/12/2021 04/08/2021  Height 5\' 5"  5\' 5"  5\' 5"   Weight 245 lbs 2 oz 248 lbs 3 oz 255 lbs 2 oz  BMI 40.79 41.3 42.45  Systolic 123 110  Diastolic 84 70 77  Pulse 78 88 72  Some encounter information is confidential and restricted. Go to Review Flowsheets activity to see all data.    General appearance: alert and no distress Abdomen: soft, non-tender.  Waist circumference 45 1/2 in.    Labs:  Lab Results  Component Value Date   HGBA1C 6.1 (H) 04/08/2021    Lab Results  Component Value Date   CHOL 222 (H) 04/08/2021   HDL 33 (L) 04/08/2021   LDLCALC 101 (H) 04/08/2021   TRIG 517 (H) 04/08/2021   CHOLHDL 6.7 (H) 04/08/2021    Assessment:   Weight management Obesity, Body mass index is 40.79  kg/m. Dyslipidemia Prediabetes  Plan:   Weight management  - continue to encourage weight loss regimen, can continue current management with Phentermine and Topamax.  Patient also has further questions about Ozempic. Discussed use of medication, can be an option later if Phentermine no longer working, or for continued progress with weight loss journey, depending on cost.  Refill given on Phentermine.  Dyslipidemia - currently trying to manage with diet and exercise. Will recheck labs at next visit.  Prediabetes - currently trying to manage with diet and exercise. Also on Metformin, needs refill.  Will recheck labs at next visit.   RTC in 2 months for follow up.   A total of 15 minutes were spent face-to-face with the patient during this encounter and over half of that time dealt with counseling and coordination of care.   04/10/2021, MD [facility]

## 2021-06-02 NOTE — Progress Notes (Deleted)
Andrea Roberson is a 35 y.o. female here for management regarding weight loss.  Silvano Bilis, LPN [facility]

## 2021-06-02 NOTE — Patient Instructions (Signed)
Preventing Unhealthy Weight Gain, Adult Staying at a healthy weight is important to your overall health. When fat builds up in your body, you may become overweight or obese. Being overweight or obese increases your risk of developing certain health problems, such as heartdisease, diabetes, sleeping problems, joint problems, and some types of cancer. Unhealthy weight gain is often the result of making unhealthy food choices or not getting enough exercise. You can make changes to your lifestyle to preventobesity and stay as healthy as possible. What nutrition changes can be made?  Eat only as much as your body needs. To do this: Pay attention to signs that you are hungry or full. Stop eating as soon as you feel full. If you feel hungry, try drinking water first before eating. Drink enough water so your urine is clear or pale yellow. Eat smaller portions. Pay attention to portion sizes when eating out. Look at serving sizes on food labels. Most foods contain more than one serving per container. Eat the recommended number of calories for your gender and activity level. For most active people, a daily total of 2,000 calories is appropriate. If you are trying to lose weight or are not very active, you may need to eat fewer calories. Talk with your health care provider or a diet and nutrition specialist (dietitian) about how many calories you need each day. Choose healthy foods, such as: Fruits and vegetables. At each meal, try to fill at least half of your plate with fruits and vegetables. Whole grains, such as whole-wheat bread, brown rice, and quinoa. Lean meats, such as chicken or fish. Other healthy proteins, such as beans, eggs, or tofu. Healthy fats, such as nuts, seeds, fatty fish, and olive oil. Low-fat or fat-free dairy products. Check food labels, and avoid food and drinks that: Are high in calories. Have added sugar. Are high in sodium. Have saturated fats or trans fats. Cook foods in  healthier ways, such as by baking, broiling, or grilling. Make a meal plan for the week, and shop with a grocery list to help you stay on track with your purchases. Try to avoid going to the grocery store when you are hungry. When grocery shopping, try to shop around the outside of the store first, where the fresh foods are. Doing this helps you to avoid prepackaged foods, which can be high in sugar, salt (sodium), and fat. What lifestyle changes can be made?  Exercise for 30 or more minutes on 5 or more days each week. Exercising may include brisk walking, yard work, biking, running, swimming, and team sports like basketball and soccer. Ask your health care provider which exercises are safe for you. Do muscle-strengthening activities, such as lifting weights or using resistance bands, on 2 or more days a week. Do not use any products that contain nicotine or tobacco, such as cigarettes and e-cigarettes. If you need help quitting, ask your health care provider. Limit alcohol intake to no more than 1 drink a day for nonpregnant women and 2 drinks a day for men. One drink equals 12 oz of beer, 5 oz of wine, or 1 oz of hard liquor. Try to get 7-9 hours of sleep each night. What other changes can be made? Keep a food and activity journal to keep track of: What you ate and how many calories you had. Remember to count the calories in sauces, dressings, and side dishes. Whether you were active, and what exercises you did. Your calorie, weight, and activity goals. Check your   weight regularly. Track any changes. If you notice you have gained weight, make changes to your diet or activity routine. Avoid taking weight-loss medicines or supplements. Talk to your health care provider before starting any new medicine or supplement. Talk to your health care provider before trying any new diet or exercise plan. Why are these changes important? Eating healthy, staying active, and having healthy habits can help you  to prevent obesity. Those changes also: Help you manage stress and emotions. Help you connect with friends and family. Improve your self-esteem. Improve your sleep. Prevent long-term health problems. What can happen if changes are not made? Being obese or overweight can cause you to develop joint or bone problems, which can make it hard for you to stay active or do activities you enjoy. Being obese or overweight also puts stress on your heart and lungs and can lead tohealth problems like diabetes, heart disease, and some cancers. Where to find more information Talk with your health care provider or a dietitian about healthy eating and healthy lifestyle choices. You may also find information from: U.S. Department of Agriculture, MyPlate: www.choosemyplate.gov American Heart Association: www.heart.org Centers for Disease Control and Prevention: www.cdc.gov Summary Staying at a healthy weight is important to your overall health. It helps you to prevent certain diseases and health problems, such as heart disease, diabetes, joint problems, sleep disorders, and some types of cancer. Being obese or overweight can cause you to develop joint or bone problems, which can make it hard for you to stay active or do activities you enjoy. You can prevent unhealthy weight gain by eating a healthy diet, exercising regularly, not smoking, limiting alcohol, and getting enough sleep. Talk with your health care provider or a dietitian for guidance about healthy eating and healthy lifestyle choices. This information is not intended to replace advice given to you by your health care provider. Make sure you discuss any questions you have with your healthcare provider. Document Revised: 01/30/2020 Document Reviewed: 01/30/2020 Elsevier Patient Education  2022 Elsevier Inc.  

## 2021-07-02 ENCOUNTER — Encounter: Payer: Self-pay | Admitting: Family

## 2021-07-26 ENCOUNTER — Other Ambulatory Visit: Payer: Self-pay | Admitting: Neurology

## 2021-07-30 ENCOUNTER — Telehealth: Payer: Self-pay | Admitting: Podiatry

## 2021-07-30 NOTE — Telephone Encounter (Signed)
Pt call and ask if she can have a refill on meloxicam (MOBIC) 15 MG tablet

## 2021-08-02 ENCOUNTER — Other Ambulatory Visit: Payer: Self-pay

## 2021-08-02 MED ORDER — METFORMIN HCL 500 MG PO TABS: ORAL_TABLET | ORAL | 3 refills | Status: DC

## 2021-08-02 NOTE — Progress Notes (Signed)
    GYNECOLOGY PROGRESS NOTE  Subjective:    Patient ID: Andrea Roberson, female    DOB: [birth year], 35 y.o.   MRN: [id number]  HPI  Patient is a 35 y.o. G61P2002 female who presents for 3 month weight management follow up. She has a past history of obesity. She initiated use of Phentermine with Topamax 3 months ago.  Reports being less moody, than previous visit but otherwise denies any undesirable side effects and reports compliance with medications. Starting weight was 255 lbs in June. Short term goal weight is 200 lbs. Long-term weight goal loss is 170 lbs.    Current interventions:  1. Diet - cut back on portions.  Does more grazing during the day (more healthy snacks, no full meals), then eats dinner (usually using Hello Fresh meals). Has a protein shake approximately once daily. 2. Activity - She has not been able to work out as much due to school and kids being actively involved in extracurricular activities.  3. Reports bowel movements are abnormal, she has been having some constipation. Is drinking large amounts of water.    The following portions of the patient's history were reviewed and updated as appropriate: allergies, current medications, past family history, past medical history, past social history, past surgical history, and problem list.  Review of Systems Pertinent items noted in HPI and remainder of comprehensive ROS otherwise negative.   Objective:    Vitals with BMI 08/03/2021 06/02/2021 05/12/2021  Height 5\' 5"  5\' 5"  5\' 5"   Weight 231 lbs 14 oz 245 lbs 2 oz 248 lbs 3 oz  BMI 38.59 40.79 41.3  Systolic 121 123  Diastolic 79 84 70  Pulse 89 78 88  Some encounter information is confidential and restricted. Go to Review Flowsheets activity to see all data.    General appearance: alert, cooperative, and no distress Abdomen: soft, non-tender.  Waist circumference 41 in.    Labs:  Lab Results  Component Value Date   CHOL 222 (H) 04/08/2021   HDL 33 (L)  04/08/2021   LDLCALC 101 (H) 04/08/2021   TRIG 517 (H) 04/08/2021   CHOLHDL 6.7 (H) 04/08/2021    Assessment:   Weight management Obesity,Class II. Body mass index is 38.59 kg/m. Dyslipidemia  History of GDM Constipation  Plan:   Weight management  - doing well with weight loss, can continue current management. Will continue weight loss management for an additional 3 months. Strongly encouraged resuming physical activity to maximize weight loss efforts. Total weight loss 24 lbs thus far.  Advised on ways to maintain her overall health regimen as she notes she is going on vacation next week to [place], and with the upcoming holidays nearing.  Dyslipidemia - will f/u with labs at conclusion of weight loss journey History of GDM, last A1c normal.  Constipation - discussed increasing fiber intake, already increasing water intake. Can also utilize prunes/dates.  Lastly, consider stool softeners as needed.   A total of 20 minutes were spent face-to-face with the patient during this encounter and over half of that time dealt with counseling and coordination of care.  04/10/2021, MD [facility]

## 2021-08-03 ENCOUNTER — Encounter: Payer: Self-pay | Admitting: Obstetrics and Gynecology

## 2021-08-03 ENCOUNTER — Ambulatory Visit: Payer: BC Managed Care – PPO | Admitting: Obstetrics and Gynecology

## 2021-08-03 ENCOUNTER — Other Ambulatory Visit: Payer: Self-pay

## 2021-08-03 VITALS — BP 121/79 | HR 89 | Resp 16 | Ht 65.0 in | Wt 231.9 lb

## 2021-08-03 DIAGNOSIS — Z7689 Persons encountering health services in other specified circumstances: Secondary | ICD-10-CM

## 2021-08-03 DIAGNOSIS — E669 Obesity, unspecified: Secondary | ICD-10-CM | POA: Diagnosis not present

## 2021-08-03 DIAGNOSIS — Z8632 Personal history of gestational diabetes: Secondary | ICD-10-CM

## 2021-08-03 DIAGNOSIS — K5900 Constipation, unspecified: Secondary | ICD-10-CM

## 2021-08-03 DIAGNOSIS — R7303 Prediabetes: Secondary | ICD-10-CM

## 2021-08-03 DIAGNOSIS — E785 Hyperlipidemia, unspecified: Secondary | ICD-10-CM | POA: Diagnosis not present

## 2021-08-03 MED ORDER — PHENTERMINE HCL 37.5 MG PO CAPS: 37.5000 mg | ORAL_CAPSULE | ORAL | 1 refills | Status: DC

## 2021-08-04 ENCOUNTER — Telehealth: Payer: Self-pay | Admitting: Obstetrics and Gynecology

## 2021-08-04 ENCOUNTER — Other Ambulatory Visit: Payer: Self-pay | Admitting: Neurology

## 2021-08-04 NOTE — Telephone Encounter (Signed)
Received Fax 10-19 9:00am   Cover my meds   [facility]  Rx: Phentermine HCL 37.5MG   Key: [id number]  Placed in Dr.Cherry Box

## 2021-08-18 ENCOUNTER — Other Ambulatory Visit: Payer: Self-pay

## 2021-08-18 ENCOUNTER — Telehealth (INDEPENDENT_AMBULATORY_CARE_PROVIDER_SITE_OTHER): Payer: Self-pay | Admitting: Psychiatry

## 2021-08-18 DIAGNOSIS — F411 Generalized anxiety disorder: Secondary | ICD-10-CM

## 2021-08-18 NOTE — Progress Notes (Signed)
Contacted patient when she did not connect by video.  Patient reports she forgot all about the appointment, she will call back to reschedule.

## 2021-08-24 ENCOUNTER — Ambulatory Visit: Payer: BC Managed Care – PPO | Admitting: Podiatry

## 2021-08-24 ENCOUNTER — Other Ambulatory Visit: Payer: Self-pay

## 2021-08-24 DIAGNOSIS — M25572 Pain in left ankle and joints of left foot: Secondary | ICD-10-CM

## 2021-08-24 DIAGNOSIS — Q6689 Other  specified congenital deformities of feet: Secondary | ICD-10-CM

## 2021-08-24 MED ORDER — MELOXICAM 15 MG PO TABS: 15.0000 mg | ORAL_TABLET | Freq: Every day | ORAL | 3 refills | Status: DC

## 2021-08-24 MED ORDER — BETAMETHASONE SOD PHOS & ACET 6 (3-3) MG/ML IJ SUSP: 3.0000 mg | Freq: Once | INTRAMUSCULAR | Status: DC

## 2021-08-24 NOTE — Progress Notes (Signed)
   HPI: 35 year old female presenting today for follow up evaluation of left foot and ankle pain.  Patient states that she got significant amount of relief from the injections.  She recently ran out of her meloxicam and noticed that the pain slowly began to increase.  Patient recently [place] from [place].  She has had an increase amount of pain with walking.  She did wear the cam boot intermittently   no new complaints at this time  Past Medical History:  Diagnosis Date   Anxiety    Asthma    Bipolar disorder (HCC)    Depression    Frequent headaches    GERD (gastroesophageal reflux disease)    Gestational diabetes    History of gestational diabetes 2016   Hyperlipidemia    Migraines    Obesity (BMI 35.0-39.9 without comorbidity) 12/24/2015   Tarsal coalition of left foot    TMJ (dislocation of temporomandibular joint)    UTI (urinary tract infection)    Wears contact lenses      Physical Exam: General: The patient is alert and oriented x3 in no acute distress.  Dermatology: Skin is warm, dry and supple bilateral lower extremities. Negative for open lesions or macerations.  Vascular: Palpable pedal pulses bilaterally. No edema or erythema noted. Capillary refill within normal limits.  Neurological: Epicritic and protective threshold grossly intact bilaterally.   Musculoskeletal Exam: Limited ROM with inversion and eversion of the subtalar joint. Muscle strength 5/5 in all groups bilateral.  There is some pain with inversion and eversion as well as pain on palpation of the sinus tarsi left.   Assessment: 1. Tarsal coalition left 2. Sinus tarsitis left 3. 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 Patient evaluated.  2. Injection of 0.5 mLs Celestone Soluspan injected into the left sinus tarsi.  3. Continue taking Meloxicam as needed.  Refill provided 4.  Recommend good supportive sneakers  5. Return to clinic as needed.   Preschool/kindergarten Engineer, site.  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 40yr old son.   Felecia Shelling, DPM [facility]  Dr. Felecia Shelling, DPM    [address] [place], [place] [address]                Office [phone]  Fax [phone]

## 2021-09-13 ENCOUNTER — Other Ambulatory Visit: Payer: Self-pay

## 2021-09-13 ENCOUNTER — Encounter: Payer: Self-pay | Admitting: Family

## 2021-09-13 ENCOUNTER — Ambulatory Visit: Payer: BC Managed Care – PPO | Admitting: Family

## 2021-09-13 VITALS — BP 110/76 | HR 74 | Temp 98.0°F | Ht 65.0 in | Wt 233.8 lb

## 2021-09-13 DIAGNOSIS — G43109 Migraine with aura, not intractable, without status migrainosus: Secondary | ICD-10-CM | POA: Diagnosis not present

## 2021-09-13 DIAGNOSIS — E669 Obesity, unspecified: Secondary | ICD-10-CM | POA: Diagnosis not present

## 2021-09-13 DIAGNOSIS — Z87898 Personal history of other specified conditions: Secondary | ICD-10-CM | POA: Diagnosis not present

## 2021-09-13 LAB — POCT GLYCOSYLATED HEMOGLOBIN (HGB A1C): Hemoglobin A1C: 5.6 % (ref 4.0–5.6)

## 2021-09-13 MED ORDER — TIRZEPATIDE 2.5 MG/0.5ML ~~LOC~~ SOAJ: 2.5000 mg | SUBCUTANEOUS | 1 refills | Status: DC

## 2021-09-13 MED ORDER — PROPRANOLOL HCL ER 60 MG PO CP24: 60.0000 mg | ORAL_CAPSULE | Freq: Every day | ORAL | 3 refills | Status: DC

## 2021-09-13 NOTE — Progress Notes (Signed)
Subjective:    Patient ID: Andrea Roberson, female    DOB: [birth year], 35 y.o.   MRN: [id number]  CC: Andrea Roberson is a 35 y.o. female who presents today for follow up.   HPI: Migraine- She has very few HA since increased propranolol. She has rare 'rings or spots in vision. No vision loss, numbness, confusion.   she is compliant with topamax 25mg  bid, propranolol 60 mg daily. Uses imitrex 100mg  prn for ha rescue.  To follow-up with me here for ongoing treatment of migraine. MRI brain 09/2020 without acute findings She is on birth control.   Anxiety depression-following with Dr.Eappen.  She is compliant with Prozac 40 mg, BuSpar 30 mg, Atarax as needed   history of prediabetes-compliant with metformin 500mg  BID. No diarrhea.   She is taking phentermine as prescribed by GYN however has not lost weight.She has stopped the phentermine.     TSH 1.6 five months ago  Seen by neurology, Dr. 12/04/2020 for migraine  No personal or family h/o of thyroid cancer or MEN.  HISTORY:  Past Medical History:  Diagnosis Date   Anxiety    Asthma    Bipolar disorder (HCC)    Depression    Frequent headaches    GERD (gastroesophageal reflux disease)    Gestational diabetes    History of gestational diabetes 2016   Hyperlipidemia    Migraines    Obesity (BMI 35.0-39.9 without comorbidity) 12/24/2015   Tarsal coalition of left foot    TMJ (dislocation of temporomandibular joint)    UTI (urinary tract infection)    Wears contact lenses    Past Surgical History:  Procedure Laterality Date   CESAREAN SECTION  2015   CESAREAN SECTION WITH BILATERAL TUBAL LIGATION N/A 11/19/2018   Procedure: CESAREAN SECTION WITH BILATERAL TUBAL LIGATION;  Surgeon: 09-16-1990, MD;  Location: [facility];  Service: Obstetrics;  Laterality: N/A;  TOB 0822 APGAR 8/8     ESOPHAGOGASTRODUODENOSCOPY (EGD) WITH PROPOFOL N/A 08/30/2017   Procedure: ESOPHAGOGASTRODUODENOSCOPY (EGD) WITH PROPOFOL;  Surgeon:  01/18/2019, MD;  Location: [facility];  Service: Endoscopy;  Laterality: N/A;   WISDOM TOOTH EXTRACTION     Family History  Problem Relation Age of Onset   Heart disease Mother    Diabetes Mother    Anxiety disorder Mother    Arthritis Mother    Skin cancer Mother    Diabetes Father    Skin cancer Father    Arthritis Father    Colon polyps Father    Peptic Ulcer Disease Father    Colon polyps Brother    Heart disease Maternal Grandmother    Colon cancer Paternal Grandfather    Colon cancer Paternal Aunt    Thyroid cancer Neg Hx     Allergies: Vyleesi [bremelanotide] Current Outpatient Medications on File Prior to Visit  Medication Sig Dispense Refill   busPIRone (BUSPAR) 30 MG tablet Take 1 tablet (30 mg total) by mouth daily. 90 tablet 1   cetirizine (ZYRTEC) 10 MG tablet Take 10 mg by mouth daily as needed for allergies.      FLUoxetine (PROZAC) 40 MG capsule Take 1 capsule (40 mg total) by mouth daily. 90 capsule 1   fluticasone (FLONASE) 50 MCG/ACT nasal spray Place 2 sprays into both nostrils daily as needed for allergies.      hydrOXYzine (ATARAX/VISTARIL) 25 MG tablet Take 0.5-1 tablets (12.5-25 mg total) by mouth 2 (two) times daily as needed. For anxiety, sleep  60 tablet 1   meloxicam (MOBIC) 15 MG tablet Take 1 tablet (15 mg total) by mouth daily. 30 tablet 3   metFORMIN (GLUCOPHAGE) 500 MG tablet TAKE 1 TABLET BY MOUTH TWICE DAILY 180 tablet 3   minoxidil (ROGAINE WOMENS) 2 % external solution Apply topically 2 (two) times daily.     mometasone (ELOCON) 0.1 % lotion SMARTSIG:4 Drop(s) In Ear(s) Every Night PRN     SUMAtriptan (IMITREX) 100 MG tablet Take 1 tablet earliest onset of migraine.  May repeat in 2 hours if headache persists or recurs.  Maximum 2 tablets in 24 hours. 10 tablet 5   topiramate (TOPAMAX) 25 MG tablet Take 1 tablet (25 mg total) by mouth 2 (two) times daily. Start with 1 tablet daily x 1 week, then increase to 1 tablet twice daily  (Patient taking differently: Take 25 mg by mouth 2 (two) times daily. 1 tablet twice daily) 60 tablet 5   UNABLE TO FIND Viviscal Pro for hair loss.     scopolamine (TRANSDERM-SCOP) 1 MG/3DAYS Place 1 patch (1.5 mg total) onto the skin every 3 (three) days. (Patient not taking: Reported on 09/13/2021) 10 patch 0   Current Facility-Administered Medications on File Prior to Visit  Medication Dose Route Frequency Provider Last Rate Last Admin   betamethasone acetate-betamethasone sodium phosphate (CELESTONE) injection 3 mg  3 mg Intra-articular Once Felecia Shelling, DPM        Social History   Tobacco Use   Smoking status: Former    Packs/day: 1.50    Years: 8.00    Pack years: 12.00    Types: Cigarettes    Quit date: 09/16/2012    Years since quitting: 9.0   Smokeless tobacco: Never  Vaping Use   Vaping Use: Former  Substance Use Topics   Alcohol use: Not Currently    Comment: 2 glasswine/month   Drug use: No    Review of Systems  Constitutional:  Negative for chills and fever.  HENT:  Negative for trouble swallowing.   Respiratory:  Negative for cough.   Cardiovascular:  Negative for chest pain and palpitations.  Gastrointestinal:  Negative for nausea and vomiting.  Neurological:  Positive for headaches (well controlled).     Objective:    BP 110/76 (BP Location: Left Arm, Patient Position: Sitting, Cuff Size: Large)   Pulse 74   Temp 98 F (36.7 C) (Oral)   Ht 5\' 5"  (1.651 m)   Wt 233 lb 12.8 oz (106.1 kg)   SpO2 98%   BMI 38.91 kg/m  BP Readings from Last 3 Encounters:  09/13/21 110/76  08/03/21 121/79  06/02/21 123/84   Wt Readings from Last 3 Encounters:  09/13/21 233 lb 12.8 oz (106.1 kg)  08/03/21 231 lb 14.4 oz (105.2 kg)  06/02/21 245 lb 1.6 oz (111.2 kg)    Physical Exam Vitals reviewed.  Constitutional:      Appearance: She is well-developed.  Eyes:     Conjunctiva/sclera: Conjunctivae normal.  Neck:     Thyroid: No thyroid mass or thyromegaly.   Cardiovascular:     Rate and Rhythm: Normal rate and regular rhythm.     Pulses: Normal pulses.     Heart sounds: Normal heart sounds.  Pulmonary:     Effort: Pulmonary effort is normal.     Breath sounds: Normal breath sounds. No wheezing, rhonchi or rales.  Skin:    General: Skin is warm and dry.  Neurological:     Mental Status: She  is alert.  Psychiatric:        Speech: Speech normal.        Behavior: Behavior normal.        Thought Content: Thought content normal.       Assessment & Plan:   Problem List Items Addressed This Visit       Cardiovascular and Mediastinum   Migraines - Primary    Chronic, stable.  Improved overall.  Headaches are similar to headaches in the past .no vision loss. Continue propranolol 60 mg ER, Imitrex 100 mg as needed.  I will resume prescription for this and she will no longer follow with neurology, Dr. Everlena Cooper at this time. counseled on importance of close vigilance of headache whenever the more intense, more frequent, present with visual loss, or changes, she understands to go to ED.       Relevant Medications   propranolol ER (INDERAL LA) 60 MG 24 hr capsule     Other   Obesity (BMI 35.0-39.9 without comorbidity)    Topamax, metformin and phentermine have not been effective for weight loss.  Discussed mounjaro and blackbox warning as relates to medullary thyroid cancer, multiple endocrine neoplasia.  Patient is comfortable with starting this medication and we will stop phentermine as prescribed by GYN.  For now, we will continue Topamax 25 mg twice daily however likely will discontinue this medication and monitor for headache frequency.  Continue metformin 500 mg twice daily      Relevant Medications   tirzepatide (MOUNJARO) 2.5 MG/0.5ML Pen   Other Visit Diagnoses     History of prediabetes       Relevant Orders   POCT HgB A1C (Completed)        I have discontinued Betha M. Tool's phentermine. I am also having her start on  tirzepatide. Additionally, I am having her maintain her fluticasone, cetirizine, hydrOXYzine, mometasone, scopolamine, SUMAtriptan, topiramate, UNABLE TO FIND, Rogaine Womens, busPIRone, FLUoxetine, metFORMIN, meloxicam, and propranolol ER. We will continue to administer betamethasone acetate-betamethasone sodium phosphate.   Meds ordered this encounter  Medications   propranolol ER (INDERAL LA) 60 MG 24 hr capsule    Sig: Take 1 capsule (60 mg total) by mouth daily.    Dispense:  90 capsule    Refill:  3    Order Specific Question:   Supervising Provider    Answer:   Duncan Dull L [[id number]]   tirzepatide (MOUNJARO) 2.5 MG/0.5ML Pen    Sig: Inject 2.5 mg into the skin once a week.    Dispense:  2 mL    Refill:  1    Order Specific Question:   Supervising Provider    Answer:   Sherlene Shams [[id number]]    Return precautions given.   Risks, benefits, and alternatives of the medications and treatment plan prescribed today were discussed, and patient expressed understanding.   Education regarding symptom management and diagnosis given to patient on AVS.  Continue to follow with Allegra Grana, FNP for routine health maintenance.   Tymika Georga Bora and I agreed with plan.   Rennie Plowman, FNP

## 2021-09-13 NOTE — Patient Instructions (Addendum)
Continue metformin 500mg  twice per day   Certainly if your headaches were to worsen in any way , such as become more frequent,  more intense or present in an unusual fashion for you, please let me know.  We will wean off topamax at follow up.   If is not covered through your insurance, you may go to the Greggory Keen at [email or web address] to complete information for savings card.   You may also use the link below.   https://[email or web address]  You may take this savings to [facility], [facility] or [facility]. If you are unable to get mounjaro, please call to schedule an appointment with me so we can discuss alternative weight loss medications.   start Mounjaro 2.5mg  once per week injected subcutaneously ( [place])  in stomach. Please clean with alcohol swab prior to injection and be sure to rotate site. You may schedule a nurse visit if you would like to first injection.   After 4 weeks, and if tolerated and weight loss has not reached 1-2 lbs per week, please increase to 5mg  once per week [place].    Please read information on medication below and remember black box warning that you may not take if you or a family member is diagnosed with thyroid cancer (medullary thyroid cancer), or multiple endocrine neoplasia.       Tirzepatide Injection [employer]) What is this medication? TIRZEPATIDE (tir ZEP a tide) treats type 2 diabetes. It works by increasing insulin levels in your body, which decreases your blood sugar (glucose). Changes to diet and exercise are often combined with this medication. This medicine may be used for other purposes; ask your health care provider or pharmacist if you have questions. COMMON BRAND NAME(S): MOUNJARO What should I tell my care team before I take this medication? They need to know if you have any of these conditions: Endocrine tumors (MEN 2) or if someone in your family had these tumors Eye disease, vision problems Gallbladder  disease History of pancreatitis Kidney disease Stomach or intestine problems Thyroid cancer or if someone in your family had thyroid cancer An unusual or allergic reaction to tirzepatide, other medications, foods, dyes, or preservatives Pregnant or trying to get pregnant Breast-feeding How should I use this medication? This medication is injected under the skin. You will be taught how to prepare and give it. It is given once every week (every 7 days). Keep taking it unless your health care provider tells you to stop. If you use this medication with insulin, you should inject this medication and the insulin separately. Do not mix them together. Do not give the injections right next to each other. Change (rotate) injection sites with each injection. This medication comes with INSTRUCTIONS FOR USE. Ask your pharmacist for directions on how to use this medication. Read the information carefully. Talk to your pharmacist or care team if you have questions. It is important that you put your used needles and syringes in a special sharps container. Do not put them in a trash can. If you do not have a sharps container, call your pharmacist or care team to get one. A special MedGuide will be given to you by the pharmacist with each prescription and refill. Be sure to read this information carefully each time. Talk to your care team about the use of this medication in children. Special care may be needed. Overdosage: If you think you have taken too much of this medicine contact a poison control center or emergency room at once.  NOTE: This medicine is only for you. Do not share this medicine with others. What if I miss a dose? If you miss a dose, take it as soon as you can unless it is more than 4 days (96 hours) late. If it is more than 4 days late, skip the missed dose. Take the next dose at the normal time. Do not take 2 doses within 3 days of each other. What may interact with this medication? Alcohol  containing beverages Antiviral medications for HIV or AIDS Aspirin and aspirin-like medications Beta-blockers like atenolol, metoprolol, propranolol Certain medications for blood pressure, heart disease, irregular heart beat Chromium Clonidine Diuretics Female hormones, such as estrogens or progestins, birth control pills Fenofibrate Gemfibrozil Guanethidine Isoniazid Lanreotide Female hormones or anabolic steroids MAOIs like Carbex, Eldepryl, Marplan, Nardil, and Parnate Medications for weight loss Medications for allergies, asthma, cold, or cough Medications for depression, anxiety, or psychotic disturbances Niacin Nicotine NSAIDs, medications for pain and inflammation, like ibuprofen or naproxen Octreotide Other medications for diabetes, like glyburide, glipizide, or glimepiride Pasireotide Pentamidine Phenytoin Probenecid Quinolone antibiotics such as ciprofloxacin, levofloxacin, ofloxacin Reserpine Some herbal dietary supplements Steroid medications such as prednisone or cortisone Sulfamethoxazole; trimethoprim Thyroid hormones Warfarin This list may not describe all possible interactions. Give your health care provider a list of all the medicines, herbs, non-prescription drugs, or dietary supplements you use. Also tell them if you smoke, drink alcohol, or use illegal drugs. Some items may interact with your medicine. What should I watch for while using this medication? Visit your care team for regular checks on your progress. Drink plenty of fluids while taking this medication. Check with your care team if you get an attack of severe diarrhea, nausea, and vomiting. The loss of too much body fluid can make it dangerous for you to take this medication. A test called the HbA1C (A1C) will be monitored. This is a simple blood test. It measures your blood sugar control over the last 2 to 3 months. You will receive this test every 3 to 6 months. Learn how to check your blood  sugar. Learn the symptoms of low and high blood sugar and how to manage them. Always carry a quick-source of sugar with you in case you have symptoms of low blood sugar. Examples include hard sugar candy or glucose tablets. Make sure others know that you can choke if you eat or drink when you develop serious symptoms of low blood sugar, such as seizures or unconsciousness. They must get medical help at once. Tell your care team if you have high blood sugar. You might need to change the dose of your medication. If you are sick or exercising more than usual, you might need to change the dose of your medication. Do not skip meals. Ask your care team if you should avoid alcohol. Many nonprescription cough and cold products contain sugar or alcohol. These can affect blood sugar. Pens should never be shared. Even if the needle is changed, sharing may result in passing of viruses like hepatitis or HIV. Wear a medical ID bracelet or chain, and carry a card that describes your disease and details of your medication and dosage times. Birth control may not work properly while you are taking this medication. If you take birth control pills by mouth, your care team may recommend another type of birth control for 4 weeks after you start this medication and for 4 weeks after each increase in your dose of this medication. Ask your care team which  birth control methods you should use. What side effects may I notice from receiving this medication? Side effects that you should report to your care team as soon as possible: Allergic reactions-skin rash, itching, hives, swelling of the face, lips, tongue, or throat Change in vision Dehydration-increased thirst, dry mouth, feeling faint or lightheaded, headache, dark yellow or brown urine Gallbladder problems-severe stomach pain, nausea, vomiting, fever Kidney injury-decrease in the amount of urine, swelling of the ankles, hands, or feet Pancreatitis-severe stomach pain that  spreads to your back or gets worse after eating or when touched, fever, nausea, vomiting Thyroid cancer-new mass or lump in the neck, pain or trouble swallowing, trouble breathing, hoarseness Side effects that usually do not require medical attention (report these to your care team if they continue or are bothersome): Constipation Diarrhea Loss of Appetite Nausea Stomach pain Upset stomach Vomiting This list may not describe all possible side effects. Call your doctor for medical advice about side effects. You may report side effects to FDA at 1-800-FDA-1088. Where should I keep my medication? Keep out of the reach of children and pets. Refrigeration (preferred): Store unopened pens in a refrigerator between 2 and 8 degrees C (36 and 46 degrees F). Keep it in the original carton until you are ready to take it. Do not freeze or use if the medication has been frozen. Protect from light. Get rid of any unused medication after the expiration date on the label. Room Temperature: The pen may be stored at room temperature below 30 degrees C (86 degrees F) for up to a total of 21 days if needed. Protect from light. Avoid exposure to extreme heat. If it is stored at room temperature, throw away any unused medication after 21 days or after it expires, whichever is first. The pen has glass parts. Handle it carefully. If you drop the pen on a hard surface, do not use it. Use a new pen for your injection. To get rid of medications that are no longer needed or have expired: Take the medication to a medication take-back program. Check with your pharmacy or law enforcement to find a location. If you cannot return the medication, ask your pharmacist or care team how to get rid of this medication safely. NOTE: This sheet is a summary. It may not cover all possible information. If you have questions about this medicine, talk to your doctor, pharmacist, or health care provider.  2022 Elsevier/Gold Standard  (2021-03-02 13:57:48)

## 2021-09-13 NOTE — Assessment & Plan Note (Addendum)
Topamax, metformin and phentermine have not been effective for weight loss.  Discussed mounjaro and blackbox warning as relates to medullary thyroid cancer, multiple endocrine neoplasia.  Patient is comfortable with starting this medication and we will stop phentermine as prescribed by GYN.  For now, we will continue Topamax 25 mg twice daily however likely will discontinue this medication and monitor for headache frequency.  Continue metformin 500 mg twice daily

## 2021-09-13 NOTE — Assessment & Plan Note (Signed)
Chronic, stable.  Improved overall.  Headaches are similar to headaches in the past .no vision loss. Continue propranolol 60 mg ER, Imitrex 100 mg as needed.  I will resume prescription for this and she will no longer follow with neurology, Dr. Everlena Cooper at this time. counseled on importance of close vigilance of headache whenever the more intense, more frequent, present with visual loss, or changes, she understands to go to ED.

## 2021-09-26 ENCOUNTER — Ambulatory Visit
Admission: EM | Admit: 2021-09-26 | Discharge: 2021-09-26 | Disposition: A | Discharge status: Home / Self Care | Payer: BC Managed Care – PPO | Attending: Physician Assistant | Admitting: Physician Assistant

## 2021-09-26 ENCOUNTER — Encounter: Payer: Self-pay | Admitting: Emergency Medicine

## 2021-09-26 ENCOUNTER — Other Ambulatory Visit: Payer: Self-pay

## 2021-09-26 DIAGNOSIS — B3731 Acute candidiasis of vulva and vagina: Secondary | ICD-10-CM | POA: Insufficient documentation

## 2021-09-26 DIAGNOSIS — R300 Dysuria: Secondary | ICD-10-CM | POA: Diagnosis not present

## 2021-09-26 LAB — URINALYSIS, COMPLETE (UACMP) WITH MICROSCOPIC
Bilirubin Urine: NEGATIVE
Glucose, UA: NEGATIVE mg/dL
Hgb urine dipstick: NEGATIVE
Leukocytes,Ua: NEGATIVE
Nitrite: NEGATIVE
Protein, ur: NEGATIVE mg/dL
Specific Gravity, Urine: 1.02 (ref 1.005–1.030)
pH: 6 (ref 5.0–8.0)

## 2021-09-26 MED ORDER — NITROFURANTOIN MONOHYD MACRO 100 MG PO CAPS: 100.0000 mg | ORAL_CAPSULE | Freq: Two times a day (BID) | ORAL | 0 refills | Status: DC

## 2021-09-26 MED ORDER — FLUCONAZOLE 150 MG PO TABS: 150.0000 mg | ORAL_TABLET | Freq: Every day | ORAL | 0 refills | Status: DC

## 2021-09-26 NOTE — ED Provider Notes (Signed)
[facility]    CSN: [id number] Arrival date & time: 09/26/21  1005      History   Chief Complaint Chief Complaint  Patient presents with   Dysuria    HPI Andrea Roberson is a 35 y.o. female.   HPI  Dysuria: Pt reports that she has had dysuria which started on Wednesday. She also reports bladder pressure and mild low back pain. No fevers, vomiting, abdominal pain. She has tried hydration with water along with pre/pro biotics. LMP: 2 weeks ago.   Past Medical History:  Diagnosis Date   Anxiety    Asthma    Bipolar disorder (HCC)    Depression    Frequent headaches    GERD (gastroesophageal reflux disease)    Gestational diabetes    History of gestational diabetes 2016   Hyperlipidemia    Migraines    Obesity (BMI 35.0-39.9 without comorbidity) 12/24/2015   Tarsal coalition of left foot    TMJ (dislocation of temporomandibular joint)    UTI (urinary tract infection)    Wears contact lenses     Patient Active Problem List   Diagnosis Date Noted   MDD (major depressive disorder), recurrent, in full remission (HCC) 10/06/2020   TMJ (dislocation of temporomandibular joint)    Migraines    MDD (major depressive disorder), recurrent, in partial remission (HCC) 01/28/2020   MDD (major depressive disorder), recurrent episode, mild (HCC) 06/19/2019   Bereavement 05/15/2019   Insomnia due to medical condition 05/15/2019   Elevated cholesterol 04/24/2019   Dyspepsia    Constipation 05/12/2017   Generalized anxiety disorder 01/27/2016   Obesity (BMI 35.0-39.9 without comorbidity) 12/24/2015   Anxiety and depression 12/08/2015    Past Surgical History:  Procedure Laterality Date   CESAREAN SECTION  2015   CESAREAN SECTION WITH BILATERAL TUBAL LIGATION N/A 11/19/2018   Procedure: CESAREAN SECTION WITH BILATERAL TUBAL LIGATION;  Surgeon: Hildred Laser, MD;  Location: [facility];  Service: Obstetrics;  Laterality: N/A;  TOB 0822 APGAR 8/8      ESOPHAGOGASTRODUODENOSCOPY (EGD) WITH PROPOFOL N/A 08/30/2017   Procedure: ESOPHAGOGASTRODUODENOSCOPY (EGD) WITH PROPOFOL;  Surgeon: Toney Reil, MD;  Location: [facility];  Service: Endoscopy;  Laterality: N/A;   WISDOM TOOTH EXTRACTION      OB History     Gravida  2   Para  2   Term  2   Preterm      AB      Living  2      SAB      IAB      Ectopic      Multiple  0   Live Births  2            Home Medications    Prior to Admission medications   Medication Sig Start Date End Date Taking? Authorizing Provider  busPIRone (BUSPAR) 30 MG tablet Take 1 tablet (30 mg total) by mouth daily. 05/18/21  Yes Jomarie Longs, MD  FLUoxetine (PROZAC) 40 MG capsule Take 1 capsule (40 mg total) by mouth daily. 05/18/21  Yes Jomarie Longs, MD  fluticasone (FLONASE) 50 MCG/ACT nasal spray Place 2 sprays into both nostrils daily as needed for allergies.    Yes [provider]  hydrOXYzine (ATARAX/VISTARIL) 25 MG tablet Take 0.5-1 tablets (12.5-25 mg total) by mouth 2 (two) times daily as needed. For anxiety, sleep 09/05/19  Yes Eappen, Levin Bacon, MD  meloxicam (MOBIC) 15 MG tablet Take 1 tablet (15 mg total) by mouth daily.  08/24/21  Yes Felecia Shelling, DPM  metFORMIN (GLUCOPHAGE) 500 MG tablet TAKE 1 TABLET BY MOUTH TWICE DAILY 08/02/21  Yes Felecia Shelling, DPM  minoxidil (ROGAINE WOMENS) 2 % external solution Apply topically 2 (two) times daily.   Yes [provider]  propranolol ER (INDERAL LA) 60 MG 24 hr capsule Take 1 capsule (60 mg total) by mouth daily. 09/13/21  Yes Allegra Grana, FNP  SUMAtriptan (IMITREX) 100 MG tablet Take 1 tablet earliest onset of migraine.  May repeat in 2 hours if headache persists or recurs.  Maximum 2 tablets in 24 hours. 12/04/20  Yes Jaffe, Adam R, DO  tirzepatide [facility]) 2.5 MG/0.5ML Pen Inject 2.5 mg into the skin once a week. 09/13/21  Yes Allegra Grana, FNP  topiramate (TOPAMAX) 25 MG tablet Take 1  tablet (25 mg total) by mouth 2 (two) times daily. Start with 1 tablet daily x 1 week, then increase to 1 tablet twice daily Patient taking differently: Take 25 mg by mouth 2 (two) times daily. 1 tablet twice daily 04/20/21  Yes Hildred Laser, MD  cetirizine (ZYRTEC) 10 MG tablet Take 10 mg by mouth daily as needed for allergies.     [provider]  mometasone (ELOCON) 0.1 % lotion SMARTSIG:4 Drop(s) In Ear(s) Every Night PRN 03/06/20   [provider]  scopolamine (TRANSDERM-SCOP) 1 MG/3DAYS Place 1 patch (1.5 mg total) onto the skin every 3 (three) days. Patient not taking: Reported on 09/13/2021 10/22/20   Allegra Grana, FNP  UNABLE TO FIND Viviscal Pro for hair loss.    [provider]    Family History Family History  Problem Relation Age of Onset   Heart disease Mother    Diabetes Mother    Anxiety disorder Mother    Arthritis Mother    Skin cancer Mother    Diabetes Father    Skin cancer Father    Arthritis Father    Colon polyps Father    Peptic Ulcer Disease Father    Colon polyps Brother    Heart disease Maternal Grandmother    Colon cancer Paternal Grandfather    Colon cancer Paternal Aunt    Thyroid cancer Neg Hx     Social History Social History   Tobacco Use   Smoking status: Former    Packs/day: 1.50    Years: 8.00    Pack years: 12.00    Types: Cigarettes    Quit date: 09/16/2012    Years since quitting: 9.0   Smokeless tobacco: Never  Vaping Use   Vaping Use: Former  Substance Use Topics   Alcohol use: Not Currently    Comment: 2 glasswine/month   Drug use: No     Allergies   Vyleesi [bremelanotide]   Review of Systems Review of Systems  As stated above in HPI Physical Exam Triage Vital Signs ED Triage Vitals  Enc Vitals Group     BP 09/26/21 1035 114/78     Pulse Rate 09/26/21 1035 80     Resp 09/26/21 1035 14     Temp 09/26/21 1035 97.8 F (36.6 C)     Temp Source 09/26/21 1035 Oral     SpO2 09/26/21  1035 100 %     Weight --      Height 09/26/21 1032 5\' 5"  (1.651 m)     Head Circumference --      Peak Flow --      Pain Score 09/26/21 1032 6  Pain Loc --      Pain Edu? --      Excl. in GC? --    No data found.  Updated Vital Signs BP 114/78 (BP Location: Right Arm)   Pulse 80   Temp 97.8 F (36.6 C) (Oral)   Resp 14   Ht 5\' 5"  (1.651 m)   LMP 09/12/2021 (Approximate)   SpO2 100%   BMI 38.91 kg/m   Physical Exam Vitals and nursing note reviewed.  Constitutional:      General: She is not in acute distress.    Appearance: Normal appearance. She is not ill-appearing, toxic-appearing or diaphoretic.  HENT:     Head: Normocephalic and atraumatic.     Nose: Nose normal.     Mouth/Throat:     Mouth: Mucous membranes are moist.  Eyes:     Extraocular Movements: Extraocular movements intact.     Pupils: Pupils are equal, round, and reactive to light.  Cardiovascular:     Rate and Rhythm: Normal rate and regular rhythm.     Heart sounds: Normal heart sounds.  Pulmonary:     Effort: Pulmonary effort is normal.     Breath sounds: Normal breath sounds.  Abdominal:     General: Bowel sounds are normal. There is no distension.     Palpations: Abdomen is soft. There is no mass.     Tenderness: There is no abdominal tenderness. There is no right CVA tenderness, left CVA tenderness, guarding or rebound.     Hernia: No hernia is present.  Musculoskeletal:     Cervical back: Normal range of motion and neck supple.  Lymphadenopathy:     Cervical: No cervical adenopathy.  Skin:    General: Skin is warm.  Neurological:     Mental Status: She is alert and oriented to person, place, and time.     UC Treatments / Results  Labs (all labs ordered are listed, but only abnormal results are displayed) Labs Reviewed  URINALYSIS, COMPLETE (UACMP) WITH MICROSCOPIC - Abnormal; Notable for the following components:      Result Value   Ketones, ur TRACE (*)    Bacteria, UA FEW (*)     All other components within normal limits    EKG   Radiology No results found.  Procedures Procedures (including critical care time)  Medications Ordered in UC Medications - No data to display  Initial Impression / Assessment and Plan / UC Course  I have reviewed the triage vital signs and the nursing notes.  Pertinent labs & imaging results that were available during my care of the patient were reviewed by me and considered in my medical decision making (see chart for details).     New.  We discussed her lab results along with symptoms.  We are going to treat to cover for suspected urinary tract infection and also for vaginal candidiasis.  Discussed how to take the medication along with common potential side effects and precautions.  She can continue her hydration with water along with pre and probiotics.  Follow-up PRN.  Final Clinical Impressions(s) / UC Diagnoses   Final diagnoses:  None   Discharge Instructions   None    ED Prescriptions   None    PDMP not reviewed this encounter.   09/14/2021, Rushie Chestnut 09/26/21 1144

## 2021-09-26 NOTE — ED Triage Notes (Signed)
Patient c/o burning when urinating that started on Wed.  Patient states she has a lot of pressure in her bladder and back pain that started this morning.

## 2021-09-27 ENCOUNTER — Encounter: Payer: Self-pay | Admitting: Obstetrics and Gynecology

## 2021-10-05 ENCOUNTER — Encounter: Payer: BC Managed Care – PPO | Admitting: Obstetrics and Gynecology

## 2021-10-20 ENCOUNTER — Encounter: Payer: Self-pay | Admitting: Family

## 2021-10-27 ENCOUNTER — Ambulatory Visit (INDEPENDENT_AMBULATORY_CARE_PROVIDER_SITE_OTHER): Payer: BC Managed Care – PPO | Admitting: Family

## 2021-10-27 ENCOUNTER — Other Ambulatory Visit: Payer: Self-pay

## 2021-10-27 ENCOUNTER — Encounter: Payer: Self-pay | Admitting: Family

## 2021-10-27 VITALS — BP 102/70 | HR 75 | Temp 98.0°F | Ht 65.0 in | Wt 218.8 lb

## 2021-10-27 DIAGNOSIS — G43109 Migraine with aura, not intractable, without status migrainosus: Secondary | ICD-10-CM

## 2021-10-27 DIAGNOSIS — E669 Obesity, unspecified: Secondary | ICD-10-CM | POA: Diagnosis not present

## 2021-10-27 MED ORDER — TOPIRAMATE 25 MG PO TABS: 25.0000 mg | ORAL_TABLET | Freq: Two times a day (BID) | ORAL | 2 refills | Status: DC

## 2021-10-27 MED ORDER — TIRZEPATIDE 5 MG/0.5ML ~~LOC~~ SOAJ: 5.0000 mg | SUBCUTANEOUS | 2 refills | Status: DC

## 2021-10-27 NOTE — Progress Notes (Signed)
Subjective:    Patient ID: Andrea Roberson, female    DOB: 1986/01/25, 36 y.o.   MRN: [id number]  CC: Andrea Roberson is a 36 y.o. female who presents today for follow up.   HPI: Feels well today No new complaints  Migraine-She is compliant with propranolol 60 mg ER, Imitrex 100 mg as needed. HA are well controlled.   Obesity-compliant with metformin 500 mg twice daily, Topamax 25 mg twice daily.   Tolerating mounjaro 2.5mg  and would like to increase dose.   She has lost 18 lbs and feel likes weight loss has plateaued.     HISTORY:  Past Medical History:  Diagnosis Date   Anxiety    Asthma    Bipolar disorder (HCC)    Depression    Frequent headaches    GERD (gastroesophageal reflux disease)    Gestational diabetes    History of gestational diabetes 2016   Hyperlipidemia    Migraines    Obesity (BMI 35.0-39.9 without comorbidity) 12/24/2015   Tarsal coalition of left foot    TMJ (dislocation of temporomandibular joint)    UTI (urinary tract infection)    Wears contact lenses    Past Surgical History:  Procedure Laterality Date   CESAREAN SECTION  2015   CESAREAN SECTION WITH BILATERAL TUBAL LIGATION N/A 11/19/2018   Procedure: CESAREAN SECTION WITH BILATERAL TUBAL LIGATION;  Surgeon: Hildred Laser, MD;  Location: [facility];  Service: Obstetrics;  Laterality: N/A;  TOB 0822 APGAR 8/8     ESOPHAGOGASTRODUODENOSCOPY (EGD) WITH PROPOFOL N/A 08/30/2017   Procedure: ESOPHAGOGASTRODUODENOSCOPY (EGD) WITH PROPOFOL;  Surgeon: Toney Reil, MD;  Location: [facility];  Service: Endoscopy;  Laterality: N/A;   WISDOM TOOTH EXTRACTION     Family History  Problem Relation Age of Onset   Heart disease Mother    Diabetes Mother    Anxiety disorder Mother    Arthritis Mother    Skin cancer Mother    Diabetes Father    Skin cancer Father    Arthritis Father    Colon polyps Father    Peptic Ulcer Disease Father    Colon polyps Brother    Heart disease  Maternal Grandmother    Colon cancer Paternal Grandfather    Colon cancer Paternal Aunt    Thyroid cancer Neg Hx     Allergies: Vyleesi [bremelanotide] Current Outpatient Medications on File Prior to Visit  Medication Sig Dispense Refill   busPIRone (BUSPAR) 30 MG tablet Take 1 tablet (30 mg total) by mouth daily. 90 tablet 1   cetirizine (ZYRTEC) 10 MG tablet Take 10 mg by mouth daily as needed for allergies.      FLUoxetine (PROZAC) 40 MG capsule Take 1 capsule (40 mg total) by mouth daily. 90 capsule 1   fluticasone (FLONASE) 50 MCG/ACT nasal spray Place 2 sprays into both nostrils daily as needed for allergies.      hydrOXYzine (ATARAX/VISTARIL) 25 MG tablet Take 0.5-1 tablets (12.5-25 mg total) by mouth 2 (two) times daily as needed. For anxiety, sleep 60 tablet 1   meloxicam (MOBIC) 15 MG tablet Take 1 tablet (15 mg total) by mouth daily. 30 tablet 3   metFORMIN (GLUCOPHAGE) 500 MG tablet TAKE 1 TABLET BY MOUTH TWICE DAILY 180 tablet 3   minoxidil (ROGAINE WOMENS) 2 % external solution Apply topically 2 (two) times daily.     mometasone (ELOCON) 0.1 % lotion SMARTSIG:4 Drop(s) In Ear(s) Every Night PRN     propranolol ER (INDERAL  LA) 60 MG 24 hr capsule Take 1 capsule (60 mg total) by mouth daily. 90 capsule 3   scopolamine (TRANSDERM-SCOP) 1 MG/3DAYS Place 1 patch (1.5 mg total) onto the skin every 3 (three) days. 10 patch 0   SUMAtriptan (IMITREX) 100 MG tablet Take 1 tablet earliest onset of migraine.  May repeat in 2 hours if headache persists or recurs.  Maximum 2 tablets in 24 hours. 10 tablet 5   UNABLE TO FIND Viviscal Pro for hair loss.     Current Facility-Administered Medications on File Prior to Visit  Medication Dose Route Frequency Provider Last Rate Last Admin   betamethasone acetate-betamethasone sodium phosphate (CELESTONE) injection 3 mg  3 mg Intra-articular Once Felecia Shelling, DPM        Social History   Tobacco Use   Smoking status: Former    Packs/day:  1.50    Years: 8.00    Pack years: 12.00    Types: Cigarettes    Quit date: 09/16/2012    Years since quitting: 9.1   Smokeless tobacco: Never  Vaping Use   Vaping Use: Former  Substance Use Topics   Alcohol use: Not Currently    Comment: 2 glasswine/month   Drug use: No    Review of Systems  Constitutional:  Negative for chills and fever.  Respiratory:  Negative for cough.   Cardiovascular:  Negative for chest pain and palpitations.  Gastrointestinal:  Negative for nausea and vomiting.  Neurological:  Negative for headaches (well controlled).     Objective:    BP 102/70 (BP Location: Left Arm, Patient Position: Sitting, Cuff Size: Large)    Pulse 75    Temp 98 F (36.7 C) (Oral)    Ht 5\' 5"  (1.651 m)    Wt 218 lb 12.8 oz (99.2 kg)    SpO2 98%    BMI 36.41 kg/m  BP Readings from Last 3 Encounters:  10/27/21 102/70  09/26/21 114/78  09/13/21 110/76   Wt Readings from Last 3 Encounters:  10/27/21 218 lb 12.8 oz (99.2 kg)  09/13/21 233 lb 12.8 oz (106.1 kg)  08/03/21 231 lb 14.4 oz (105.2 kg)    Physical Exam Vitals reviewed.  Constitutional:      Appearance: She is well-developed.  Eyes:     Conjunctiva/sclera: Conjunctivae normal.  Cardiovascular:     Rate and Rhythm: Normal rate and regular rhythm.     Pulses: Normal pulses.     Heart sounds: Normal heart sounds.  Pulmonary:     Effort: Pulmonary effort is normal.     Breath sounds: Normal breath sounds. No wheezing, rhonchi or rales.  Skin:    General: Skin is warm and dry.  Neurological:     Mental Status: She is alert.  Psychiatric:        Speech: Speech normal.        Behavior: Behavior normal.        Thought Content: Thought content normal.       Assessment & Plan:   Problem List Items Addressed This Visit       Cardiovascular and Mediastinum   Migraines    Chronic, headaches remain well controlled. Continue propranolol 60 mg ER, Imitrex 100 mg as needed.       Relevant Medications    topiramate (TOPAMAX) 25 MG tablet     Other   Obesity (BMI 35.0-39.9 without comorbidity) - Primary    Weight loss of 18lbs and congratulated patient on her hard work.  As weight has plateaued, we will go ahead and increase mounjaro to 5mg .  She will continue metformin 500 mg twice daily, Topamax 25 mg twice daily.  We will follow with you discussed decreasing and/or discontinuing either metformin or topamax as likely doesn't need both for continued weight loss as we approach maintenance.      Relevant Medications   topiramate (TOPAMAX) 25 MG tablet   tirzepatide (MOUNJARO) 5 MG/0.5ML Pen   Other Relevant Orders   IBC + Ferritin     I have discontinued Andrea Roberson's tirzepatide, nitrofurantoin (macrocrystal-monohydrate), and fluconazole. I have also changed her topiramate. Additionally, I am having her start on tirzepatide. Lastly, I am having her maintain her fluticasone, cetirizine, hydrOXYzine, mometasone, scopolamine, SUMAtriptan, UNABLE TO FIND, Rogaine Womens, busPIRone, FLUoxetine, metFORMIN, meloxicam, and propranolol ER. We will continue to administer betamethasone acetate-betamethasone sodium phosphate.   Meds ordered this encounter  Medications   topiramate (TOPAMAX) 25 MG tablet    Sig: Take 1 tablet (25 mg total) by mouth 2 (two) times daily.    Dispense:  120 tablet    Refill:  2    Order Specific Question:   Supervising Provider    Answer:   Duncan DullULLO, TERESA L [[id number]]   tirzepatide (MOUNJARO) 5 MG/0.5ML Pen    Sig: Inject 5 mg into the skin once a week.    Dispense:  6 mL    Refill:  2    Order Specific Question:   Supervising Provider    Answer:   Sherlene ShamsULLO, TERESA L [[id number]]    Return precautions given.   Risks, benefits, and alternatives of the medications and treatment plan prescribed today were discussed, and patient expressed understanding.   Education regarding symptom management and diagnosis given to patient on AVS.  Continue to follow with Allegra GranaArnett,  Rahma Meller G, FNP for routine health maintenance.   Andrea Roberson and I agreed with plan.   Andrea PlowmanMargaret Carron Mcmurry, FNP

## 2021-10-27 NOTE — Patient Instructions (Addendum)
Congratulations on your weight loss.  That is fantastic !  increase mounjaro to 5mg   Nice to see you!

## 2021-10-29 NOTE — Assessment & Plan Note (Signed)
Weight loss of 18lbs and congratulated patient on her hard work.  As weight has plateaued, we will go ahead and increase mounjaro to 5mg .  She will continue metformin 500 mg twice daily, Topamax 25 mg twice daily.  We will follow with you discussed decreasing and/or discontinuing either metformin or topamax as likely doesn't need both for continued weight loss as we approach maintenance.

## 2021-10-29 NOTE — Assessment & Plan Note (Signed)
Chronic, headaches remain well controlled. Continue propranolol 60 mg ER, Imitrex 100 mg as needed.

## 2021-11-19 ENCOUNTER — Encounter: Payer: Self-pay | Admitting: Family

## 2021-11-19 ENCOUNTER — Telehealth: Payer: Self-pay | Admitting: Family

## 2021-11-19 NOTE — Telephone Encounter (Signed)
Call patient I received [facility] note that Andrea Roberson was denied.  Was she able to pick up his last prescription?  It appears her formulary alternatives are Ozempic, Rybelsus Trulicity and Victoza however they may still require prior authorization.    Would she like me to try 1 of these above instead of mounjaro?

## 2021-11-22 ENCOUNTER — Ambulatory Visit: Admission: EM | Admit: 2021-11-22 | Discharge: 2021-11-22 | Payer: BC Managed Care – PPO

## 2021-11-22 ENCOUNTER — Emergency Department: Payer: BC Managed Care – PPO

## 2021-11-22 ENCOUNTER — Other Ambulatory Visit: Payer: Self-pay

## 2021-11-22 ENCOUNTER — Emergency Department
Admission: EM | Admit: 2021-11-22 | Discharge: 2021-11-22 | Disposition: A | Discharge status: Home / Self Care | Payer: BC Managed Care – PPO | Attending: Emergency Medicine | Admitting: Emergency Medicine

## 2021-11-22 DIAGNOSIS — R1013 Epigastric pain: Secondary | ICD-10-CM

## 2021-11-22 DIAGNOSIS — Z20822 Contact with and (suspected) exposure to covid-19: Secondary | ICD-10-CM | POA: Diagnosis not present

## 2021-11-22 DIAGNOSIS — B349 Viral infection, unspecified: Secondary | ICD-10-CM | POA: Diagnosis not present

## 2021-11-22 LAB — RESP PANEL BY RT-PCR (FLU A&B, COVID) ARPGX2
Influenza A by PCR: NEGATIVE
Influenza B by PCR: NEGATIVE
SARS Coronavirus 2 by RT PCR: NEGATIVE

## 2021-11-22 LAB — URINALYSIS, ROUTINE W REFLEX MICROSCOPIC
Bilirubin Urine: NEGATIVE
Glucose, UA: NEGATIVE mg/dL
Ketones, ur: 15 mg/dL — AB
Leukocytes,Ua: NEGATIVE
Nitrite: NEGATIVE
Protein, ur: NEGATIVE mg/dL
Specific Gravity, Urine: 1.02 (ref 1.005–1.030)
pH: 5.5 (ref 5.0–8.0)

## 2021-11-22 LAB — POC URINE PREG, ED: Preg Test, Ur: NEGATIVE

## 2021-11-22 LAB — CBC
HCT: 39.3 % (ref 36.0–46.0)
Hemoglobin: 13 g/dL (ref 12.0–15.0)
MCH: 28.7 pg (ref 26.0–34.0)
MCHC: 33.1 g/dL (ref 30.0–36.0)
MCV: 86.8 fL (ref 80.0–100.0)
Platelets: 310 10*3/uL (ref 150–400)
RBC: 4.53 MIL/uL (ref 3.87–5.11)
RDW: 11.8 % (ref 11.5–15.5)
WBC: 8.5 10*3/uL (ref 4.0–10.5)
nRBC: 0 % (ref 0.0–0.2)

## 2021-11-22 LAB — COMPREHENSIVE METABOLIC PANEL
ALT: 18 U/L (ref 0–44)
AST: 19 U/L (ref 15–41)
Albumin: 4.1 g/dL (ref 3.5–5.0)
Alkaline Phosphatase: 81 U/L (ref 38–126)
Anion gap: 8 (ref 5–15)
BUN: 11 mg/dL (ref 6–20)
CO2: 24 mmol/L (ref 22–32)
Calcium: 8.7 mg/dL — ABNORMAL LOW (ref 8.9–10.3)
Chloride: 104 mmol/L (ref 98–111)
Creatinine, Ser: 0.73 mg/dL (ref 0.44–1.00)
GFR, Estimated: >60 mL/min (ref >60)
Glucose, Bld: 85 mg/dL (ref 70–99)
Potassium: 3.2 mmol/L — ABNORMAL LOW (ref 3.5–5.1)
Sodium: 136 mmol/L (ref 135–145)
Total Bilirubin: 0.7 mg/dL (ref 0.3–1.2)
Total Protein: 7.5 g/dL (ref 6.5–8.1)

## 2021-11-22 LAB — TROPONIN I (HIGH SENSITIVITY): Troponin I (High Sensitivity): 2 ng/L (ref <18)

## 2021-11-22 LAB — LIPASE, BLOOD: Lipase: 37 U/L (ref 11–51)

## 2021-11-22 MED ORDER — ONDANSETRON HCL 4 MG/2ML IJ SOLN
4.0000 mg | Freq: Once | INTRAMUSCULAR | Status: AC
  Administered 2021-11-22: 4 mg via INTRAVENOUS
  Filled 2021-11-22: qty 2

## 2021-11-22 MED ORDER — PANTOPRAZOLE SODIUM 40 MG IV SOLR
40.0000 mg | Freq: Once | INTRAVENOUS | Status: AC
  Administered 2021-11-22: 40 mg via INTRAVENOUS
  Filled 2021-11-22: qty 40

## 2021-11-22 MED ORDER — POTASSIUM CHLORIDE CRYS ER 20 MEQ PO TBCR
40.0000 meq | EXTENDED_RELEASE_TABLET | Freq: Once | ORAL | Status: AC
  Administered 2021-11-22: 40 meq via ORAL
  Filled 2021-11-22: qty 2

## 2021-11-22 MED ORDER — SODIUM CHLORIDE 0.9 % IV BOLUS
1000.0000 mL | Freq: Once | INTRAVENOUS | Status: AC
  Administered 2021-11-22: 1000 mL via INTRAVENOUS

## 2021-11-22 MED ORDER — ONDANSETRON 4 MG PO TBDP: 4.0000 mg | ORAL_TABLET | Freq: Three times a day (TID) | ORAL | 0 refills | Status: AC | PRN

## 2021-11-22 MED ORDER — KETOROLAC TROMETHAMINE 30 MG/ML IJ SOLN
15.0000 mg | Freq: Once | INTRAMUSCULAR | Status: AC
  Administered 2021-11-22: 15 mg via INTRAVENOUS
  Filled 2021-11-22: qty 1

## 2021-11-22 MED ORDER — PANTOPRAZOLE SODIUM 20 MG PO TBEC: 20.0000 mg | DELAYED_RELEASE_TABLET | Freq: Every day | ORAL | 0 refills | Status: DC

## 2021-11-22 MED ORDER — LIDOCAINE VISCOUS HCL 2 % MT SOLN
15.0000 mL | Freq: Once | OROMUCOSAL | Status: AC
  Administered 2021-11-22: 15 mL via ORAL
  Filled 2021-11-22: qty 15

## 2021-11-22 MED ORDER — ALUM & MAG HYDROXIDE-SIMETH 200-200-20 MG/5ML PO SUSP
30.0000 mL | Freq: Once | ORAL | Status: AC
  Administered 2021-11-22: 30 mL via ORAL
  Filled 2021-11-22: qty 30

## 2021-11-22 NOTE — Discharge Instructions (Addendum)
On repeat assessments he did not have any pain in your lower abdomen.  If you develop any pain in this area please return to the ER immediately for repeat evaluation.  However at this time your work-up thus far has been reassuring and you are tolerating eating and felt comfortable with discharge home

## 2021-11-22 NOTE — ED Triage Notes (Signed)
Patient presents to Urgent Care with complaints of  severe abdominal pain that radiates to the back, nausea, and loss of appetite since Thursday. She noted black tarry stool since Friday. She states no improvement in symptoms.   Denies fever.

## 2021-11-22 NOTE — ED Provider Notes (Signed)
[facility] Provider Note    Event Date/Time   First MD Initiated Contact with Patient 11/22/21 1614     (approximate)   History   Abdominal Pain   HPI  Andrea Roberson is a 36 y.o. female who comes in for urgent care for severe abdominal pain that radiates into her back, nausea and loss of appetite since Thursday.  She reports that her symptoms have been going on for 5 days.  She reports pain in her upper abdomen going into her back and a little bit up into her chest.  Denies really any shortness of breath or coughing.  She reports feeling nauseous but not vomiting but just really not been able to eat much since Thursday.  She does report some diarrhea but reports that it slowed down.  It was a little black in nature so urgent care wanted her to come here to get her hemoglobin checked.  She denies any stooling today.  She reports decreased urination just secondary to not eating and drinking as much.  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  Patient's last menstrual period was a week ago.  Denies any new sexual contacts or concerns for STDs denies any vaginal discharge or lower abdominal pain   Physical Exam   Triage Vital Signs: Blood pressure 128/76, pulse 72, temperature 98 F (36.7 C), resp. rate 18, last menstrual period 11/08/2021, SpO2 100 %.  Most recent vital signs: Vitals:   11/22/21 1617  BP: 123/74  Pulse: 91  Resp: 18  Temp: 98 F (36.7 C)  SpO2: 99%     General: Awake, no distress.  CV:  Good peripheral perfusion. Resp:  Normal effort Abd:  No distention.  Some minimal reported epigastric tenderness without any rebound or guarding Other:  Able to move her arms and legs without any issues.  Ambulates to the bathroom   ED Results / Procedures / Treatments   Labs (all labs ordered are listed, but only abnormal results are displayed) Labs Reviewed  COMPREHENSIVE METABOLIC PANEL -  Abnormal; Notable for the following components:      Result Value   Potassium 3.2 (*)    Calcium 8.7 (*)    All other components within normal limits  URINALYSIS, ROUTINE W REFLEX MICROSCOPIC - Abnormal; Notable for the following components:   APPearance HAZY (*)    Hgb urine dipstick MODERATE (*)    Ketones, ur 15 (*)    Bacteria, UA MANY (*)    All other components within normal limits  RESP PANEL BY RT-PCR (FLU A&B, COVID) ARPGX2  LIPASE, BLOOD  CBC  POC URINE PREG, ED  TROPONIN I (HIGH SENSITIVITY)     EKG  My interpretation of EKG: Normal sinus rate of 76 without any ST elevation, some T wave inversion in lead III, normal intervals   RADIOLOGY I have reviewed the xray personally and agree with radiology read no pneumonia  Ultrasound no cholelithiasis   PROCEDURES:  Critical Care performed: No  Procedures   MEDICATIONS ORDERED IN ED: Medications  sodium chloride 0.9 % bolus 1,000 mL (0 mLs Intravenous Stopped 11/22/21 1841)  ondansetron (ZOFRAN) injection 4 mg (4 mg Intravenous Given 11/22/21 1742)  pantoprazole (PROTONIX) injection 40 mg (40 mg Intravenous Given 11/22/21 1729)  ketorolac (TORADOL) 30 MG/ML injection 15 mg (15 mg Intravenous Given 11/22/21 1831)  alum & mag hydroxide-simeth (MAALOX/MYLANTA) 200-200-20 MG/5ML suspension 30 mL (30 mLs Oral Given 11/22/21  1833)    And  lidocaine (XYLOCAINE) 2 % viscous mouth solution 15 mL (15 mLs Oral Given 11/22/21 1833)  potassium chloride SA (KLOR-CON M) CR tablet 40 mEq (40 mEq Oral Given 11/22/21 2021)     IMPRESSION / MDM / ASSESSMENT AND PLAN / ED COURSE  I reviewed the triage vital signs and the nursing notes.                              Differential diagnosis includes, but is not limited to, GI bleed, COVID, flu, given the upper abdominal tenderness will get ultrasound to make sure no evidence of gallstones.  We will get labs to make sure no electrolyte abnormalities, AKI, UTI   We will give patient some IV  fluids, IV Zofran, IV Protonix to help with symptoms.  COVID, flu neg UA negative Lipase normal CMP slightly low potassium aggressive oral repletion CBC normal UA with some small amount of ketones and bacteria but no WBCs.  She denies any urinary symptoms.  Will send for culture in case she develops symptoms  Repeat evaluation she continues to deny any lower abdominal pain.  Given this, continued epigastric pain I gave her a GI cocktail and some Toradol  Repeat assessment patient continues to deny any lower abdominal pain we discussed the chance of appendicitis but she continues not have any right lower quadrant abdominal pain.  We elected to hold off on CT at this time given her pain is mostly now resolved.  She understands return cautions in regards to appendicitis but this time I very low suspicion given afebrile, normal white count and no right lower quadrant pain.  Patient is tolerating p.o.  Abdomen is soft and nontender she is reports feeling much better and feels comfortable with discharge home.  We will give some Zofran and PPI.  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  I did do a rectal exam with brown stool and Hemoccult negative    The patient is on the cardiac monitor to evaluate for evidence of arrhythmia and/or significant heart rate changes.  FINAL CLINICAL IMPRESSION(S) / ED DIAGNOSES   Final diagnoses:  Epigastric abdominal pain  Viral illness     Rx / DC Orders   ED Discharge Orders          Ordered    ondansetron (ZOFRAN-ODT) 4 MG disintegrating tablet  Every 8 hours PRN        11/22/21 2038    pantoprazole (PROTONIX) 20 MG tablet  Daily        11/22/21 2038             Note:  This document was prepared using Dragon voice recognition software and may include unintentional dictation errors.   Concha Se, MD 11/22/21 2039

## 2021-11-22 NOTE — ED Triage Notes (Signed)
Pt comes with c/o nausea, abdominal pain and back pain since last Thursday.

## 2021-11-22 NOTE — ED Notes (Signed)
E-signature pad unavailable - Pt verbalized understanding of D/C information - no additional concerns at this time.  

## 2021-11-22 NOTE — Telephone Encounter (Signed)
LMTCB

## 2021-11-23 ENCOUNTER — Encounter: Payer: Self-pay | Admitting: Family

## 2021-11-23 ENCOUNTER — Telehealth (INDEPENDENT_AMBULATORY_CARE_PROVIDER_SITE_OTHER): Payer: BC Managed Care – PPO | Admitting: Family

## 2021-11-23 VITALS — Ht 65.0 in | Wt 204.0 lb

## 2021-11-23 DIAGNOSIS — R110 Nausea: Secondary | ICD-10-CM | POA: Diagnosis not present

## 2021-11-23 DIAGNOSIS — R195 Other fecal abnormalities: Secondary | ICD-10-CM | POA: Diagnosis not present

## 2021-11-23 DIAGNOSIS — R1013 Epigastric pain: Secondary | ICD-10-CM

## 2021-11-23 DIAGNOSIS — E669 Obesity, unspecified: Secondary | ICD-10-CM

## 2021-11-23 MED ORDER — PANTOPRAZOLE SODIUM 20 MG PO TBEC: 20.0000 mg | DELAYED_RELEASE_TABLET | Freq: Every day | ORAL | 1 refills | Status: DC

## 2021-11-23 MED ORDER — TIRZEPATIDE 2.5 MG/0.5ML ~~LOC~~ SOAJ: 2.5000 mg | SUBCUTANEOUS | 2 refills | Status: DC

## 2021-11-23 NOTE — Progress Notes (Deleted)
Subjective:    Patient ID: Andrea Roberson, female    DOB: 1986-09-25, 36 y.o.   MRN: [id number]  CC: Andrea Roberson is a 36 y.o. female who presents today for follow up.   HPI: HPI Presented yesterday to the emergency room for severe abdominal pain, diarrhea, nausea, loss of appetite, stools black in color. Elected to proceed with CT abdomen and pelvis.  Given IV Zofran and Protonix.  Prescribed Zofran and protonix 20mg   Chest x-ray yesterday showed no active disease  Right upper quadrant ultrasound without gallstones, focal lesion of the liver.  No evidence of cholelithiasis or acute cholecystitis  Negative COVID, troponin Hemoglobin of 13 Potassium 3.2 Urine culture remains in process HISTORY:  Past Medical History:  Diagnosis Date   Anxiety    Asthma    Bipolar disorder (HCC)    Depression    Frequent headaches    GERD (gastroesophageal reflux disease)    Gestational diabetes    History of gestational diabetes 2016   Hyperlipidemia    Migraines    Obesity (BMI 35.0-39.9 without comorbidity) 12/24/2015   Tarsal coalition of left foot    TMJ (dislocation of temporomandibular joint)    UTI (urinary tract infection)    Wears contact lenses    Past Surgical History:  Procedure Laterality Date   CESAREAN SECTION  2015   CESAREAN SECTION WITH BILATERAL TUBAL LIGATION N/A 11/19/2018   Procedure: CESAREAN SECTION WITH BILATERAL TUBAL LIGATION;  Surgeon: 01/18/2019, MD;  Location: [facility];  Service: Obstetrics;  Laterality: N/A;  TOB 0822 APGAR 8/8     ESOPHAGOGASTRODUODENOSCOPY (EGD) WITH PROPOFOL N/A 08/30/2017   Procedure: ESOPHAGOGASTRODUODENOSCOPY (EGD) WITH PROPOFOL;  Surgeon: 09/01/2017, MD;  Location: [facility];  Service: Endoscopy;  Laterality: N/A;   WISDOM TOOTH EXTRACTION     Family History  Problem Relation Age of Onset   Heart disease Mother    Diabetes Mother    Anxiety disorder Mother    Arthritis Mother    Skin cancer Mother     Diabetes Father    Skin cancer Father    Arthritis Father    Colon polyps Father    Peptic Ulcer Disease Father    Colon polyps Brother    Heart disease Maternal Grandmother    Colon cancer Paternal Grandfather    Colon cancer Paternal Aunt    Thyroid cancer Neg Hx     Allergies: Vyleesi [bremelanotide] Current Outpatient Medications on File Prior to Visit  Medication Sig Dispense Refill   busPIRone (BUSPAR) 30 MG tablet Take 1 tablet (30 mg total) by mouth daily. 90 tablet 1   cetirizine (ZYRTEC) 10 MG tablet Take 10 mg by mouth daily as needed for allergies.      FLUoxetine (PROZAC) 40 MG capsule Take 1 capsule (40 mg total) by mouth daily. 90 capsule 1   fluticasone (FLONASE) 50 MCG/ACT nasal spray Place 2 sprays into both nostrils daily as needed for allergies.      hydrOXYzine (ATARAX/VISTARIL) 25 MG tablet Take 0.5-1 tablets (12.5-25 mg total) by mouth 2 (two) times daily as needed. For anxiety, sleep 60 tablet 1   meloxicam (MOBIC) 15 MG tablet Take 1 tablet (15 mg total) by mouth daily. 30 tablet 3   metFORMIN (GLUCOPHAGE) 500 MG tablet TAKE 1 TABLET BY MOUTH TWICE DAILY 180 tablet 3   minoxidil (ROGAINE WOMENS) 2 % external solution Apply topically 2 (two) times daily.     mometasone (ELOCON) 0.1 %  lotion SMARTSIG:4 Drop(s) In Ear(s) Every Night PRN     ondansetron (ZOFRAN-ODT) 4 MG disintegrating tablet Take 1 tablet (4 mg total) by mouth every 8 (eight) hours as needed for up to 3 days for nausea or vomiting. 8 tablet 0   pantoprazole (PROTONIX) 20 MG tablet Take 1 tablet (20 mg total) by mouth daily for 14 days. 14 tablet 0   propranolol ER (INDERAL LA) 60 MG 24 hr capsule Take 1 capsule (60 mg total) by mouth daily. 90 capsule 3   scopolamine (TRANSDERM-SCOP) 1 MG/3DAYS Place 1 patch (1.5 mg total) onto the skin every 3 (three) days. 10 patch 0   SUMAtriptan (IMITREX) 100 MG tablet Take 1 tablet earliest onset of migraine.  May repeat in 2 hours if headache persists or  recurs.  Maximum 2 tablets in 24 hours. 10 tablet 5   tirzepatide (MOUNJARO) 5 MG/0.5ML Pen Inject 5 mg into the skin once a week. 6 mL 2   topiramate (TOPAMAX) 25 MG tablet Take 1 tablet (25 mg total) by mouth 2 (two) times daily. 120 tablet 2   UNABLE TO FIND Viviscal Pro for hair loss.     Current Facility-Administered Medications on File Prior to Visit  Medication Dose Route Frequency Provider Last Rate Last Admin   betamethasone acetate-betamethasone sodium phosphate (CELESTONE) injection 3 mg  3 mg Intra-articular Once Felecia Shelling, DPM        Social History   Tobacco Use   Smoking status: Former    Packs/day: 1.50    Years: 8.00    Pack years: 12.00    Types: Cigarettes    Quit date: 09/16/2012    Years since quitting: 9.1   Smokeless tobacco: Never  Vaping Use   Vaping Use: Former  Substance Use Topics   Alcohol use: Not Currently    Comment: 2 glasswine/month   Drug use: No    Review of Systems    Objective:    LMP 11/08/2021 (Within Days)  BP Readings from Last 3 Encounters:  11/22/21 123/70  11/22/21 117/82  10/27/21 102/70   Wt Readings from Last 3 Encounters:  10/27/21 218 lb 12.8 oz (99.2 kg)  09/13/21 233 lb 12.8 oz (106.1 kg)  08/03/21 231 lb 14.4 oz (105.2 kg)    Physical Exam     Assessment & Plan:   Problem List Items Addressed This Visit   None    I am having Andrea Roberson maintain her fluticasone, cetirizine, hydrOXYzine, mometasone, scopolamine, SUMAtriptan, UNABLE TO FIND, Rogaine Womens, busPIRone, FLUoxetine, metFORMIN, meloxicam, propranolol ER, topiramate, tirzepatide, ondansetron, and pantoprazole. We will continue to administer betamethasone acetate-betamethasone sodium phosphate.   No orders of the defined types were placed in this encounter.   Return precautions given.   Risks, benefits, and alternatives of the medications and treatment plan prescribed today were discussed, and patient expressed understanding.    Education regarding symptom management and diagnosis given to patient on AVS.  Continue to follow with Allegra Grana, FNP for routine health maintenance.   Andrea Roberson and I agreed with plan.   Rennie Plowman, FNP

## 2021-11-23 NOTE — Progress Notes (Signed)
Virtual Visit via Video Note  I connected with@  on 11/24/21 at  9:30 AM EST by a video enabled telemedicine application and verified that I am speaking with the correct person using two identifiers.  Location patient: home Location provider:work  Persons participating in the virtual visit: patient, provider  I discussed the limitations of evaluation and management by telemedicine and the availability of in person appointments. The patient expressed understanding and agreed to proceed.   HPI: She continues to have nausea. Started when increased mounjaro 5mg  , from prior 2.5mg .  Nausea had been 'manageable' when starting mounjaro 5mg . Last dose mounjaro 6 days ago.  She was able to eat soup when she got from ED last night. After eating, she has epigastric 'discomfort' , she states that this was not severe. Associated with decreased appetite, belching and bitter taste in the mouth. She is passing gas. Stool was black tarry color again. She has been taking pepto bismal No fever, dysuria, vomiting, belching, constipation.   She hasnt started protonix and zofran yet; she plans to pick up today.  She has been on mounjaro 5mg  qwk and worried that this may be contributing to symptoms.  Presented yesterday to the emergency room for severe abdominal pain, diarrhea, nausea, loss of appetite, stools black in color. Elected not to proceed with CT abdomen and pelvis.  Given IV Zofran and Protonix.  Prescribed Zofran and protonix 20mg   Chest x-ray yesterday showed no active disease  Right upper quadrant ultrasound without gallstones, focal lesion of the liver.  No evidence of cholelithiasis or acute cholecystitis  Negative COVID, troponin Hemoglobin of 13 Potassium 3.2 Urine culture negative  ROS: See pertinent positives and negatives per HPI.    EXAM:  VITALS per patient if applicable: Ht 5\' 5"  (1.651 m)    Wt 204 lb (92.5 kg)    LMP 11/08/2021 (Within Days)    BMI 33.95 kg/m  BP Readings  from Last 3 Encounters:  11/22/21 123/70  11/22/21 117/82  10/27/21 102/70   Wt Readings from Last 3 Encounters:  11/23/21 204 lb (92.5 kg)  10/27/21 218 lb 12.8 oz (99.2 kg)  09/13/21 233 lb 12.8 oz (106.1 kg)    GENERAL: alert, oriented, appears well and in no acute distress  HEENT: atraumatic, conjunttiva clear, no obvious abnormalities on inspection of external nose and ears  NECK: normal movements of the head and neck  LUNGS: on inspection no signs of respiratory distress, breathing rate appears normal, no obvious gross SOB, gasping or wheezing  CV: no obvious cyanosis  Abdomen: she is able to press on abdomen without pain. Describes abdomen as soft.   MS: moves all visible extremities without noticeable abnormality  PSYCH/NEURO: pleasant and cooperative, no obvious depression or anxiety, speech and thought processing grossly intact  ASSESSMENT AND PLAN:  Discussed the following assessment and plan:  Problem List Items Addressed This Visit       Other   Change in stool    Discussed with patient that change of stool color could be related to use of Pepto-Bismol.  Advised her to stop Pepto-Bismol. Pending stool cards and if stool color doesn't return to normal, advised to let 01/20/22 know right away      Relevant Orders   Fecal occult blood, imunochemical   Dyspepsia - Primary   Relevant Medications   pantoprazole (PROTONIX) 20 MG tablet   Other Relevant Orders   Basic metabolic panel   Urinalysis, Routine w reflex microscopic   Nausea  Patient nontoxic in appearance.  She is afebrile.  f onset of symptoms correlates with increase in Mounjaro and  common side effects.  Differential also includes GERD, gastritis. Advised to start protonix 20 mg take 30 min to one hour ahead of breakfast for approx 6 weeks until we have followup and then we will likely wean off. Advised to start zofran Decrease the mounjaro to 2.5mg  however low threshold to stop if symptoms dont resolve  quickly. Close follow up.       Obesity (BMI 35.0-39.9 without comorbidity)   Relevant Medications   tirzepatide (MOUNJARO) 2.5 MG/0.5ML Pen    -we discussed possible serious and likely etiologies, options for evaluation and workup, limitations of telemedicine visit vs in person visit, treatment, treatment risks and precautions. Pt prefers to treat via telemedicine empirically rather then risking or undertaking an in person visit at this moment.  .   I discussed the assessment and treatment plan with the patient. The patient was provided an opportunity to ask questions and all were answered. The patient agreed with the plan and demonstrated an understanding of the instructions.   The patient was advised to call back or seek an in-person evaluation if the symptoms worsen or if the condition fails to improve as anticipated.   Rennie Plowman, FNP

## 2021-11-23 NOTE — Telephone Encounter (Signed)
Pt scheduled for VV today at 9:30 to discuss. Pt is getting Mounjaro.

## 2021-11-23 NOTE — Patient Instructions (Addendum)
Stop pepto bismul as can affect stool color.   Pick up stool cards from the office  when you can and return to screen for blood.  If you continue to change in stool color, let me know right away.   Start the protonix 20 mg take 30 min to one hour ahead of breakfast. Stay on for 6 weeks until we have followup and then we will likely wean off.    Start zofran  Decrease the mounjaro to 2.5mg  however low threshold to stop if symptoms dont resolve quickly  Please call to schedule follow up. Keep lab appointment on 11/29/21 ( for ferritin, urine, potassium to be checked) and you can pick stool cards that day if you prefer.

## 2021-11-24 ENCOUNTER — Telehealth: Payer: Self-pay | Admitting: Family

## 2021-11-24 ENCOUNTER — Other Ambulatory Visit: Payer: Self-pay

## 2021-11-24 DIAGNOSIS — R110 Nausea: Secondary | ICD-10-CM | POA: Insufficient documentation

## 2021-11-24 DIAGNOSIS — R300 Dysuria: Secondary | ICD-10-CM

## 2021-11-24 LAB — URINE CULTURE: Culture: NO GROWTH

## 2021-11-24 NOTE — Telephone Encounter (Signed)
Call pt Urine culture negative   If she is having urinary complaints, please reorder UA and Urine culture

## 2021-11-24 NOTE — Assessment & Plan Note (Addendum)
Patient nontoxic in appearance.  She is afebrile.  f onset of symptoms correlates with increase in Mounjaro and  common side effects.  Differential also includes GERD, gastritis. Advised to start protonix 20 mg take 30 min to one hour ahead of breakfast for approx 6 weeks until we have followup and then we will likely wean off. Advised to start zofran Decrease the mounjaro to 2.5mg  however low threshold to stop if symptoms dont resolve quickly. Close follow up.

## 2021-11-24 NOTE — Telephone Encounter (Signed)
I called patient & she was still having some pressure but she feels she needs to increase water intake. I did add on a culture for patient & asked that she call back if she feels water intake didn't help or that she wanted to come leave specimen earlier.

## 2021-11-24 NOTE — Assessment & Plan Note (Signed)
Discussed with patient that change of stool color could be related to use of Pepto-Bismol.  Advised her to stop Pepto-Bismol. Pending stool cards and if stool color doesn't return to normal, advised to let us know right away

## 2021-11-26 NOTE — Progress Notes (Signed)
Already called and addressed.

## 2021-11-28 ENCOUNTER — Other Ambulatory Visit: Payer: Self-pay | Admitting: Family

## 2021-11-28 ENCOUNTER — Encounter: Payer: Self-pay | Admitting: Family

## 2021-11-28 DIAGNOSIS — E669 Obesity, unspecified: Secondary | ICD-10-CM

## 2021-11-29 ENCOUNTER — Other Ambulatory Visit (INDEPENDENT_AMBULATORY_CARE_PROVIDER_SITE_OTHER): Payer: BC Managed Care – PPO

## 2021-11-29 ENCOUNTER — Other Ambulatory Visit: Payer: Self-pay

## 2021-11-29 DIAGNOSIS — R300 Dysuria: Secondary | ICD-10-CM | POA: Diagnosis not present

## 2021-11-29 DIAGNOSIS — R1013 Epigastric pain: Secondary | ICD-10-CM

## 2021-11-29 DIAGNOSIS — R195 Other fecal abnormalities: Secondary | ICD-10-CM

## 2021-11-29 DIAGNOSIS — E669 Obesity, unspecified: Secondary | ICD-10-CM

## 2021-11-29 MED ORDER — TIRZEPATIDE 2.5 MG/0.5ML ~~LOC~~ SOAJ: 2.5000 mg | SUBCUTANEOUS | 2 refills | Status: DC

## 2021-11-30 LAB — URINALYSIS, ROUTINE W REFLEX MICROSCOPIC
Bilirubin Urine: NEGATIVE
Hgb urine dipstick: NEGATIVE
Ketones, ur: NEGATIVE
Leukocytes,Ua: NEGATIVE
Nitrite: NEGATIVE
Specific Gravity, Urine: <=1.005 — AB (ref 1.000–1.030)
Total Protein, Urine: NEGATIVE
Urine Glucose: NEGATIVE
Urobilinogen, UA: 0.2 (ref 0.0–1.0)
pH: 6 (ref 5.0–8.0)

## 2021-11-30 LAB — URINE CULTURE
MICRO NUMBER:: 13000554
SPECIMEN QUALITY:: ADEQUATE

## 2021-12-01 LAB — BASIC METABOLIC PANEL
BUN: 10 mg/dL (ref 6–23)
CO2: 28 mEq/L (ref 19–32)
Calcium: 9 mg/dL (ref 8.4–10.5)
Chloride: 103 mEq/L (ref 96–112)
Creatinine, Ser: 0.75 mg/dL (ref 0.40–1.20)
GFR: 102.88 mL/min (ref >60.00)
Glucose, Bld: 78 mg/dL (ref 70–99)
Potassium: 3.8 mEq/L (ref 3.5–5.1)
Sodium: 137 mEq/L (ref 135–145)

## 2021-12-02 ENCOUNTER — Telehealth: Payer: Self-pay | Admitting: Family

## 2021-12-02 ENCOUNTER — Other Ambulatory Visit: Payer: Self-pay

## 2021-12-02 ENCOUNTER — Telehealth: Payer: Self-pay

## 2021-12-02 DIAGNOSIS — R195 Other fecal abnormalities: Secondary | ICD-10-CM

## 2021-12-02 LAB — FECAL OCCULT BLOOD, IMMUNOCHEMICAL: Fecal Occult Bld: POSITIVE — AB

## 2021-12-02 NOTE — Telephone Encounter (Signed)
Critical lab report that patient's I FOB came back positive.  Would you like me to call patient & make urgent GI referral?

## 2021-12-02 NOTE — Telephone Encounter (Addendum)
CRITICAL VALUE STICKER  CRITICAL VALUE: Positive I FOB  RECEIVER (on-site recipient of call): Sharee Pimple  DATE & TIME NOTIFIED: 12/02/2021 9:30am  MESSENGER (representative from lab): Margarita Grizzle  MD NOTIFIED: Dr Derrel Nip in Absence of NP Arnette  TIME OF NOTIFICATION: 9:32am  RESPONSE:

## 2021-12-02 NOTE — Telephone Encounter (Signed)
I spoke with patient & urgent referral placed for [facility].

## 2021-12-02 NOTE — Telephone Encounter (Signed)
This is Margaret's patient.  Her fecal occult blood test was abnormal.  I am recommending that she see a gastroenterologist.  Does she have a preference?

## 2021-12-02 NOTE — Telephone Encounter (Signed)
Pt returning callback. Pt stated she is on her planning period until 12:20pm if you would like to call her back. Pt stated if you get this message after 12:20pm, can you call her back after 3pm. Pt requesting callback

## 2021-12-02 NOTE — Telephone Encounter (Signed)
Called patient LMTCB. 

## 2021-12-03 NOTE — Telephone Encounter (Signed)
Noted Gi referral in place

## 2021-12-03 NOTE — Telephone Encounter (Signed)
Thanks Andrea Roberson, how soon can pt be seen?  She has blood in stool

## 2021-12-06 ENCOUNTER — Other Ambulatory Visit: Payer: Self-pay

## 2021-12-06 DIAGNOSIS — Z1211 Encounter for screening for malignant neoplasm of colon: Secondary | ICD-10-CM

## 2021-12-06 MED ORDER — PEG 3350-KCL-NA BICARB-NACL 420 G PO SOLR: 4000.0000 mL | Freq: Once | ORAL | 0 refills | Status: AC

## 2021-12-06 NOTE — Progress Notes (Signed)
Gastroenterology Pre-Procedure Review  Request Date: 01/07/2022 Requesting Physician: Dr. Allegra Lai   PATIENT REVIEW QUESTIONS: The patient responded to the following health history questions as indicated:    1. Are you having any GI issues? no 2. Do you have a personal history of Polyps? no 3. Do you have a family history of Colon Cancer or Polyps? Brother polyps 4. Diabetes Mellitus? no 5. Joint replacements in the past 12 months?no 6. Major health problems in the past 3 months?no 7. Any artificial heart valves, MVP, or defibrillator?no    MEDICATIONS & ALLERGIES:    Patient reports the following regarding taking any anticoagulation/antiplatelet therapy:   Plavix, Coumadin, Eliquis, Xarelto, Lovenox, Pradaxa, Brilinta, or Effient? no Aspirin? no  Patient confirms/reports the following medications:  Current Outpatient Medications  Medication Sig Dispense Refill   busPIRone (BUSPAR) 30 MG tablet Take 1 tablet (30 mg total) by mouth daily. 90 tablet 1   cetirizine (ZYRTEC) 10 MG tablet Take 10 mg by mouth daily as needed for allergies.      FLUoxetine (PROZAC) 40 MG capsule Take 1 capsule (40 mg total) by mouth daily. 90 capsule 1   fluticasone (FLONASE) 50 MCG/ACT nasal spray Place 2 sprays into both nostrils daily as needed for allergies.      hydrOXYzine (ATARAX/VISTARIL) 25 MG tablet Take 0.5-1 tablets (12.5-25 mg total) by mouth 2 (two) times daily as needed. For anxiety, sleep 60 tablet 1   meloxicam (MOBIC) 15 MG tablet Take 1 tablet (15 mg total) by mouth daily. 30 tablet 3   metFORMIN (GLUCOPHAGE) 500 MG tablet TAKE 1 TABLET BY MOUTH TWICE DAILY 180 tablet 3   minoxidil (ROGAINE WOMENS) 2 % external solution Apply topically 2 (two) times daily.     mometasone (ELOCON) 0.1 % lotion SMARTSIG:4 Drop(s) In Ear(s) Every Night PRN     pantoprazole (PROTONIX) 20 MG tablet Take 1 tablet (20 mg total) by mouth daily. 90 tablet 1   propranolol ER (INDERAL LA) 60 MG 24 hr capsule Take 1  capsule (60 mg total) by mouth daily. 90 capsule 3   scopolamine (TRANSDERM-SCOP) 1 MG/3DAYS Place 1 patch (1.5 mg total) onto the skin every 3 (three) days. 10 patch 0   SUMAtriptan (IMITREX) 100 MG tablet Take 1 tablet earliest onset of migraine.  May repeat in 2 hours if headache persists or recurs.  Maximum 2 tablets in 24 hours. 10 tablet 5   tirzepatide (MOUNJARO) 2.5 MG/0.5ML Pen Inject 2.5 mg into the skin once a week. 2 mL 2   topiramate (TOPAMAX) 25 MG tablet Take 1 tablet (25 mg total) by mouth 2 (two) times daily. 120 tablet 2   UNABLE TO FIND Viviscal Pro for hair loss.     Current Facility-Administered Medications  Medication Dose Route Frequency Provider Last Rate Last Admin   betamethasone acetate-betamethasone sodium phosphate (CELESTONE) injection 3 mg  3 mg Intra-articular Once Felecia Shelling, DPM        Patient confirms/reports the following allergies:  Allergies  Allergen Reactions   Vyleesi [Bremelanotide] Itching, Nausea And Vomiting and Other (See Comments)    Flushing    No orders of the defined types were placed in this encounter.   AUTHORIZATION INFORMATION Primary Insurance: 1D#: Group #:  Secondary Insurance: 1D#: Group #:  SCHEDULE INFORMATION: Date: 01/07/2022 Time: Location:[facility]

## 2022-01-06 ENCOUNTER — Encounter: Payer: Self-pay | Admitting: Gastroenterology

## 2022-01-07 ENCOUNTER — Ambulatory Visit: Payer: BC Managed Care – PPO | Admitting: Anesthesiology

## 2022-01-07 ENCOUNTER — Encounter
Admission: RE | Disposition: A | Discharge status: Home / Self Care | Payer: Self-pay | Source: Home / Self Care | Attending: Gastroenterology

## 2022-01-07 ENCOUNTER — Ambulatory Visit
Admission: RE | Admit: 2022-01-07 | Discharge: 2022-01-07 | Disposition: A | Discharge status: Home / Self Care | Payer: BC Managed Care – PPO | Attending: Gastroenterology | Admitting: Gastroenterology

## 2022-01-07 DIAGNOSIS — K529 Noninfective gastroenteritis and colitis, unspecified: Secondary | ICD-10-CM | POA: Insufficient documentation

## 2022-01-07 DIAGNOSIS — F319 Bipolar disorder, unspecified: Secondary | ICD-10-CM | POA: Insufficient documentation

## 2022-01-07 DIAGNOSIS — R195 Other fecal abnormalities: Secondary | ICD-10-CM | POA: Diagnosis not present

## 2022-01-07 DIAGNOSIS — E669 Obesity, unspecified: Secondary | ICD-10-CM | POA: Diagnosis not present

## 2022-01-07 DIAGNOSIS — Z87891 Personal history of nicotine dependence: Secondary | ICD-10-CM | POA: Insufficient documentation

## 2022-01-07 DIAGNOSIS — F418 Other specified anxiety disorders: Secondary | ICD-10-CM | POA: Diagnosis not present

## 2022-01-07 DIAGNOSIS — K633 Ulcer of intestine: Secondary | ICD-10-CM

## 2022-01-07 DIAGNOSIS — K6389 Other specified diseases of intestine: Secondary | ICD-10-CM | POA: Diagnosis not present

## 2022-01-07 DIAGNOSIS — Z1211 Encounter for screening for malignant neoplasm of colon: Secondary | ICD-10-CM | POA: Diagnosis present

## 2022-01-07 DIAGNOSIS — Z6833 Body mass index (BMI) 33.0-33.9, adult: Secondary | ICD-10-CM | POA: Insufficient documentation

## 2022-01-07 DIAGNOSIS — K644 Residual hemorrhoidal skin tags: Secondary | ICD-10-CM | POA: Insufficient documentation

## 2022-01-07 HISTORY — PX: COLONOSCOPY WITH PROPOFOL: SHX5780

## 2022-01-07 LAB — GLUCOSE, CAPILLARY: Glucose-Capillary: 90 mg/dL (ref 70–99)

## 2022-01-07 LAB — POCT PREGNANCY, URINE: Preg Test, Ur: NEGATIVE

## 2022-01-07 SURGERY — COLONOSCOPY WITH PROPOFOL
Anesthesia: General

## 2022-01-07 MED ORDER — LIDOCAINE HCL (PF) 2 % IJ SOLN
INTRAMUSCULAR | Status: AC
  Filled 2022-01-07: qty 5

## 2022-01-07 MED ORDER — LIDOCAINE HCL (CARDIAC) PF 100 MG/5ML IV SOSY
PREFILLED_SYRINGE | INTRAVENOUS | Status: DC | PRN
Start: 2022-01-07 — End: 2022-01-07
  Administered 2022-01-07: 50 mg via INTRAVENOUS

## 2022-01-07 MED ORDER — PROPOFOL 500 MG/50ML IV EMUL
INTRAVENOUS | Status: AC
  Filled 2022-01-07: qty 50

## 2022-01-07 MED ORDER — PROPOFOL 10 MG/ML IV BOLUS
INTRAVENOUS | Status: DC | PRN
  Administered 2022-01-07: 20 mg via INTRAVENOUS
  Administered 2022-01-07: 80 mg via INTRAVENOUS

## 2022-01-07 MED ORDER — SODIUM CHLORIDE 0.9 % IV SOLN
INTRAVENOUS | Status: DC
  Administered 2022-01-07: 1000 mL via INTRAVENOUS

## 2022-01-07 MED ORDER — PROPOFOL 500 MG/50ML IV EMUL
INTRAVENOUS | Status: DC | PRN
  Administered 2022-01-07: 150 ug/kg/min via INTRAVENOUS

## 2022-01-07 NOTE — Transfer of Care (Signed)
Immediate Anesthesia Transfer of Care Note ? ?Patient: Andrea Roberson ? ?Procedure(s) Performed: COLONOSCOPY WITH PROPOFOL ? ?Patient Location: PACU ? ?Anesthesia Type:General ? ?Level of Consciousness: awake and drowsy ? ?Airway & Oxygen Therapy: Patient Spontanous Breathing ? ?Post-op Assessment: Report given to RN and Post -op Vital signs reviewed and stable ? ?Post vital signs: Reviewed and stable ? ?Last Vitals:  ?Vitals Value Taken Time  ?BP 96/57 01/07/22 0923  ?Temp 35.8 ?C 01/07/22 0923  ?Pulse 78 01/07/22 0923  ?Resp 22 01/07/22 0923  ?SpO2 100 % 01/07/22 0923  ? ? ?Last Pain:  ?Vitals:  ? 01/07/22 0923  ?TempSrc: Temporal  ?PainSc: Asleep  ?   ? ?  ? ?Complications: No notable events documented. ?

## 2022-01-07 NOTE — Op Note (Signed)
[facility] ?Gastroenterology ?Patient Name: Andrea BendCrystal Heyliger ?Procedure Date: 01/07/2022 8:50 AM ?MRN: [id number] ?Account #: [id number] ?Date of Birth: 08/17/1986 ?Admit Type: Outpatient ?Age: 36 ?[facility] ?Gender: Female ?Note Status: Finalized ?Instrument Name: Colonoscope [id number] ?Procedure:             Colonoscopy ?Indications:           This is the patient's first colonoscopy, Positive  ?                       fecal immunochemical test ?Providers:             Toney Reilohini Reddy Quanisha Drewry MD, MD ?Referring MD:          Lyn RecordsMargaret G. Arnett (Referring MD) ?Medicines:             See the Anesthesia note for documentation of the  ?                       administered medications, General Anesthesia ?Complications:         No immediate complications. Estimated blood loss: None. ?Procedure:             Pre-Anesthesia Assessment: ?                       - Prior to the procedure, a History and Physical was  ?                       performed, and patient medications and allergies were  ?                       reviewed. The patient is competent. The risks and  ?                       benefits of the procedure and the sedation options and  ?                       risks were discussed with the patient. All questions  ?                       were answered and informed consent was obtained.  ?                       Patient identification and proposed procedure were  ?                       verified by the physician, the nurse, the  ?                       anesthesiologist, the anesthetist and the technician  ?                       in the pre-procedure area in the procedure room in the  ?                       endoscopy suite. Mental Status Examination: alert and  ?                       oriented. Airway Examination: normal oropharyngeal  ?  airway and neck mobility. Respiratory Examination:  ?                       clear to auscultation. CV Examination: normal.  ?                        Prophylactic Antibiotics: The patient does not require  ?                       prophylactic antibiotics. Prior Anticoagulants: The  ?                       patient has taken no previous anticoagulant or  ?                       antiplatelet agents. ASA Grade Assessment: II - A  ?                       patient with mild systemic disease. After reviewing  ?                       the risks and benefits, the patient was deemed in  ?                       satisfactory condition to undergo the procedure. The  ?                       anesthesia plan was to use general anesthesia.  ?                       Immediately prior to administration of medications,  ?                       the patient was re-assessed for adequacy to receive  ?                       sedatives. The heart rate, respiratory rate, oxygen  ?                       saturations, blood pressure, adequacy of pulmonary  ?                       ventilation, and response to care were monitored  ?                       throughout the procedure. The physical status of the  ?                       patient was re-assessed after the procedure. ?                       After obtaining informed consent, the colonoscope was  ?                       passed under direct vision. Throughout the procedure,  ?                       the patient's blood pressure, pulse, and oxygen  ?  saturations were monitored continuously. The  ?                       Colonoscope was introduced through the anus and  ?                       advanced to the the terminal ileum, with  ?                       identification of the appendiceal orifice and IC  ?                       valve. The colonoscopy was performed without  ?                       difficulty. The patient tolerated the procedure well.  ?                       The quality of the bowel preparation was evaluated  ?                       using the BBPS [facility] Bowel Preparation Scale) with  ?                        scores of: Right Colon = 3, Transverse Colon = 3 and  ?                       Left Colon = 3 (entire mucosa seen well with no  ?                       residual staining, small fragments of stool or opaque  ?                       liquid). The total BBPS score equals 9. ?Findings: ?     The perianal and digital rectal examinations were normal. Pertinent  ?     negatives include normal sphincter tone and no palpable rectal lesions. ?     The terminal ileum contained a few non-bleeding aphthae. No stigmata of  ?     recent bleeding were seen. Biopsies were taken with a cold forceps for  ?     histology. ?     The entire examined colon appeared normal. ?     Non-bleeding external hemorrhoids were found during endoscopy. The  ?     hemorrhoids were small. ?Impression:            - Aphtha in the terminal ileum. Biopsied. ?                       - The entire examined colon is normal. ?                       - Non-bleeding external hemorrhoids. ?Recommendation:        - Discharge patient to home (with escort). ?                       - Resume previous diet today. ?                       -  Continue present medications. ?                       - Await pathology results. ?Procedure Code(s):     --- Professional --- ?                       [phone], Colonoscopy, flexible; with biopsy, single or  ?                       multiple ?Diagnosis Code(s):     --- Professional --- ?                       K63.89, Other specified diseases of intestine ?                       K64.4, Residual hemorrhoidal skin tags ?                       R19.5, Other fecal abnormalities ?CPT copyright 2019 American Medical Association. All rights reserved. ?The codes documented in this report are preliminary and upon coder review may  ?be revised to meet current compliance requirements. ?Dr. Libby Maw ?Niki Payment Alger Memos MD, MD ?01/07/2022 9:22:44 AM ?This report has been signed electronically. ?Number of Addenda: 0 ?Note Initiated On: 01/07/2022 8:50  AM ?Scope Withdrawal Time: 0 hours 14 minutes 0 seconds  ?Total Procedure Duration: 0 hours 17 minutes 47 seconds  ?Estimated Blood Loss:  Estimated blood loss: none. Estimated blood loss: none. ?     [facility] ?

## 2022-01-07 NOTE — H&P (Signed)
?Cephas Darby, MD ?[address]  ?[address]  ?[place], [place] [address]  ?Main: [phone]  ?Fax: [phone] ?Pager: [phone] ? ?Primary Care Physician:  Burnard Hawthorne, FNP ?Primary Gastroenterologist:  Dr. Cephas Darby ? ?Pre-Procedure History & Physical: ?HPI:  Andrea Roberson is a 36 y.o. female is here for an colonoscopy. ?  ?Past Medical History:  ?Diagnosis Date  ? Anxiety   ? Asthma   ? Bipolar disorder ([place])   ? Depression   ? Frequent headaches   ? GERD (gastroesophageal reflux disease)   ? Gestational diabetes   ? History of gestational diabetes 2016  ? Hyperlipidemia   ? Migraines   ? Obesity (BMI 35.0-39.9 without comorbidity) 12/24/2015  ? Tarsal coalition of left foot   ? TMJ (dislocation of temporomandibular joint)   ? UTI (urinary tract infection)   ? Wears contact lenses   ? ? ?Past Surgical History:  ?Procedure Laterality Date  ? CESAREAN SECTION  2015  ? CESAREAN SECTION WITH BILATERAL TUBAL LIGATION N/A 11/19/2018  ? Procedure: CESAREAN SECTION WITH BILATERAL TUBAL LIGATION;  Surgeon: Rubie Maid, MD;  Location: [facility] ORS;  Service: Obstetrics;  Laterality: N/A;  TOB [id number] ?APGAR 8/8 ? ?  ? ESOPHAGOGASTRODUODENOSCOPY (EGD) WITH PROPOFOL N/A 08/30/2017  ? Procedure: ESOPHAGOGASTRODUODENOSCOPY (EGD) WITH PROPOFOL;  Surgeon: Lin Landsman, MD;  Location: [place];  Service: Endoscopy;  Laterality: N/A;  ? TUBAL LIGATION    ? WISDOM TOOTH EXTRACTION    ? ? ?Prior to Admission medications   ?Medication Sig Start Date End Date Taking? Authorizing Provider  ?busPIRone (BUSPAR) 30 MG tablet Take 1 tablet (30 mg total) by mouth daily. 05/18/21   Ursula Alert, MD  ?cetirizine (ZYRTEC) 10 MG tablet Take 10 mg by mouth daily as needed for allergies.     [provider]  ?FLUoxetine (PROZAC) 40 MG capsule Take 1 capsule (40 mg total) by mouth daily. 05/18/21   Ursula Alert, MD  ?fluticasone (FLONASE) 50 MCG/ACT nasal spray Place 2 sprays into both nostrils  daily as needed for allergies.     [provider]  ?hydrOXYzine (ATARAX/VISTARIL) 25 MG tablet Take 0.5-1 tablets (12.5-25 mg total) by mouth 2 (two) times daily as needed. For anxiety, sleep 09/05/19   Ursula Alert, MD  ?meloxicam (MOBIC) 15 MG tablet Take 1 tablet (15 mg total) by mouth daily. 08/24/21   Edrick Kins, DPM  ?metFORMIN (GLUCOPHAGE) 500 MG tablet TAKE 1 TABLET BY MOUTH TWICE DAILY 08/02/21   Edrick Kins, DPM  ?minoxidil (ROGAINE WOMENS) 2 % external solution Apply topically 2 (two) times daily.    [provider]  ?mometasone (ELOCON) 0.1 % lotion SMARTSIG:4 Drop(s) In Ear(s) Every Night PRN 03/06/20   [provider]  ?pantoprazole (PROTONIX) 20 MG tablet Take 1 tablet (20 mg total) by mouth daily. 11/23/21   Burnard Hawthorne, FNP  ?propranolol ER (INDERAL LA) 60 MG 24 hr capsule Take 1 capsule (60 mg total) by mouth daily. 09/13/21   Burnard Hawthorne, FNP  ?scopolamine (TRANSDERM-SCOP) 1 MG/3DAYS Place 1 patch (1.5 mg total) onto the skin every 3 (three) days. 10/22/20   Burnard Hawthorne, FNP  ?SUMAtriptan (IMITREX) 100 MG tablet Take 1 tablet earliest onset of migraine.  May repeat in 2 hours if headache persists or recurs.  Maximum 2 tablets in 24 hours. 12/04/20   Pieter Partridge, DO  ?tirzepatide [facility]) 2.5 MG/0.5ML Pen Inject 2.5 mg into the skin once a  week. 11/29/21   Burnard Hawthorne, FNP  ?topiramate (TOPAMAX) 25 MG tablet Take 1 tablet (25 mg total) by mouth 2 (two) times daily. 10/27/21   Burnard Hawthorne, FNP  ?UNABLE TO FIND Viviscal Pro for hair loss.    [provider]  ? ? ?Allergies as of 12/06/2021 - Review Complete 12/06/2021  ?Allergen Reaction Noted  ? Vyleesi [bremelanotide] Itching, Nausea And Vomiting, and Other (See Comments) 05/26/2020  ? ? ?Family History  ?Problem Relation Age of Onset  ? Heart disease Mother   ? Diabetes Mother   ? Anxiety disorder Mother   ? Arthritis Mother   ? Skin cancer Mother   ? Diabetes Father   ?  Skin cancer Father   ? Arthritis Father   ? Colon polyps Father   ? Peptic Ulcer Disease Father   ? Colon polyps Brother   ? Heart disease Maternal Grandmother   ? Colon cancer Paternal Grandfather   ? Colon cancer Paternal Aunt   ? Thyroid cancer Neg Hx   ? ? ?Social History  ? ?Socioeconomic History  ? Marital status: Married  ?  Spouse name: Not on file  ? Number of children: Not on file  ? Years of education: Not on file  ? Highest education level: Not on file  ?Occupational History  ? Not on file  ?Tobacco Use  ? Smoking status: Former  ?  Packs/day: 1.50  ?  Years: 8.00  ?  Pack years: 12.00  ?  Types: Cigarettes  ?  Quit date: 09/16/2012  ?  Years since quitting: 9.3  ? Smokeless tobacco: Never  ?Vaping Use  ? Vaping Use: Former  ?Substance and Sexual Activity  ? Alcohol use: Not Currently  ?  Comment: 2 glasswine/month  ? Drug use: No  ? Sexual activity: Yes  ?  Birth control/protection: None, Surgical  ?  Comment: tubal  ?Other Topics Concern  ? Not on file  ?Social History Narrative  ? Married  ? 73 yo son , 5 month daughter  ? Teacher- pre K  ? Right handed  ?   ? ?Social Determinants of Health  ? ?Financial Resource Strain: Not on file  ?Food Insecurity: Not on file  ?Transportation Needs: Not on file  ?Physical Activity: Not on file  ?Stress: Not on file  ?Social Connections: Not on file  ?Intimate Partner Violence: Not on file  ? ? ?Review of Systems: ?See HPI, otherwise negative ROS ? ?Physical Exam: ?BP 119/71   Pulse 84   Temp (!) 96.8 ?F (36 ?C)   Resp 16   Ht 5\' 5"  (1.651 m)   Wt 90.7 kg   SpO2 99%   BMI 33.28 kg/m?  ?General:   Alert,  pleasant and cooperative in NAD ?Head:  Normocephalic and atraumatic. ?Neck:  Supple; no masses or thyromegaly. ?Lungs:  Clear throughout to auscultation.    ?Heart:  Regular rate and rhythm. ?Abdomen:  Soft, nontender and nondistended. Normal bowel sounds, without guarding, and without rebound.   ?Neurologic:  Alert and  oriented x4;  grossly normal  neurologically. ? ?Impression/Plan: ?[name] is here for an colonoscopy to be performed for occult positive stool ? ?Risks, benefits, limitations, and alternatives regarding  colonoscopy have been reviewed with the patient.  Questions have been answered.  All parties agreeable. ? ? ?Sherri Sear, MD  01/07/2022, 8:20 AM ?

## 2022-01-07 NOTE — Anesthesia Preprocedure Evaluation (Addendum)
Anesthesia Evaluation  ?Patient identified by MRN, date of birth, ID band ?Patient awake ? ? ? ?Reviewed: ?Allergy & Precautions, NPO status , Patient's Chart, lab work & pertinent test results ? ?History of Anesthesia Complications ?Negative for: history of anesthetic complications ? ?Airway ?Mallampati: IV ? ? ?Neck ROM: Full ? ? ? Dental ?no notable dental hx. ? ?  ?Pulmonary ?former smoker (quit 2013),  ?  ?Pulmonary exam normal ?breath sounds clear to auscultation ? ? ? ? ? ? Cardiovascular ?Exercise Tolerance: Good ?negative cardio ROS ?Normal cardiovascular exam ?Rhythm:Regular Rate:Normal ? ?ECG 11/22/21:  ?Normal sinus rhythm ?Possible Anterior infarct , age undetermined ?  ?Neuro/Psych ? Headaches, PSYCHIATRIC DISORDERS Anxiety Depression Bipolar Disorder   ? GI/Hepatic ?GERD  ,  ?Endo/Other  ?Obesity  ? Renal/GU ?negative Renal ROS  ? ?  ?Musculoskeletal ? ? Abdominal ?  ?Peds ? Hematology ?negative hematology ROS ?(+)   ?Anesthesia Other Findings ? ? Reproductive/Obstetrics ? ?  ? ? ? ? ? ? ? ? ? ? ? ? ? ?  ?  ? ? ? ? ? ? ? ?Anesthesia Physical ?Anesthesia Plan ? ?ASA: 2 ? ?Anesthesia Plan: General  ? ?Post-op Pain Management:   ? ?Induction: Intravenous ? ?PONV Risk Score and Plan: 3 and Propofol infusion, TIVA and Treatment may vary due to age or medical condition ? ?Airway Management Planned: Natural Airway ? ?Additional Equipment:  ? ?Intra-op Plan:  ? ?Post-operative Plan:  ? ?Informed Consent: I have reviewed the patients History and Physical, chart, labs and discussed the procedure including the risks, benefits and alternatives for the proposed anesthesia with the patient or authorized representative who has indicated his/her understanding and acceptance.  ? ? ? ? ? ?Plan Discussed with: CRNA ? ?Anesthesia Plan Comments: (LMA/GETA backup discussed.  Patient consented for risks of anesthesia including but not limited to:  ?- adverse reactions to medications ?- damage  to eyes, teeth, lips or other oral mucosa ?- nerve damage due to positioning  ?- sore throat or hoarseness ?- damage to heart, brain, nerves, lungs, other parts of body or loss of life ? ?Informed patient about role of CRNA in peri- and intra-operative care.  Patient voiced understanding.)  ? ? ? ? ? ? ?Anesthesia Quick Evaluation ? ?

## 2022-01-07 NOTE — Anesthesia Postprocedure Evaluation (Signed)
Anesthesia Post Note ? ?Patient: Andrea Roberson ? ?Procedure(s) Performed: COLONOSCOPY WITH PROPOFOL ? ?Patient location during evaluation: PACU ?Anesthesia Type: General ?Level of consciousness: awake and alert, oriented and patient cooperative ?Pain management: pain level controlled ?Vital Signs Assessment: post-procedure vital signs reviewed and stable ?Respiratory status: spontaneous breathing, nonlabored ventilation and respiratory function stable ?Cardiovascular status: blood pressure returned to baseline and stable ?Postop Assessment: adequate PO intake ?Anesthetic complications: no ? ? ?No notable events documented. ? ? ?Last Vitals:  ?Vitals:  ? 01/07/22 0933 01/07/22 0943  ?BP: 113/69 121/74  ?Pulse: 77 76  ?Resp: 15 10  ?Temp:    ?SpO2: 100% 100%  ?  ?Last Pain:  ?Vitals:  ? 01/07/22 0943  ?TempSrc:   ?PainSc: 0-No pain  ? ? ?  ?  ?  ?  ?  ?  ? ?Reed Breech ? ? ? ? ?

## 2022-01-10 ENCOUNTER — Encounter: Payer: Self-pay | Admitting: Gastroenterology

## 2022-01-10 ENCOUNTER — Encounter: Payer: Self-pay | Admitting: Family

## 2022-01-10 LAB — SURGICAL PATHOLOGY

## 2022-01-12 NOTE — Telephone Encounter (Signed)
Made appointment for tomorrow  

## 2022-01-13 ENCOUNTER — Encounter: Payer: Self-pay | Admitting: Gastroenterology

## 2022-01-13 ENCOUNTER — Telehealth (INDEPENDENT_AMBULATORY_CARE_PROVIDER_SITE_OTHER): Payer: BC Managed Care – PPO | Admitting: Gastroenterology

## 2022-01-13 DIAGNOSIS — K5904 Chronic idiopathic constipation: Secondary | ICD-10-CM

## 2022-01-13 NOTE — Progress Notes (Signed)
?  ?Lannette Donath, MD ?[address]  ?[address]  ?[place], [place] [address]  ?Main: [phone]  ?Fax: [phone] ? ? ? ?Gastroenterology Consultation Video Visit ? ?Referring Provider:     Allegra Grana, FNP ?Primary Care Physician:  Allegra Grana, FNP ?Primary Gastroenterologist:  Dr. Arlyss Repress ?Reason for Consultation:     Colonoscopy results ?      ? HPI:   ?Andrea Roberson is a 36 y.o. female referred by Dr. Allegra Grana, FNP  for consultation & management of recent colonoscopy results ? ?Virtual Visit Video Note ? ?I connected with Andrea Roberson on 01/13/22 at  3:00 PM EDT by video and verified that I am speaking with the correct person using two identifiers. ?  ?I discussed the limitations, risks, security and privacy concerns of performing an evaluation and management service by video and the availability of in person appointments. I also discussed with the patient that there may be a patient responsible charge related to this service. The patient expressed understanding and agreed to proceed. ? ?Location of the Patient: Work/school ? ?Location of the provider: Office ? ?Persons participating in the visit: Patient and provider only ? ? ?History of Present Illness: ? ?Andrea Roberson is a 36 year old pleasant female with chronic history of constipation associated with abdominal pain and bloating.  Patient recently underwent colonoscopy for fecal occult positive blood, found to have aphthous ulcers in the terminal ileum, pathology was unremarkable other than lymphoid aggregates.  Colon was normal.  Patient reports intermittent episodes of diarrhea but constipation is her main issue.  She reports having fairly frequent irregular bowel habits, about once a week.  She was taking MiraLAX in the past and also tried Linzess 72 mcg daily which resulted in severe diarrhea.  She is currently going through phase of constipation.  Patient would like to know the clinical significance of  aphthous ulcers contributing to her GI symptoms.  Patient has been consuming fruit juice tonics hoping to help with constipation. ? ?NSAIDs: None ? ?Antiplts/Anticoagulants/Anti thrombotics: None ? ?GI Procedures: Colonoscopy for FIT positive 01/07/2022 ?- Aphtha in the terminal ileum. Biopsied. ?- The entire examined colon is normal. ?- Non-bleeding external hemorrhoids. ?DIAGNOSIS:  ?A.  TERMINAL ILEUM; COLD BIOPSY:  ?- SMALL INTESTINAL MUCOSA WITH FOCAL PROMINENT LYMPHOID AGGREGATE AND  ?MILD OVERLYING ACUTE ENTERITIS CONSISTENT WITH APHTHOID LESION.  ?- NEGATIVE FOR VIRAL CYTOPATHIC EFFECT, EVIDENCE OF CHRONICITY,  ?DYSPLASIA, AND MALIGNANCY.  ? ?Past Medical History:  ?Diagnosis Date  ? Anxiety   ? Asthma   ? Bipolar disorder (HCC)   ? Depression   ? Frequent headaches   ? GERD (gastroesophageal reflux disease)   ? Gestational diabetes   ? History of gestational diabetes 2016  ? Hyperlipidemia   ? Migraines   ? Obesity (BMI 35.0-39.9 without comorbidity) 12/24/2015  ? Tarsal coalition of left foot   ? TMJ (dislocation of temporomandibular joint)   ? UTI (urinary tract infection)   ? Wears contact lenses   ? ? ?Past Surgical History:  ?Procedure Laterality Date  ? CESAREAN SECTION  2015  ? CESAREAN SECTION WITH BILATERAL TUBAL LIGATION N/A 11/19/2018  ? Procedure: CESAREAN SECTION WITH BILATERAL TUBAL LIGATION;  Surgeon: Hildred Laser, MD;  Location: [facility] ORS;  Service: Obstetrics;  Laterality: N/A;  TOB 1497 ?APGAR 8/8 ? ?  ? COLONOSCOPY WITH PROPOFOL N/A 01/07/2022  ? Procedure: COLONOSCOPY WITH PROPOFOL;  Surgeon: Toney Reil, MD;  Location: [facility] END[facility]COPY;  Service:  Gastroenterology;  Laterality: N/A;  ? ESOPHAGOGASTRODUODENOSCOPY (EGD) WITH PROPOFOL N/A 08/30/2017  ? Procedure: ESOPHAGOGASTRODUODENOSCOPY (EGD) WITH PROPOFOL;  Surgeon: Toney Reil, MD;  Location: [facility];  Service: Endoscopy;  Laterality: N/A;  ? TUBAL LIGATION    ? WISDOM TOOTH EXTRACTION    ? ? ?Current Outpatient  Medications:  ?  busPIRone (BUSPAR) 30 MG tablet, Take 1 tablet (30 mg total) by mouth daily., Disp: 90 tablet, Rfl: 1 ?  cetirizine (ZYRTEC) 10 MG tablet, Take 10 mg by mouth daily as needed for allergies. , Disp: , Rfl:  ?  FLUoxetine (PROZAC) 40 MG capsule, Take 1 capsule (40 mg total) by mouth daily., Disp: 90 capsule, Rfl: 1 ?  fluticasone (FLONASE) 50 MCG/ACT nasal spray, Place 2 sprays into both nostrils daily as needed for allergies. , Disp: , Rfl:  ?  hydrOXYzine (ATARAX/VISTARIL) 25 MG tablet, Take 0.5-1 tablets (12.5-25 mg total) by mouth 2 (two) times daily as needed. For anxiety, sleep, Disp: 60 tablet, Rfl: 1 ?  meloxicam (MOBIC) 15 MG tablet, Take 1 tablet (15 mg total) by mouth daily., Disp: 30 tablet, Rfl: 3 ?  metFORMIN (GLUCOPHAGE) 500 MG tablet, TAKE 1 TABLET BY MOUTH TWICE DAILY, Disp: 180 tablet, Rfl: 3 ?  minoxidil (ROGAINE WOMENS) 2 % external solution, Apply topically 2 (two) times daily., Disp: , Rfl:  ?  mometasone (ELOCON) 0.1 % lotion, SMARTSIG:4 Drop(s) In Ear(s) Every Night PRN, Disp: , Rfl:  ?  pantoprazole (PROTONIX) 20 MG tablet, Take 1 tablet (20 mg total) by mouth daily., Disp: 90 tablet, Rfl: 1 ?  propranolol ER (INDERAL LA) 60 MG 24 hr capsule, Take 1 capsule (60 mg total) by mouth daily., Disp: 90 capsule, Rfl: 3 ?  scopolamine (TRANSDERM-SCOP) 1 MG/3DAYS, Place 1 patch (1.5 mg total) onto the skin every 3 (three) days., Disp: 10 patch, Rfl: 0 ?  SUMAtriptan (IMITREX) 100 MG tablet, Take 1 tablet earliest onset of migraine.  May repeat in 2 hours if headache persists or recurs.  Maximum 2 tablets in 24 hours., Disp: 10 tablet, Rfl: 5 ?  tirzepatide (MOUNJARO) 2.5 MG/0.5ML Pen, Inject 2.5 mg into the skin once a week., Disp: 2 mL, Rfl: 2 ?  topiramate (TOPAMAX) 25 MG tablet, Take 1 tablet (25 mg total) by mouth 2 (two) times daily., Disp: 120 tablet, Rfl: 2 ?  UNABLE TO FIND, Viviscal Pro for hair loss., Disp: , Rfl:  ? ?Current Facility-Administered Medications:  ?   betamethasone acetate-betamethasone sodium phosphate (CELESTONE) injection 3 mg, 3 mg, Intra-articular, Once, Felecia Shelling, DPM ? ? ? ?Family History  ?Problem Relation Age of Onset  ? Heart disease Mother   ? Diabetes Mother   ? Anxiety disorder Mother   ? Arthritis Mother   ? Skin cancer Mother   ? Diabetes Father   ? Skin cancer Father   ? Arthritis Father   ? Colon polyps Father   ? Peptic Ulcer Disease Father   ? Colon polyps Brother   ? Heart disease Maternal Grandmother   ? Colon cancer Paternal Grandfather   ? Colon cancer Paternal Aunt   ? Thyroid cancer Neg Hx   ?  ? ?Social History  ? ?Tobacco Use  ? Smoking status: Former  ?  Packs/day: 1.50  ?  Years: 8.00  ?  Pack years: 12.00  ?  Types: Cigarettes  ?  Quit date: 09/16/2012  ?  Years since quitting: 9.3  ? Smokeless tobacco: Never  ?Vaping Use  ?  Vaping Use: Former  ?Substance Use Topics  ? Alcohol use: Not Currently  ?  Comment: 2 glasswine/month  ? Drug use: No  ? ? ?Allergies as of 01/13/2022 - Review Complete 01/07/2022  ?Allergen Reaction Noted  ? Vyleesi [bremelanotide] Itching, Nausea And Vomiting, and Other (See Comments) 05/26/2020  ? ? ?Imaging Studies: ?Reviewed ? ?Assessment and Plan:  ? ?Andrea Roberson is a 36 y.o. female with history of chronic abdominal pain, abdominal bloating and constipation ? ?Chronic constipation resulting in pain and bloating ?Reiterated on high-fiber diet, information provided ?Discussed about adequate intake of water ?Advised patient to stop consuming fruit tonics ?Patient did not tolerate low-dose Linzess in the past ?Start MiraLAX 34 g once daily ?If she is still having symptoms despite regulating constipation, discussed about video capsule endoscopy to evaluate for rest of the small bowel.   ? ?Aphthous ulcers in the terminal ileum: Likely nonspecific/incidental finding ?There is no evidence of definite Crohn's based on the pathology results ?Patient denies NSAID use, BC powder Goody powder, likely  nonspecific and clinically insignificant finding ?Can consider to perform video capsule endoscopy if her symptoms are persistent after managing constipation ?Patient expressed understanding of the plan ? ? ?Follow Up In

## 2022-01-26 ENCOUNTER — Encounter: Payer: Self-pay | Admitting: Family

## 2022-01-26 ENCOUNTER — Ambulatory Visit (INDEPENDENT_AMBULATORY_CARE_PROVIDER_SITE_OTHER): Payer: BC Managed Care – PPO | Admitting: Family

## 2022-01-26 VITALS — BP 118/82 | HR 72 | Temp 97.0°F | Ht 65.0 in | Wt 199.6 lb

## 2022-01-26 DIAGNOSIS — R2230 Localized swelling, mass and lump, unspecified upper limb: Secondary | ICD-10-CM | POA: Insufficient documentation

## 2022-01-26 DIAGNOSIS — R2231 Localized swelling, mass and lump, right upper limb: Secondary | ICD-10-CM

## 2022-01-26 DIAGNOSIS — R1013 Epigastric pain: Secondary | ICD-10-CM | POA: Diagnosis not present

## 2022-01-26 DIAGNOSIS — Z87898 Personal history of other specified conditions: Secondary | ICD-10-CM | POA: Diagnosis not present

## 2022-01-26 DIAGNOSIS — E669 Obesity, unspecified: Secondary | ICD-10-CM

## 2022-01-26 LAB — POCT GLYCOSYLATED HEMOGLOBIN (HGB A1C): Hemoglobin A1C: 5.1 % (ref 4.0–5.6)

## 2022-01-26 MED ORDER — TIRZEPATIDE 5 MG/0.5ML ~~LOC~~ SOAJ: 5.0000 mg | SUBCUTANEOUS | 2 refills | Status: DC

## 2022-01-26 NOTE — Assessment & Plan Note (Signed)
Resolved at this time.  Advised to trial weaning off Protonix due to long-term side effects.  Advised to use Protonix for flares.  She verbalized understanding ?

## 2022-01-26 NOTE — Progress Notes (Signed)
? ?Subjective:  ? ? Patient ID: Andrea Roberson, female    DOB: [birth year], 36 y.o.   MRN: [id number] ? ?CC: Andrea Roberson is a 36 y.o. female who presents today for follow up.  ? ?HPI: Complains of right axillar mass that she noticed 2-4 weeks ago while in the shower ?Nontender ?Area not warm, draining or changed in size ?No recent illness, vaccine.  ? ?No personal history or family history of breast cancer ?Compliant with Mounjaro 36m . She had slight nausea when increased from 2.5 to 576mhowever previously this had improved in time.  ?  ?Constipation resolved with miralax.  ? ?She is compliant with protonix . Epigastric pain has resolved.  ?EGD 2018 negative gastritis ? ?recent consult with Dr. VaMarius D[facility]gastroenterology 01/13/2022 for chronic idiopathic constipation.  Started on MiraLAX 34 g daily.  May consider video capsule endoscopy ?HISTORY:  ?Past Medical History:  ?Diagnosis Date  ?? Anxiety   ?? Asthma   ?? Bipolar disorder (HCM[place]  ?? Depression   ?? Frequent headaches   ?? GERD (gastroesophageal reflux disease)   ?? Gestational diabetes   ?? History of gestational diabetes 2016  ?? Hyperlipidemia   ?? Migraines   ?? Obesity (BMI 35.0-39.9 without comorbidity) 12/24/2015  ?? Tarsal coalition of left foot   ?? TMJ (dislocation of temporomandibular joint)   ?? UTI (urinary tract infection)   ?? Wears contact lenses   ? ?Past Surgical History:  ?Procedure Laterality Date  ?? CESAREAN SECTION  2015  ?? CESAREAN SECTION WITH BILATERAL TUBAL LIGATION N/A 11/19/2018  ? Procedure: CESAREAN SECTION WITH BILATERAL TUBAL LIGATION;  Surgeon: ChRubie MaidMD;  Location: [facility] ORS;  Service: Obstetrics;  Laterality: N/A;  TOB [id number] 8/8 ? ?  ?? COLONOSCOPY WITH PROPOFOL N/A 01/07/2022  ? Procedure: COLONOSCOPY WITH PROPOFOL;  Surgeon: VaLin LandsmanMD;  [facility];  Service: Gastroenterology;  Laterality: N/A;  ?? ESOPHAGOGASTRODUODENOSCOPY (EGD) WITH PROPOFOL N/A 08/30/2017  ? Procedure:  ESOPHAGOGASTRODUODENOSCOPY (EGD) WITH PROPOFOL;  Surgeon: VaLin LandsmanMD;  [facility] [facility];  Laterality: N/A;  ?? TUBAL LIGATION    ?? WISDOM TOOTH EXTRACTION    ? ?Family History  ?Problem Relation Age of Onset  ?? Heart disease Mother   ?? Diabetes Mother   ?? Anxiety disorder Mother   ?? Arthritis Mother   ?? Skin cancer Mother   ?? Diabetes Father   ?? Skin cancer Father   ?? Arthritis Father   ?? Colon polyps Father   ?? Peptic Ulcer Disease Father   ?? Colon polyps Brother   ?? Heart disease Maternal Grandmother   ?? Colon cancer Paternal Grandfather   ?? Colon cancer Paternal Aunt   ?? Thyroid cancer Neg Hx   ?? Breast cancer Neg Hx   ? ? ?Allergies: Vyleesi [bremelanotide] ?Current Outpatient Medications on File Prior to Visit  ?Medication Sig Dispense Refill  ?? busPIRone (BUSPAR) 30 MG tablet Take 1 tablet (30 mg total) by mouth daily. 90 tablet 1  ?? cetirizine (ZYRTEC) 10 MG tablet Take 10 mg by mouth daily as needed for allergies.     ?? FLUoxetine (PROZAC) 40 MG capsule Take 1 capsule (40 mg total) by mouth daily. 90 capsule 1  ?? fluticasone (FLONASE) 50 MCG/ACT nasal spray Place 2 sprays into both nostrils daily as needed for allergies.     ?? hydrOXYzine (ATARAX/VISTARIL) 25 MG tablet Take 0.5-1 tablets (12.5-25 mg total) by mouth 2 (  two) times daily as needed. For anxiety, sleep 60 tablet 1  ?? meloxicam (MOBIC) 15 MG tablet Take 1 tablet (15 mg total) by mouth daily. 30 tablet 3  ?? metFORMIN (GLUCOPHAGE) 500 MG tablet TAKE 1 TABLET BY MOUTH TWICE DAILY 180 tablet 3  ?? minoxidil (ROGAINE WOMENS) 2 % external solution Apply topically 2 (two) times daily.    ?? mometasone (ELOCON) 0.1 % lotion SMARTSIG:4 Drop(s) In Ear(s) Every Night PRN    ?? pantoprazole (PROTONIX) 20 MG tablet Take 1 tablet (20 mg total) by mouth daily. 90 tablet 1  ?? propranolol ER (INDERAL LA) 60 MG 24 hr capsule Take 1 capsule (60 mg total) by mouth daily. 90 capsule 3  ??  scopolamine (TRANSDERM-SCOP) 1 MG/3DAYS Place 1 patch (1.5 mg total) onto the skin every 3 (three) days. 10 patch 0  ?? SUMAtriptan (IMITREX) 100 MG tablet Take 1 tablet earliest onset of migraine.  May repeat in 2 hours if headache persists or recurs.  Maximum 2 tablets in 24 hours. 10 tablet 5  ?? topiramate (TOPAMAX) 25 MG tablet Take 1 tablet (25 mg total) by mouth 2 (two) times daily. 120 tablet 2  ?? BINAXNOW COVID-19 AG HOME TEST KIT TEST AS DIRECTED TODAY    ?? UNABLE TO FIND Viviscal Pro for hair loss. (Patient not taking: Reported on 01/26/2022)    ? ?Current Facility-Administered Medications on File Prior to Visit  ?Medication Dose Route Frequency Provider Last Rate Last Admin  ?? betamethasone acetate-betamethasone sodium phosphate (CELESTONE) injection 3 mg  3 mg Intra-articular Once Edrick Kins, DPM      ? ? ?Social History  ? ?Tobacco Use  ?? Smoking status: Former  ?  Packs/day: 1.50  ?  Years: 8.00  ?  Pack years: 12.00  ?  Types: Cigarettes  ?  Quit date: 09/16/2012  ?  Years since quitting: 9.3  ?? Smokeless tobacco: Never  ?Vaping Use  ?? Vaping Use: Former  ?Substance Use Topics  ?? Alcohol use: Not Currently  ?  Comment: 2 glasswine/month  ?? Drug use: No  ? ? ?Review of Systems  ?Constitutional:  Negative for chills and fever.  ?Respiratory:  Negative for cough.   ?Cardiovascular:  Negative for chest pain and palpitations.  ?Gastrointestinal:  Negative for constipation (resolved), nausea and vomiting.  ?   ?Objective:  ?  ?BP 118/82 (BP Location: Left Arm, Patient Position: Sitting, Cuff Size: Normal)   Pulse 72   Temp (!) 97 ?F (36.1 ?C) (Temporal)   Ht 5' 5" (1.651 m)   Wt 199 lb 9.6 oz (90.5 kg)   LMP 01/06/2022 (Approximate)   SpO2 97%   BMI 33.22 kg/m?  ?BP Readings from Last 3 Encounters:  ?01/26/22 118/82  ?01/07/22 121/74  ?11/22/21 123/70  ? ?Wt Readings from Last 3 Encounters:  ?01/26/22 199 lb 9.6 oz (90.5 kg)  ?01/07/22 200 lb (90.7 kg)  ?11/23/21 204 lb (92.5 kg)   ? ? ?Physical Exam ?Vitals reviewed.  ?Constitutional:   ?   Appearance: She is well-developed.  ?Eyes:  ?   Conjunctiva/sclera: Conjunctivae normal.  ?Cardiovascular:  ?   Rate and Rhythm: Normal rate and regular rhythm.  ?   Pulses: Normal pulses.  ?   Heart sounds: Normal heart sounds.  ?Pulmonary:  ?   Effort: Pulmonary effort is normal.  ?   Breath sounds: Normal breath sounds. No wheezing, rhonchi or rales.  ?Chest:  ? ? ?   Comments: Density appreciated  right axillary area.  Poorly circumscribed . nontender.  No increased heat or erythema.  Area is nonfluctuant ?Density appreciated left axillary area without discrete asymmetry or well-circumscribed mass ?Skin: ?   General: Skin is warm and dry.  ?Neurological:  ?   Mental Status: She is alert.  ?Psychiatric:     ?   Speech: Speech normal.     ?   Behavior: Behavior normal.     ?   Thought Content: Thought content normal.  ? ? ?   ?Assessment & Plan:  ? ?Problem List Items Addressed This Visit   ? ?  ? Other  ? Dyspepsia  ?  Resolved at this time.  Advised to trial weaning off Protonix due to long-term side effects.  Advised to use Protonix for flares.  She verbalized understanding ?  ?  ? Mass in armpit  ?  Able to appreciate asymmetry under right axillary area when compared to left.  Presentation not consistent with abscess.  Question whether density of breast over muscle. Pending right axillary ultrasound and baseline mammogram.  ?  ?  ? Relevant Orders  ? MM 3D SCREEN BREAST BILATERAL  ? US BREAST LTD UNI RIGHT INC AXILLA  ? Obesity (BMI 35.0-39.9 without comorbidity)  ?  Patient overall tolerating Mounjaro.  She would like to stay on 5 mg.  I have refilled this dose for her.  She will let me know of any concern for side effects ?  ?  ? Relevant Medications  ? tirzepatide [facility]) 5 MG/0.5ML Pen  ? ?Other Visit Diagnoses   ? ? History of prediabetes    -  Primary  ? Relevant Medications  ? tirzepatide [facility]) 5 MG/0.5ML Pen  ? Other Relevant Orders   ? POCT HgB A1C (Completed)  ? ?  ? ? ? ?I have discontinued Sashay M. Clingenpeel's tirzepatide. I am also having her start on tirzepatide. Additionally, I am having her maintain her fluticasone, cetirizine, hydrOXYzine, mometasone,

## 2022-01-26 NOTE — Assessment & Plan Note (Addendum)
Able to appreciate asymmetry under right axillary area when compared to left.  Presentation not consistent with abscess.  Question whether density of breast over muscle. Pending right axillary ultrasound and baseline mammogram.  ?

## 2022-01-26 NOTE — Assessment & Plan Note (Addendum)
Lab Results  ?Component Value Date  ? HGBA1C 5.1 01/26/2022  ? ?Patient overall tolerating Mounjaro.  She would like to stay on 5 mg.  I have refilled this dose for her.  She will let me know of any concern for side effects ?

## 2022-01-26 NOTE — Patient Instructions (Addendum)
Trial wean off of protonix.  ? ?Long term use beyond 3 months of proton pump inhibitors ( PPI's) include Nexium, Prilosec, Protonix, Dexilant, and Prevacid. ? ?Some medical studies have identified an association between the long-term use of PPIs and the development of numerous adverse conditions including intestinal infections, pneumonia, stomach cancer, osteoporosis-related bone fractures, chronic kidney disease, deficiencies of certain vitamins and minerals, heart attacks, strokes, dementia, and early death. Those studies have flaws, are not considered definitive, and do not establish a cause-and-effect relationship between PPIs and the adverse conditions. High-quality studies have found that PPIs do not significantly increase the risk of any of these conditions except intestinal infections. Nevertheless, we cannot exclude the possibility that PPIs might confer a small increase in the risk of developing these adverse conditions. For the treatment of GERD, gastroenterologists generally agree that the well-established benefits of PPIs far outweigh their theoretical risks. ? ?Nonetheless I recommend trial wean off of PPI's  if uncomplicated acid reflux and use of histamine 2 blocker ( Pepcid AC).  ? ?Patients with history of esophagitis  or Barrett's esophagus should remain on PPI.  ? ?I generally recommend trying to control acid reflux with lifestyle modifications including avoiding trigger foods, not eating 2 hours prior to bedtime. You may use histamine 2 blockers daily to twice daily ( this is Pepcid) and then when symptoms flare, start back on PPI for short course.  ? ?Of note, we will need to do an endoscopy ( upper GI) to evaluate your esophagus, stomach in the future if acid reflux persists are you develop red flag symptoms: trouble swallowing, hoarseness, chronic cough, unexplained weight loss.  ? ?

## 2022-01-28 ENCOUNTER — Other Ambulatory Visit: Payer: Self-pay

## 2022-01-28 ENCOUNTER — Other Ambulatory Visit: Payer: Self-pay | Admitting: Family

## 2022-01-28 ENCOUNTER — Other Ambulatory Visit: Payer: Self-pay | Admitting: Adult Health Nurse Practitioner

## 2022-01-28 DIAGNOSIS — Z1231 Encounter for screening mammogram for malignant neoplasm of breast: Secondary | ICD-10-CM

## 2022-01-28 DIAGNOSIS — R2231 Localized swelling, mass and lump, right upper limb: Secondary | ICD-10-CM

## 2022-02-09 ENCOUNTER — Other Ambulatory Visit: Payer: Self-pay | Admitting: Psychiatry

## 2022-02-09 DIAGNOSIS — F411 Generalized anxiety disorder: Secondary | ICD-10-CM

## 2022-02-11 ENCOUNTER — Telehealth: Payer: Self-pay

## 2022-02-11 DIAGNOSIS — E669 Obesity, unspecified: Secondary | ICD-10-CM

## 2022-02-11 NOTE — Telephone Encounter (Signed)
Spoke to patient about them denying the [facility], and she stated that her insurance said they would maybe cover Ozempic, or Wegovy. She would be willing to try either of them ?

## 2022-02-15 ENCOUNTER — Encounter: Payer: Self-pay | Admitting: Family

## 2022-02-15 MED ORDER — WEGOVY 0.25 MG/0.5ML ~~LOC~~ SOAJ: 0.2500 mg | SUBCUTANEOUS | 2 refills | Status: DC

## 2022-02-15 NOTE — Telephone Encounter (Signed)
Sent wegovy

## 2022-02-18 ENCOUNTER — Telehealth: Payer: Self-pay

## 2022-02-18 NOTE — Telephone Encounter (Signed)
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 ?

## 2022-02-20 ENCOUNTER — Ambulatory Visit
Admission: EM | Admit: 2022-02-20 | Discharge: 2022-02-20 | Disposition: A | Discharge status: Home / Self Care | Payer: BC Managed Care – PPO | Attending: Emergency Medicine | Admitting: Emergency Medicine

## 2022-02-20 DIAGNOSIS — J0140 Acute pansinusitis, unspecified: Secondary | ICD-10-CM | POA: Insufficient documentation

## 2022-02-20 LAB — GROUP A STREP BY PCR: Group A Strep by PCR: NOT DETECTED

## 2022-02-20 MED ORDER — IPRATROPIUM BROMIDE 0.06 % NA SOLN: 2.0000 | Freq: Four times a day (QID) | NASAL | 12 refills | Status: AC

## 2022-02-20 MED ORDER — BENZONATATE 100 MG PO CAPS: 200.0000 mg | ORAL_CAPSULE | Freq: Three times a day (TID) | ORAL | 0 refills | Status: DC

## 2022-02-20 MED ORDER — AMOXICILLIN-POT CLAVULANATE 875-125 MG PO TABS: 1.0000 | ORAL_TABLET | Freq: Two times a day (BID) | ORAL | 0 refills | Status: AC

## 2022-02-20 MED ORDER — PROMETHAZINE-DM 6.25-15 MG/5ML PO SYRP: 5.0000 mL | ORAL_SOLUTION | Freq: Four times a day (QID) | ORAL | 0 refills | Status: DC | PRN

## 2022-02-20 NOTE — Discharge Instructions (Signed)
The Augmentin twice daily with food for 10 days for treatment of your sinusitis.  Perform sinus irrigation 2-3 times a day with a [employer] sinus rinse kit and distilled water.  Do not use tap water.  You can use plain over-the-counter Mucinex every 6 hours to break up the stickiness of the mucus so your body can clear it.  Increase your oral fluid intake to thin out your mucus so that is also able for your body to clear more easily.  Take an over-the-counter probiotic, such as Culturelle-align-activia, 1 hour after each dose of antibiotic to prevent diarrhea.  Use the Atrovent nasal spray, 2 squirts in each nostril every 6 hours, as needed for runny nose and postnasal drip.  Use the Tessalon Perles every 8 hours during the day.  Take them with a small sip of water.  They may give you some numbness to the base of your tongue or a metallic taste in your mouth, this is normal.  Use the Promethazine DM cough syrup at bedtime for cough and congestion.  It will make you drowsy so do not take it during the day.  If you develop any new or worsening symptoms return for reevaluation or see your primary care provider.  

## 2022-02-20 NOTE — ED Provider Notes (Signed)
?[facility] ? ? ? ?CSN: [id number] ?Arrival date & time: 02/20/22  1128 ? ? ?  ? ?History   ?Chief Complaint ?Chief Complaint  ?Patient presents with  ? Sore Throat  ?  Loss of voice  ? Eye Problem  ? Cough  ? Headache  ? ? ?HPI ?Andrea Roberson is a 36 y.o. female.  ? ?HPI ? ?72 old female here for evaluation of respiratory complaints. ? ?Patient reports that she has been experiencing runny nose with green nasal discharge, a cough that is both productive and nonproductive for green sputum for the last 9 days.  She started develop hoarseness and lose her voice 4 days ago.  This morning she woke up and her right eye was matted shut, red, and she is complaining of a burning itchiness.  Patient does endorse frontal sinus headache, ear pain, and mild shortness of breath going on for the past 9 days as well but she denies fever or wheezing. ? ?Past Medical History:  ?Diagnosis Date  ? Anxiety   ? Asthma   ? Bipolar disorder (HCC)   ? Depression   ? Frequent headaches   ? GERD (gastroesophageal reflux disease)   ? Gestational diabetes   ? History of gestational diabetes 2016  ? Hyperlipidemia   ? Migraines   ? Obesity (BMI 35.0-39.9 without comorbidity) 12/24/2015  ? Tarsal coalition of left foot   ? TMJ (dislocation of temporomandibular joint)   ? UTI (urinary tract infection)   ? Wears contact lenses   ? ? ?Patient Active Problem List  ? Diagnosis Date Noted  ? Mass in armpit 01/26/2022  ? Occult blood positive stool   ? Aphthous ulcer of ileum   ? Nausea 11/24/2021  ? Change in stool 11/23/2021  ? MDD (major depressive disorder), recurrent, in full remission (HCC) 10/06/2020  ? TMJ (dislocation of temporomandibular joint)   ? Migraines   ? MDD (major depressive disorder), recurrent, in partial remission (HCC) 01/28/2020  ? MDD (major depressive disorder), recurrent episode, mild (HCC) 06/19/2019  ? Bereavement 05/15/2019  ? Insomnia due to medical condition 05/15/2019  ? Elevated cholesterol 04/24/2019  ?  Dyspepsia   ? Constipation 05/12/2017  ? Generalized anxiety disorder 01/27/2016  ? Obesity (BMI 35.0-39.9 without comorbidity) 12/24/2015  ? Anxiety and depression 12/08/2015  ? ? ?Past Surgical History:  ?Procedure Laterality Date  ? CESAREAN SECTION  2015  ? CESAREAN SECTION WITH BILATERAL TUBAL LIGATION N/A 11/19/2018  ? Procedure: CESAREAN SECTION WITH BILATERAL TUBAL LIGATION;  Surgeon: Hildred Laser, MD;  Location: [facility] ORS;  Service: Obstetrics;  Laterality: N/A;  TOB 9147 ?APGAR 8/8 ? ?  ? COLONOSCOPY WITH PROPOFOL N/A 01/07/2022  ? Procedure: COLONOSCOPY WITH PROPOFOL;  Surgeon: Toney Reil, MD;  Location: [facility] ENDOSCOPY;  Service: Gastroenterology;  Laterality: N/A;  ? ESOPHAGOGASTRODUODENOSCOPY (EGD) WITH PROPOFOL N/A 08/30/2017  ? Procedure: ESOPHAGOGASTRODUODENOSCOPY (EGD) WITH PROPOFOL;  Surgeon: Toney Reil, MD;  Location: [facility];  Service: Endoscopy;  Laterality: N/A;  ? TUBAL LIGATION    ? WISDOM TOOTH EXTRACTION    ? ? ?OB History   ? ? Gravida  ?2  ? Para  ?2  ? Term  ?2  ? Preterm  ?   ? AB  ?   ? Living  ?2  ?  ? ? SAB  ?   ? IAB  ?   ? Ectopic  ?   ? Multiple  ?0  ? Live Births  ?2  ?   ?  ?  ? ? ? ?  Home Medications   ? ?Prior to Admission medications   ?Medication Sig Start Date End Date Taking? Authorizing Provider  ?amoxicillin-clavulanate (AUGMENTIN) 875-125 MG tablet Take 1 tablet by mouth every 12 (twelve) hours for 10 days. 02/20/22 03/02/22 Yes Becky Augusta, NP  ?benzonatate (TESSALON) 100 MG capsule Take 2 capsules (200 mg total) by mouth every 8 (eight) hours. 02/20/22  Yes Becky Augusta, NP  ?busPIRone (BUSPAR) 30 MG tablet Take 1 tablet (30 mg total) by mouth daily. 05/18/21  Yes Jomarie Longs, MD  ?cetirizine (ZYRTEC) 10 MG tablet Take 10 mg by mouth daily as needed for allergies.    Yes [provider]  ?FLUoxetine (PROZAC) 40 MG capsule TAKE 1 CAPSULE(40 MG) BY MOUTH DAILY 02/09/22  Yes Eappen, Levin Bacon, MD  ?fluticasone (FLONASE) 50 MCG/ACT nasal  spray Place 2 sprays into both nostrils daily as needed for allergies.    Yes [provider]  ?hydrOXYzine (ATARAX/VISTARIL) 25 MG tablet Take 0.5-1 tablets (12.5-25 mg total) by mouth 2 (two) times daily as needed. For anxiety, sleep 09/05/19  Yes Eappen, Levin Bacon, MD  ?ipratropium (ATROVENT) 0.06 % nasal spray Place 2 sprays into both nostrils 4 (four) times daily. 02/20/22  Yes Becky Augusta, NP  ?meloxicam (MOBIC) 15 MG tablet Take 1 tablet (15 mg total) by mouth daily. 08/24/21  Yes Felecia Shelling, DPM  ?metFORMIN (GLUCOPHAGE) 500 MG tablet TAKE 1 TABLET BY MOUTH TWICE DAILY 08/02/21  Yes Felecia Shelling, DPM  ?pantoprazole (PROTONIX) 20 MG tablet Take 1 tablet (20 mg total) by mouth daily. 11/23/21  Yes Allegra Grana, FNP  ?promethazine-dextromethorphan (PROMETHAZINE-DM) 6.25-15 MG/5ML syrup Take 5 mLs by mouth 4 (four) times daily as needed. 02/20/22  Yes Becky Augusta, NP  ?propranolol ER (INDERAL LA) 60 MG 24 hr capsule Take 1 capsule (60 mg total) by mouth daily. 09/13/21  Yes Allegra Grana, FNP  ?Semaglutide-Weight Management (WEGOVY) 0.25 MG/0.5ML SOAJ Inject 0.25 mg into the skin once a week. 02/15/22  Yes Allegra Grana, FNP  ?SUMAtriptan (IMITREX) 100 MG tablet Take 1 tablet earliest onset of migraine.  May repeat in 2 hours if headache persists or recurs.  Maximum 2 tablets in 24 hours. 12/04/20  Yes Everlena Cooper, Adam R, DO  ?topiramate (TOPAMAX) 25 MG tablet Take 1 tablet (25 mg total) by mouth 2 (two) times daily. 10/27/21  Yes Allegra Grana, FNP  ?minoxidil (ROGAINE WOMENS) 2 % external solution Apply topically 2 (two) times daily.    [provider]  ?mometasone (ELOCON) 0.1 % lotion SMARTSIG:4 Drop(s) In Ear(s) Every Night PRN 03/06/20   [provider]  ?scopolamine (TRANSDERM-SCOP) 1 MG/3DAYS Place 1 patch (1.5 mg total) onto the skin every 3 (three) days. 10/22/20   Allegra Grana, FNP  ? ? ?Family History ?Family History  ?Problem Relation Age of Onset  ? Heart  disease Mother   ? Diabetes Mother   ? Anxiety disorder Mother   ? Arthritis Mother   ? Skin cancer Mother   ? Diabetes Father   ? Skin cancer Father   ? Arthritis Father   ? Colon polyps Father   ? Peptic Ulcer Disease Father   ? Colon polyps Brother   ? Heart disease Maternal Grandmother   ? Colon cancer Paternal Grandfather   ? Colon cancer Paternal Aunt   ? Thyroid cancer Neg Hx   ? Breast cancer Neg Hx   ? ? ?Social History ?Social History  ? ?Tobacco Use  ? Smoking status: Former  ?  Packs/day: 1.50  ?  Years: 8.00  ?  Pack years: 12.00  ?  Types: Cigarettes  ?  Quit date: 09/16/2012  ?  Years since quitting: 9.4  ? Smokeless tobacco: Never  ?Vaping Use  ? Vaping Use: Former  ?Substance Use Topics  ? Alcohol use: Not Currently  ?  Comment: 2 glasswine/month  ? Drug use: No  ? ? ? ?Allergies   ?Vyleesi [bremelanotide] ? ? ?Review of Systems ?Review of Systems  ?Constitutional:  Negative for fever.  ?HENT:  Positive for congestion, ear pain, rhinorrhea, sinus pressure and sore throat.   ?Eyes:  Positive for discharge, redness and itching. Negative for visual disturbance.  ?Respiratory:  Positive for cough and shortness of breath. Negative for wheezing.   ?Neurological:  Positive for headaches.  ? ? ?Physical Exam ?Triage Vital Signs ?ED Triage Vitals  ?Enc Vitals Group  ?   BP 02/20/22 1137 125/82  ?   Pulse Rate 02/20/22 1137 61  ?   Resp 02/20/22 1137 18  ?   Temp 02/20/22 1137 98.3 ?F (36.8 ?C)  ?   Temp Source 02/20/22 1137 Oral  ?   SpO2 02/20/22 1137 100 %  ?   Weight 02/20/22 1133 193 lb (87.5 kg)  ?   Height 02/20/22 1133 5\' 5"  (1.651 m)  ?   Head Circumference --   ?   Peak Flow --   ?   Pain Score 02/20/22 1131 3  ?   Pain Loc --   ?   Pain Edu? --   ?   Excl. in GC? --   ? ?No data found. ? ?Updated Vital Signs ?BP 125/82 (BP Location: Left Arm)   Pulse 61   Temp 98.3 ?F (36.8 ?C) (Oral)   Resp 18   Ht 5\' 5"  (1.651 m)   Wt 193 lb (87.5 kg)   LMP 02/07/2022 (Approximate)   SpO2 100%   BMI 32.12  kg/m?  ? ?Visual Acuity ?Right Eye Distance: 20/20 Corrected ?Left Eye Distance: 20/20 Corrected ?Bilateral Distance: 20/20 Corrected ? ?Right Eye Near:   ?Left Eye Near:    ?Bilateral Near:    ? ?Physical Exam ?[facility]

## 2022-02-20 NOTE — ED Triage Notes (Signed)
Patient is here for "loss of voice, cough" for about a week now, with sore throat most recently. No fever known. Body aches "in beginning, none now". Home COVID19 test "Negative". Eye redness started today "but left its itchy too".  ?

## 2022-02-22 ENCOUNTER — Other Ambulatory Visit: Payer: Self-pay | Admitting: Family

## 2022-02-22 ENCOUNTER — Ambulatory Visit
Admission: RE | Admit: 2022-02-22 | Discharge: 2022-02-22 | Disposition: A | Discharge status: Home / Self Care | Payer: BC Managed Care – PPO | Source: Ambulatory Visit | Attending: Family | Admitting: Family

## 2022-02-22 DIAGNOSIS — N6332 Unspecified lump in axillary tail of the left breast: Secondary | ICD-10-CM

## 2022-02-22 DIAGNOSIS — R2231 Localized swelling, mass and lump, right upper limb: Secondary | ICD-10-CM | POA: Diagnosis present

## 2022-02-22 DIAGNOSIS — Z1231 Encounter for screening mammogram for malignant neoplasm of breast: Secondary | ICD-10-CM | POA: Insufficient documentation

## 2022-02-23 ENCOUNTER — Other Ambulatory Visit: Payer: Self-pay | Admitting: Family

## 2022-02-23 DIAGNOSIS — R599 Enlarged lymph nodes, unspecified: Secondary | ICD-10-CM

## 2022-02-23 DIAGNOSIS — R928 Other abnormal and inconclusive findings on diagnostic imaging of breast: Secondary | ICD-10-CM

## 2022-02-23 DIAGNOSIS — N6489 Other specified disorders of breast: Secondary | ICD-10-CM

## 2022-03-03 ENCOUNTER — Ambulatory Visit
Admission: RE | Admit: 2022-03-03 | Discharge: 2022-03-03 | Disposition: A | Discharge status: Home / Self Care | Payer: BC Managed Care – PPO | Source: Ambulatory Visit | Attending: Family | Admitting: Family

## 2022-03-03 DIAGNOSIS — R599 Enlarged lymph nodes, unspecified: Secondary | ICD-10-CM

## 2022-03-03 DIAGNOSIS — R928 Other abnormal and inconclusive findings on diagnostic imaging of breast: Secondary | ICD-10-CM

## 2022-03-03 DIAGNOSIS — N6489 Other specified disorders of breast: Secondary | ICD-10-CM | POA: Insufficient documentation

## 2022-03-03 HISTORY — PX: BREAST BIOPSY: SHX20

## 2022-03-04 LAB — SURGICAL PATHOLOGY

## 2022-03-07 ENCOUNTER — Other Ambulatory Visit: Payer: Self-pay | Admitting: Family

## 2022-03-07 DIAGNOSIS — N6320 Unspecified lump in the left breast, unspecified quadrant: Secondary | ICD-10-CM

## 2022-03-08 ENCOUNTER — Other Ambulatory Visit: Payer: Self-pay

## 2022-03-08 MED ORDER — WEGOVY 0.5 MG/0.5ML ~~LOC~~ SOAJ: 0.5000 mg | SUBCUTANEOUS | 1 refills | Status: DC

## 2022-03-26 ENCOUNTER — Other Ambulatory Visit: Payer: Self-pay | Admitting: Psychiatry

## 2022-03-26 DIAGNOSIS — F411 Generalized anxiety disorder: Secondary | ICD-10-CM

## 2022-04-05 ENCOUNTER — Other Ambulatory Visit: Payer: Self-pay

## 2022-04-05 MED ORDER — WEGOVY 1 MG/0.5ML ~~LOC~~ SOAJ: 1.0000 mg | SUBCUTANEOUS | 1 refills | Status: DC

## 2022-04-07 NOTE — Telephone Encounter (Signed)
Wil do

## 2022-04-11 NOTE — Progress Notes (Deleted)
GYNECOLOGY ANNUAL PHYSICAL EXAM PROGRESS NOTE  Subjective:    Andrea Roberson is a 36 y.o. G13P2002 female who presents for an annual exam. The patient has no complaints today. The patient is sexually active. The patient participates in regular exercise: yes. Has the patient ever been transfused or tattooed?: no. The patient reports that there is not domestic violence in her life.    Menstrual History: Menarche age: 3 No LMP recorded.     Gynecologic History:  Contraception: tubal ligation History of STI's:  Denies Last Pap: 04/08/2020. Results were: normal.  Denies h/o abnormal pap smears.   OB History  Gravida Para Term Preterm AB Living  2 2 2  0 0 2  SAB IAB Ectopic Multiple Live Births  0 0 0 0 2    # Outcome Date GA Lbr Len/2nd Weight Sex Delivery Anes PTL Lv  2 Term 11/19/18 [redacted]w[redacted]d  7 lb 6.9 oz (3.37 kg) F CS-LTranv Spinal  LIV     Name: Seipp,GIRL Jozlin     Apgar1: 8  Apgar5: 8  1 Term 05/02/14 [redacted]w[redacted]d  7 lb 8 oz (3.402 kg) M CS-LTranv Spinal, EPI N LIV     Complications: Gestational diabetes mellitus in childbirth, insulin controlled, Excessive weight gain in pregnancy     Name: [name]     Apgar1: 8  Apgar5: 9    Past Medical History:  Diagnosis Date   Anxiety    Asthma    Bipolar disorder (HCC)    Depression    Frequent headaches    GERD (gastroesophageal reflux disease)    Gestational diabetes    History of gestational diabetes 2016   Hyperlipidemia    Migraines    Obesity (BMI 35.0-39.9 without comorbidity) 12/24/2015   Tarsal coalition of left foot    TMJ (dislocation of temporomandibular joint)    UTI (urinary tract infection)    Wears contact lenses     Past Surgical History:  Procedure Laterality Date   BREAST BIOPSY Left 03/03/2022   stereo bx of left breast asymmetry, ribbon marker, path pending   BREAST BIOPSY Left 03/03/2022   03/05/2022 bx of left axilla LN, NO MARKER PLACED, path pending   CESAREAN SECTION  2015   CESAREAN SECTION WITH  BILATERAL TUBAL LIGATION N/A 11/19/2018   Procedure: CESAREAN SECTION WITH BILATERAL TUBAL LIGATION;  Surgeon: 01/18/2019, MD;  Location: [facility] ORS;  Service: Obstetrics;  Laterality: N/A;  TOB Hildred Laser APGAR 8/8     COLONOSCOPY WITH PROPOFOL N/A 01/07/2022   Procedure: COLONOSCOPY WITH PROPOFOL;  Surgeon: 01/09/2022, MD;  Location[facility] END[facility]COPY;  Service: Gastroenterology;  Laterality: N/A;   ESOPHAGOGASTRODUODENOSCOPY (EGD) WITH PROPOFOL N/A 08/30/2017   Procedure: ESOPHAGOGASTRODUODENOSCOPY (EGD) WITH PROPOFOL;  Surgeon: 09/01/2017, MD;  Location: [facility];  Service: Endoscopy;  Laterality: N/A;   TUBAL LIGATION     WISDOM TOOTH EXTRACTION      Family History  Problem Relation Age of Onset   Heart disease Mother    Diabetes Mother    Anxiety disorder Mother    Arthritis Mother    Skin cancer Mother    Diabetes Father    Skin cancer Father    Arthritis Father    Colon polyps Father    Peptic Ulcer Disease Father    Colon polyps Brother    Heart disease Maternal Grandmother    Colon cancer Paternal Grandfather    Colon cancer Paternal Aunt    Thyroid cancer Neg Hx  Breast cancer Neg Hx     Social History   Socioeconomic History   Marital status: Married    Spouse name: Not on file   Number of children: Not on file   Years of education: Not on file   Highest education level: Not on file  Occupational History   Not on file  Tobacco Use   Smoking status: Former    Packs/day: 1.50    Years: 8.00    Total pack years: 12.00    Types: Cigarettes    Quit date: 09/16/2012    Years since quitting: 9.5   Smokeless tobacco: Never  Vaping Use   Vaping Use: Former  Substance and Sexual Activity   Alcohol use: Not Currently    Comment: 2 glasswine/month   Drug use: No   Sexual activity: Yes    Birth control/protection: None, Surgical    Comment: tubal  Other Topics Concern   Not on file  Social History Narrative   Married   5 yo son , 5  month daughter   [employer], broadcasting/film/video- pre K   Right handed      Social Determinants of Health   Financial Resource Strain: Not on file  Food Insecurity: Not on file  Transportation Needs: Not on file  Physical Activity: Not on file  Stress: Not on file  Social Connections: Not on file  Intimate Partner Violence: Not on file    Current Outpatient Medications on File Prior to Visit  Medication Sig Dispense Refill   benzonatate (TESSALON) 100 MG capsule Take 2 capsules (200 mg total) by mouth every 8 (eight) hours. 21 capsule 0   busPIRone (BUSPAR) 30 MG tablet Take 1 tablet (30 mg total) by mouth daily. 90 tablet 1   cetirizine (ZYRTEC) 10 MG tablet Take 10 mg by mouth daily as needed for allergies.      FLUoxetine (PROZAC) 40 MG capsule TAKE 1 CAPSULE(40 MG) BY MOUTH DAILY 30 capsule 0   fluticasone (FLONASE) 50 MCG/ACT nasal spray Place 2 sprays into both nostrils daily as needed for allergies.      hydrOXYzine (ATARAX/VISTARIL) 25 MG tablet Take 0.5-1 tablets (12.5-25 mg total) by mouth 2 (two) times daily as needed. For anxiety, sleep 60 tablet 1   ipratropium (ATROVENT) 0.06 % nasal spray Place 2 sprays into both nostrils 4 (four) times daily. 15 mL 12   meloxicam (MOBIC) 15 MG tablet Take 1 tablet (15 mg total) by mouth daily. 30 tablet 3   metFORMIN (GLUCOPHAGE) 500 MG tablet TAKE 1 TABLET BY MOUTH TWICE DAILY 180 tablet 3   minoxidil (ROGAINE WOMENS) 2 % external solution Apply topically 2 (two) times daily.     mometasone (ELOCON) 0.1 % lotion SMARTSIG:4 Drop(s) In Ear(s) Every Night PRN     pantoprazole (PROTONIX) 20 MG tablet Take 1 tablet (20 mg total) by mouth daily. 90 tablet 1   promethazine-dextromethorphan (PROMETHAZINE-DM) 6.25-15 MG/5ML syrup Take 5 mLs by mouth 4 (four) times daily as needed. 118 mL 0   propranolol ER (INDERAL LA) 60 MG 24 hr capsule Take 1 capsule (60 mg total) by mouth daily. 90 capsule 3   scopolamine (TRANSDERM-SCOP) 1 MG/3DAYS Place 1 patch (1.5 mg total)  onto the skin every 3 (three) days. 10 patch 0   Semaglutide-Weight Management (WEGOVY) 1 MG/0.5ML SOAJ Inject 1 mg into the skin once a week. 2 mL 1   SUMAtriptan (IMITREX) 100 MG tablet Take 1 tablet earliest onset of migraine.  May repeat in 2  hours if headache persists or recurs.  Maximum 2 tablets in 24 hours. 10 tablet 5   topiramate (TOPAMAX) 25 MG tablet Take 1 tablet (25 mg total) by mouth 2 (two) times daily. 120 tablet 2   Current Facility-Administered Medications on File Prior to Visit  Medication Dose Route Frequency Provider Last Rate Last Admin   betamethasone acetate-betamethasone sodium phosphate (CELESTONE) injection 3 mg  3 mg Intra-articular Once Felecia Shelling, DPM        Allergies  Allergen Reactions   Vyleesi [Bremelanotide] Itching, Nausea And Vomiting and Other (See Comments)    Flushing     Review of Systems Constitutional: negative for chills, fatigue, fevers and sweats Eyes: negative for irritation, redness and visual disturbance Ears, nose, mouth, throat, and face: negative for hearing loss, nasal congestion, snoring and tinnitus Respiratory: negative for asthma, cough, sputum Cardiovascular: negative for chest pain, dyspnea, exertional chest pressure/discomfort, irregular heart beat, palpitations and syncope Gastrointestinal: negative for abdominal pain, change in bowel habits, nausea and vomiting Genitourinary: negative for abnormal menstrual periods, genital lesions, sexual problems and vaginal discharge, dysuria and urinary incontinence Integument/breast: negative for breast lump, breast tenderness and nipple discharge Hematologic/lymphatic: negative for bleeding and easy bruising Musculoskeletal:negative for back pain and muscle weakness Neurological: negative for dizziness, headaches, vertigo and weakness Endocrine: negative for diabetic symptoms including polydipsia, polyuria and skin dryness Allergic/Immunologic: negative for hay fever and urticaria       Objective:  There were no vitals taken for this visit. There is no height or weight on file to calculate BMI.    General Appearance:    Alert, cooperative, no distress, appears stated age  Head:    Normocephalic, without obvious abnormality, atraumatic  Eyes:    PERRL, conjunctiva/corneas clear, EOM's intact, both eyes  Ears:    Normal external ear canals, both ears  Nose:   Nares normal, septum midline, mucosa normal, no drainage or sinus tenderness  Throat:   Lips, mucosa, and tongue normal; teeth and gums normal  Neck:   Supple, symmetrical, trachea midline, no adenopathy; thyroid: no enlargement/tenderness/nodules; no carotid bruit or JVD  Back:     Symmetric, no curvature, ROM normal, no CVA tenderness  Lungs:     Clear to auscultation bilaterally, respirations unlabored  Chest Wall:    No tenderness or deformity   Heart:    Regular rate and rhythm, S1 and S2 normal, no murmur, rub or gallop  Breast Exam:    No tenderness, masses, or nipple abnormality  Abdomen:     Soft, non-tender, bowel sounds active all four quadrants, no masses, no organomegaly.    Genitalia:    Pelvic:external genitalia normal, vagina without lesions, discharge, or tenderness, rectovaginal septum  normal. Cervix normal in appearance, no cervical motion tenderness, no adnexal masses or tenderness.  Uterus normal size, shape, mobile, regular contours, nontender.  Rectal:    Normal external sphincter.  No hemorrhoids appreciated. Internal exam not done.   Extremities:   Extremities normal, atraumatic, no cyanosis or edema  Pulses:   2+ and symmetric all extremities  Skin:   Skin color, texture, turgor normal, no rashes or lesions  Lymph nodes:   Cervical, supraclavicular, and axillary nodes normal  Neurologic:   CNII-XII intact, normal strength, sensation and reflexes throughout   .  Labs:  Lab Results  Component Value Date   WBC 8.5 11/22/2021   HGB 13.0 11/22/2021   HCT 39.3 11/22/2021   MCV 86.8  11/22/2021   PLT 310  11/22/2021    Lab Results  Component Value Date   CREATININE 0.75 11/29/2021   BUN 10 11/29/2021   NA 137 11/29/2021   K 3.8 11/29/2021   CL 103 11/29/2021   CO2 28 11/29/2021    Lab Results  Component Value Date   ALT 18 11/22/2021   AST 19 11/22/2021   ALKPHOS 81 11/22/2021   BILITOT 0.7 11/22/2021    Lab Results  Component Value Date   TSH 1.670 04/08/2021     Assessment:   No diagnosis found.   Plan:  Blood tests: {blood tests:13147}. Breast self exam technique reviewed and patient encouraged to perform self-exam monthly. Contraception: tubal ligation. Discussed healthy lifestyle modifications. Mammogram  : Not age appropriate Pap smear  UTD . COVID vaccination status: Follow up in 1 year for annual exam   Hildred Laser, MD [facility]

## 2022-04-13 ENCOUNTER — Telehealth: Payer: Self-pay | Admitting: *Deleted

## 2022-04-13 ENCOUNTER — Encounter: Payer: BC Managed Care – PPO | Admitting: Obstetrics and Gynecology

## 2022-04-13 NOTE — Telephone Encounter (Signed)
Patient is requesting an air cast or something much lighter than the boot, is traveling,will be doing a lot of walking and the boot is too heavy and bulky. Please advise.

## 2022-04-15 ENCOUNTER — Other Ambulatory Visit: Payer: Self-pay | Admitting: Family

## 2022-04-15 DIAGNOSIS — E669 Obesity, unspecified: Secondary | ICD-10-CM

## 2022-04-15 MED ORDER — SAXENDA 18 MG/3ML ~~LOC~~ SOPN: 0.6000 mg | PEN_INJECTOR | Freq: Every day | SUBCUTANEOUS | 3 refills | Status: DC

## 2022-04-19 ENCOUNTER — Encounter: Payer: Self-pay | Admitting: Family

## 2022-04-20 ENCOUNTER — Other Ambulatory Visit: Payer: Self-pay

## 2022-04-20 DIAGNOSIS — F411 Generalized anxiety disorder: Secondary | ICD-10-CM

## 2022-04-20 MED ORDER — FLUOXETINE HCL 40 MG PO CAPS: ORAL_CAPSULE | ORAL | 0 refills | Status: DC

## 2022-05-10 ENCOUNTER — Other Ambulatory Visit: Payer: Self-pay | Admitting: Family

## 2022-05-10 DIAGNOSIS — E669 Obesity, unspecified: Secondary | ICD-10-CM

## 2022-05-10 MED ORDER — SAXENDA 18 MG/3ML ~~LOC~~ SOPN: 1.2000 mg | PEN_INJECTOR | Freq: Every day | SUBCUTANEOUS | 3 refills | Status: DC

## 2022-05-26 ENCOUNTER — Other Ambulatory Visit: Payer: Self-pay | Admitting: Family

## 2022-05-26 DIAGNOSIS — E669 Obesity, unspecified: Secondary | ICD-10-CM

## 2022-06-17 ENCOUNTER — Telehealth: Payer: Self-pay | Admitting: Family

## 2022-06-17 DIAGNOSIS — E669 Obesity, unspecified: Secondary | ICD-10-CM

## 2022-06-17 MED ORDER — WEGOVY 0.25 MG/0.5ML ~~LOC~~ SOAJ: 0.2500 mg | SUBCUTANEOUS | 2 refills | Status: DC

## 2022-06-17 NOTE — Telephone Encounter (Signed)
Call pt  Stop saxenda  I have sent in wegovy 0.25mg   as she requested  We can discuss increase of wegovy after 4 weeks when she is here 07/29/22

## 2022-06-17 NOTE — Telephone Encounter (Signed)
Spoke to patient and informed her to  stop [place] and begin the [place] that was sent in and will discuss increasing the wegovy at the next visit on 07/29/22. Patient verbalized understanding

## 2022-06-24 ENCOUNTER — Other Ambulatory Visit: Payer: Self-pay

## 2022-06-24 DIAGNOSIS — E669 Obesity, unspecified: Secondary | ICD-10-CM

## 2022-06-24 MED ORDER — WEGOVY 0.25 MG/0.5ML ~~LOC~~ SOAJ: 0.2500 mg | SUBCUTANEOUS | 2 refills | Status: DC

## 2022-06-24 NOTE — Telephone Encounter (Signed)
Call patient I got a refill request from [facility] for Wegovy 0.25 mg.  If she would like [facility] sent to [facility].  Please send

## 2022-06-24 NOTE — Telephone Encounter (Signed)
Called patient and got clarification that she did want the wegovy to go to [facility]. SENT!

## 2022-06-25 IMAGING — MG MM BREAST LOCALIZATION CLIP
4 series · 4 of 12 positions shown · non-contrast
Comparison: Previous exam(s).

CLINICAL DATA: Post biopsy mammogram of the left breast for clip
placement.

EXAM:
3D DIAGNOSTIC LEFT MAMMOGRAM POST STEREOTACTIC BIOPSY

[L ML synth-2D]
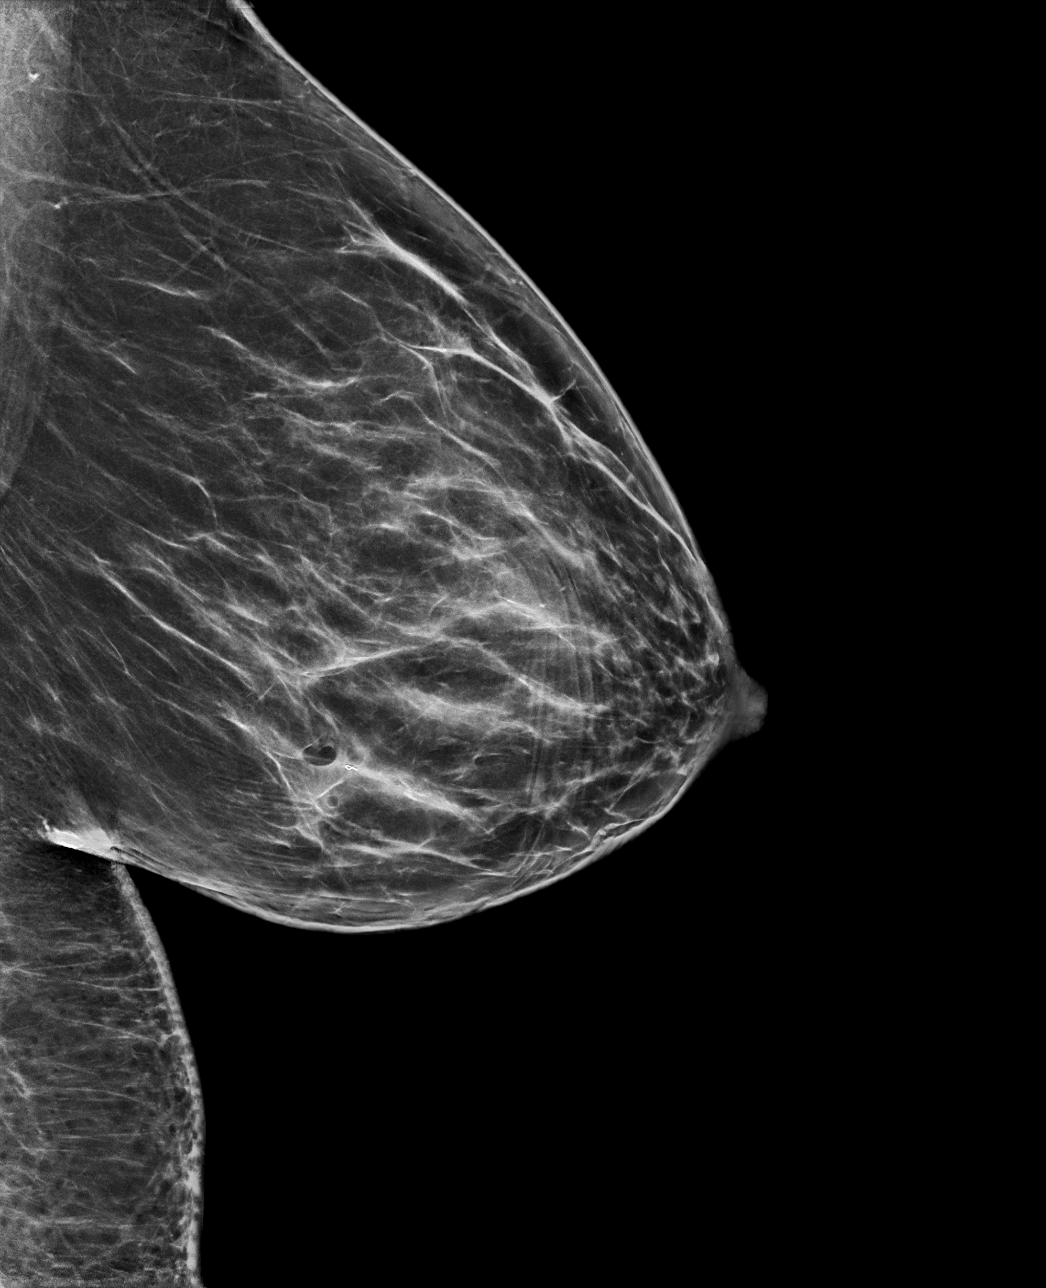

[L CC synth-2D]
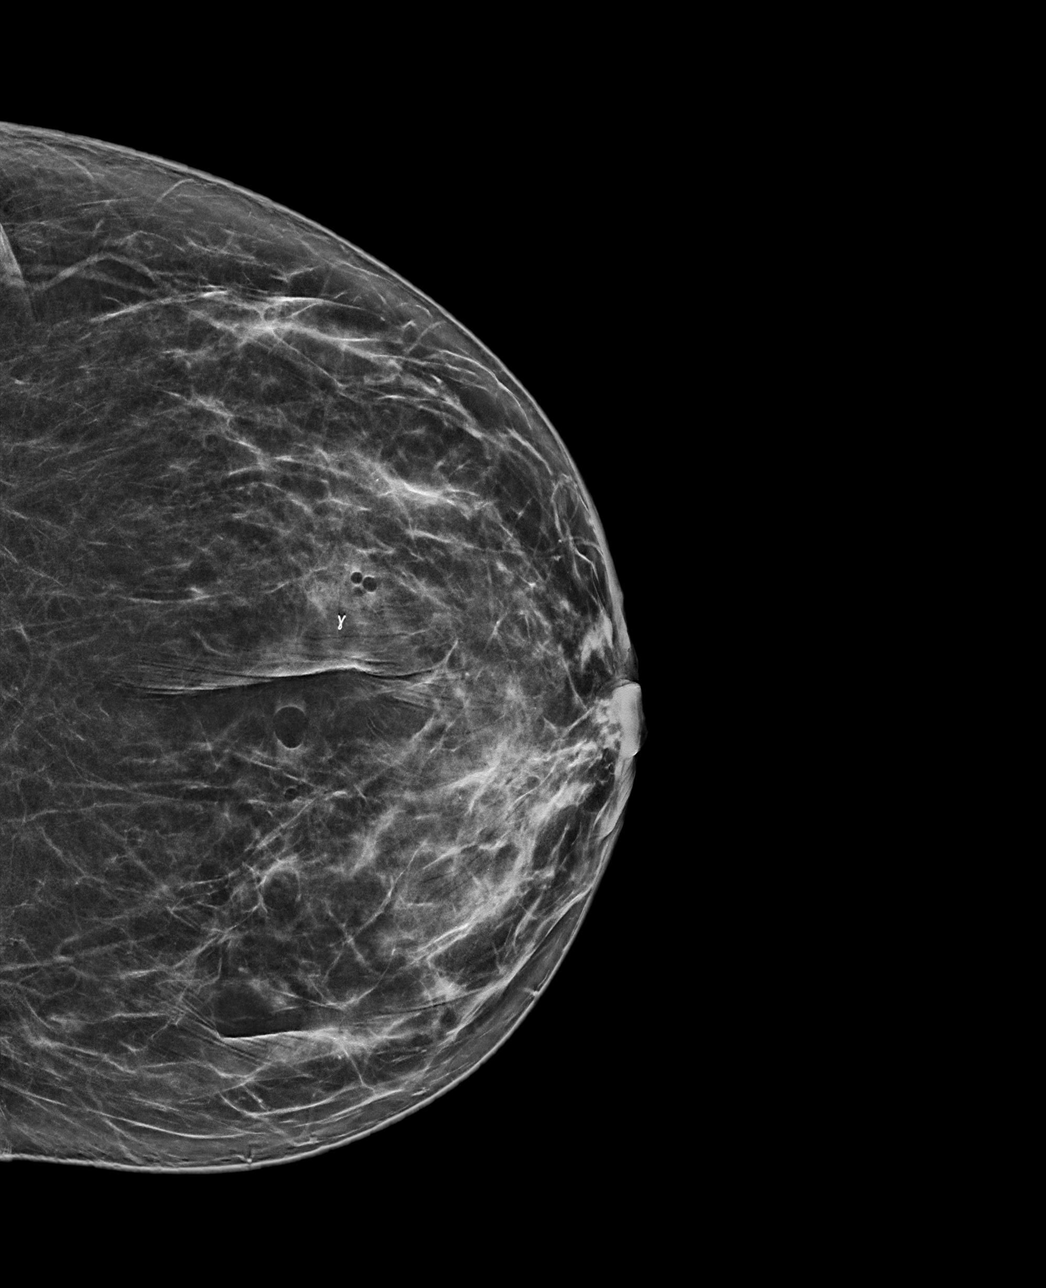

[L ML tomo · tomo slice 45/88.0]
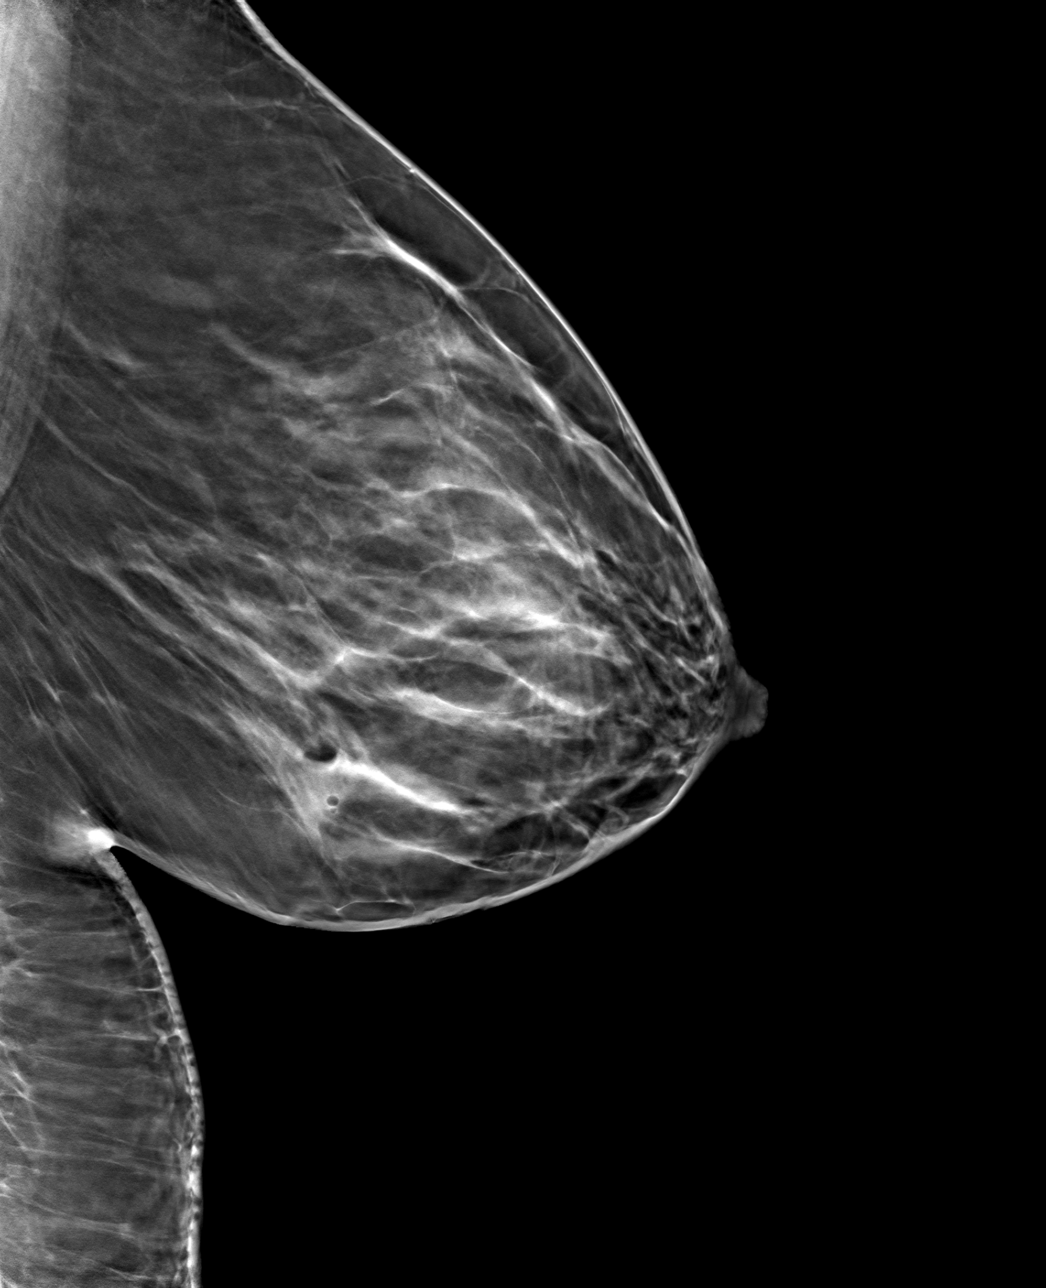

[L CC tomo · tomo slice 37/74.0]
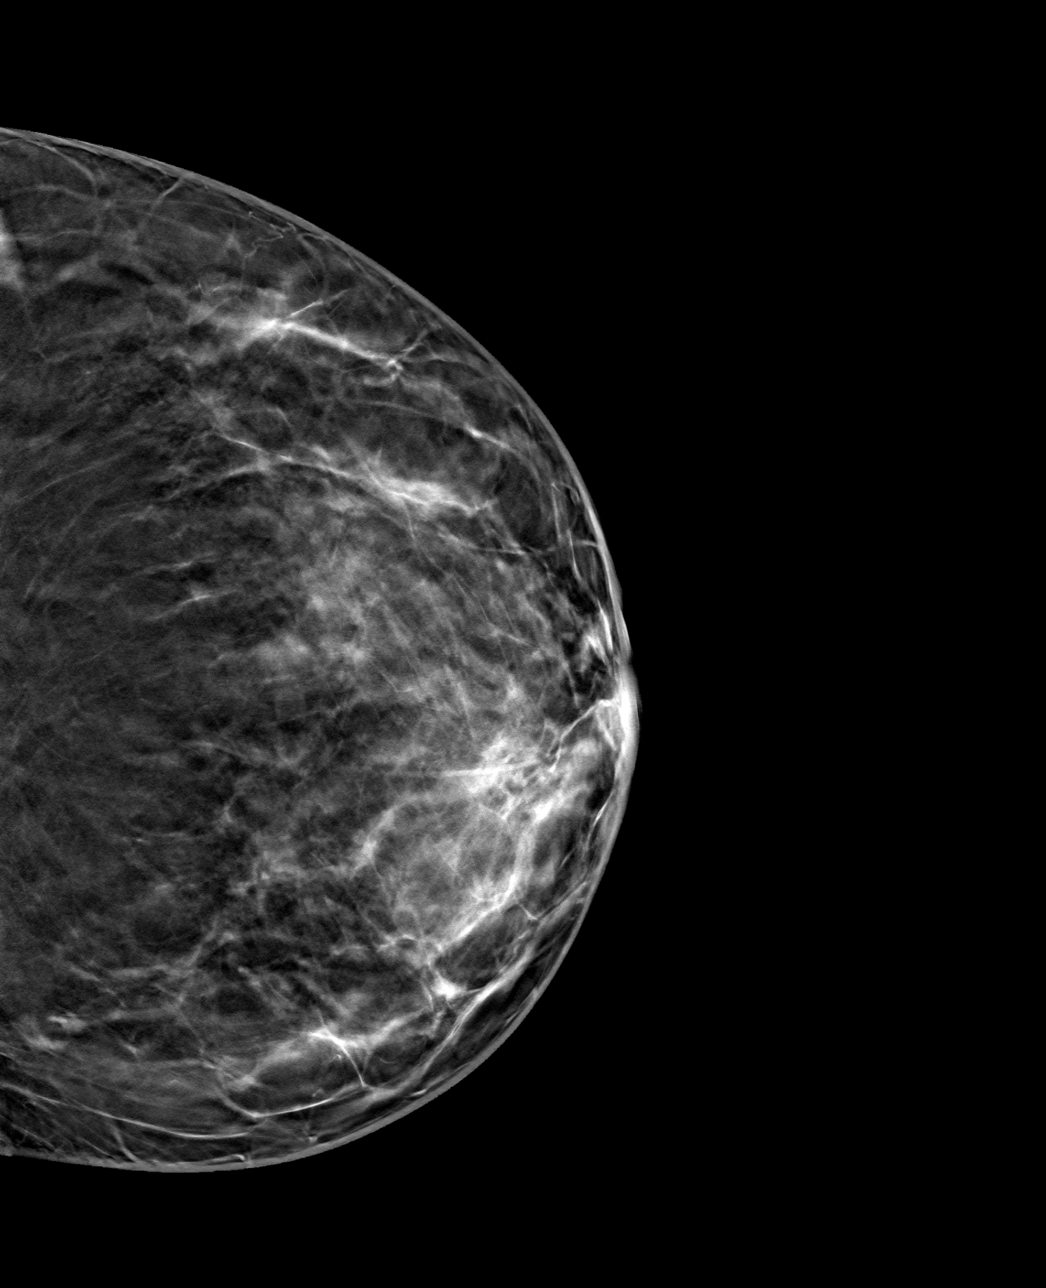

[4 of 12 positions shown; findings below may reference images not displayed]

FINDINGS: 3D Mammographic images were obtained following stereotactic guided
biopsy of the lower-inner left breast. The biopsy marking clip is
migrated between 3-5 cm lateral from the site of biopsy.
IMPRESSION: The ribbon shaped biopsy marking clip has migrated laterally from
the site of biopsy in the lower-inner left breast by between 3-5 cm.

Final Assessment: Post Procedure Mammograms for Marker Placement

## 2022-07-19 ENCOUNTER — Ambulatory Visit: Payer: BC Managed Care – PPO | Admitting: Podiatry

## 2022-07-19 DIAGNOSIS — M25572 Pain in left ankle and joints of left foot: Secondary | ICD-10-CM

## 2022-07-19 MED ORDER — DICLOFENAC SODIUM 75 MG PO TBEC: 75.0000 mg | DELAYED_RELEASE_TABLET | Freq: Two times a day (BID) | ORAL | 3 refills | Status: DC

## 2022-07-19 MED ORDER — METHYLPREDNISOLONE 4 MG PO TBPK: ORAL_TABLET | ORAL | 0 refills | Status: DC

## 2022-07-19 MED ORDER — BETAMETHASONE SOD PHOS & ACET 6 (3-3) MG/ML IJ SUSP
3.0000 mg | Freq: Once | INTRAMUSCULAR | Status: AC
  Administered 2022-07-19: 3 mg via INTRA_ARTICULAR

## 2022-07-19 NOTE — Progress Notes (Signed)
   Chief Complaint  Patient presents with   left anke pain    Patient is here for left ankle pain, would like injection for pain.patient states that the pain is still the same.    HPI: Patient presenting for follow-up evaluation of tarsal coalition and left foot and ankle pain.  Patient states over the past year she is continued to have some mild to moderate pain to the left foot and ankle.  She says the injections helped temporarily.  She has been taking the meloxicam with minimal relief.  No new complaints at this time  Past Medical History:  Diagnosis Date   Anxiety    Asthma    Bipolar disorder ([place])    Depression    Frequent headaches    GERD (gastroesophageal reflux disease)    Gestational diabetes    History of gestational diabetes 2016   Hyperlipidemia    Migraines    Obesity (BMI 35.0-39.9 without comorbidity) 12/24/2015   Tarsal coalition of left foot    TMJ (dislocation of temporomandibular joint)    UTI (urinary tract infection)    Wears contact lenses      Physical Exam: General: The patient is alert and oriented x3 in no acute distress.  Dermatology: Skin is warm, dry and supple bilateral lower extremities. Negative for open lesions or macerations.  Vascular: Palpable pedal pulses bilaterally. No edema or erythema noted. Capillary refill within normal limits.  Neurological: Epicritic and protective threshold grossly intact bilaterally.   Musculoskeletal Exam: Limited ROM with inversion and eversion of the subtalar joint. Muscle strength 5/5 in all groups bilateral.  There is some pain with inversion and eversion as well as pain on palpation of the sinus tarsi left.  CT FOOT LEFT WO CONTRAST 05/07/2019 IMPRESSION: 1. 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  Assessment: 1. Tarsal coalition left 2. Sinus tarsitis left 3. 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 Patient evaluated.  2. Injection  of 0.5 mLs Celestone Soluspan injected into the left sinus tarsi.  3.  Patient states that the meloxicam does not help alleviate much of her symptoms throughout the day.  We will try diclofenac.  Prescription for diclofenac 75 mg 2 times daily 4.  Prescription provided for the patient for custom molded orthotics to take to [facility] 5. Return to clinic as needed.   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 65yr old son.   Edrick Kins, DPM [facility]  Dr. Edrick Kins, DPM    [address], [place] [address]                Office [phone]  Fax [phone]

## 2022-07-27 ENCOUNTER — Other Ambulatory Visit: Payer: Self-pay | Admitting: Family

## 2022-07-27 DIAGNOSIS — F411 Generalized anxiety disorder: Secondary | ICD-10-CM

## 2022-07-27 NOTE — Telephone Encounter (Signed)
Rx sent patient notified 

## 2022-07-29 ENCOUNTER — Ambulatory Visit: Payer: BC Managed Care – PPO | Admitting: Family

## 2022-07-29 ENCOUNTER — Encounter: Payer: Self-pay | Admitting: Family

## 2022-07-29 VITALS — BP 118/80 | HR 73 | Temp 98.4°F | Ht 65.0 in | Wt 192.0 lb

## 2022-07-29 DIAGNOSIS — F32A Depression, unspecified: Secondary | ICD-10-CM

## 2022-07-29 DIAGNOSIS — F411 Generalized anxiety disorder: Secondary | ICD-10-CM

## 2022-07-29 DIAGNOSIS — F419 Anxiety disorder, unspecified: Secondary | ICD-10-CM | POA: Diagnosis not present

## 2022-07-29 DIAGNOSIS — E669 Obesity, unspecified: Secondary | ICD-10-CM

## 2022-07-29 MED ORDER — BUPROPION HCL ER (XL) 150 MG PO TB24: 150.0000 mg | ORAL_TABLET | Freq: Every day | ORAL | 1 refills | Status: DC

## 2022-07-29 MED ORDER — FLUOXETINE HCL 40 MG PO CAPS: 40.0000 mg | ORAL_CAPSULE | Freq: Every day | ORAL | 3 refills | Status: DC

## 2022-07-29 NOTE — Assessment & Plan Note (Signed)
Weight has plateaued.  We opted to trial Wellbutrin to decrease appetite while Wegovy is backordered.  She will continue metformin 500 mg twice daily.  Counseled her on irritation, increased anxiety.  She will let me know how she is doing so we can increase dose as appropriate.

## 2022-07-29 NOTE — Assessment & Plan Note (Signed)
Chronic, stable.  Continue Prozac 40mg.  

## 2022-07-29 NOTE — Progress Notes (Signed)
Subjective:    Patient ID: Andrea Roberson, female    DOB: 03-24-1986, 36 y.o.   MRN: [id number]  CC: Andrea Roberson is a 36 y.o. female who presents today for follow up.   HPI: Physical today.  No new complaints.  She is compliant metformin 500 mg twice daily.  She been unable to secure doses of Saxenda or Wegovy due to backorder.  She is working out 3-4 times per week.  Eating healthy and weight is plateaued.  She has previously tried phentermine for weight loss   depression and anxiety overall well controlled at this time.  She is compliant with Prozac 40 mg.  Denies history of seizure, anorexia or bulimia.  No heavy alcohol use.  HISTORY:  Past Medical History:  Diagnosis Date   Anxiety    Asthma    Bipolar disorder ([place])    Depression    Frequent headaches    GERD (gastroesophageal reflux disease)    Gestational diabetes    History of gestational diabetes 2016   Hyperlipidemia    Migraines    Obesity (BMI 35.0-39.9 without comorbidity) 12/24/2015   Tarsal coalition of left foot    TMJ (dislocation of temporomandibular joint)    UTI (urinary tract infection)    Wears contact lenses    Past Surgical History:  Procedure Laterality Date   BREAST BIOPSY Left 03/03/2022   stereo bx of left breast asymmetry, ribbon marker, path pending   BREAST BIOPSY Left 03/03/2022   [place] bx of left axilla LN, NO MARKER PLACED, path pending   CESAREAN SECTION  2015   CESAREAN SECTION WITH BILATERAL TUBAL LIGATION N/A 11/19/2018   Procedure: CESAREAN SECTION WITH BILATERAL TUBAL LIGATION;  Surgeon: Rubie Maid, MD;  Location: [facility] ORS;  Service: Obstetrics;  Laterality: N/A;  TOB [id number] APGAR 8/8     COLONOSCOPY WITH PROPOFOL N/A 01/07/2022   Procedure: COLONOSCOPY WITH PROPOFOL;  Surgeon: Lin Landsman, MD;  Location: [facility] ENDOSCOPY;  Service: Gastroenterology;  Laterality: N/A;   ESOPHAGOGASTRODUODENOSCOPY (EGD) WITH PROPOFOL N/A 08/30/2017   Procedure:  ESOPHAGOGASTRODUODENOSCOPY (EGD) WITH PROPOFOL;  Surgeon: Lin Landsman, MD;  Location: [place];  Service: Endoscopy;  Laterality: N/A;   TUBAL LIGATION     WISDOM TOOTH EXTRACTION     Family History  Problem Relation Age of Onset   Heart disease Mother    Diabetes Mother    Anxiety disorder Mother    Arthritis Mother    Skin cancer Mother    Diabetes Father    Skin cancer Father    Arthritis Father    Colon polyps Father    Peptic Ulcer Disease Father    Colon polyps Brother    Heart disease Maternal Grandmother    Colon cancer Paternal Grandfather    Colon cancer Paternal Aunt    Thyroid cancer Neg Hx    Breast cancer Neg Hx     Allergies: Vyleesi [bremelanotide] Current Outpatient Medications on File Prior to Visit  Medication Sig Dispense Refill   cetirizine (ZYRTEC) 10 MG tablet Take 10 mg by mouth daily as needed for allergies.      diclofenac (VOLTAREN) 75 MG EC tablet Take 1 tablet (75 mg total) by mouth 2 (two) times daily. 60 tablet 3   fluticasone (FLONASE) 50 MCG/ACT nasal spray Place 2 sprays into both nostrils daily as needed for allergies.      hydrOXYzine (ATARAX/VISTARIL) 25 MG tablet Take 0.5-1 tablets (12.5-25 mg total) by mouth 2 (two)  times daily as needed. For anxiety, sleep 60 tablet 1   ipratropium (ATROVENT) 0.06 % nasal spray Place 2 sprays into both nostrils 4 (four) times daily. 15 mL 12   meloxicam (MOBIC) 15 MG tablet Take 1 tablet (15 mg total) by mouth daily. 30 tablet 3   metFORMIN (GLUCOPHAGE) 500 MG tablet TAKE 1 TABLET BY MOUTH TWICE DAILY 180 tablet 3   methylPREDNISolone (MEDROL DOSEPAK) 4 MG TBPK tablet 6 day dose pack - take as directed 21 tablet 0   minoxidil (ROGAINE WOMENS) 2 % external solution Apply topically 2 (two) times daily.     mometasone (ELOCON) 0.1 % lotion SMARTSIG:4 Drop(s) In Ear(s) Every Night PRN     pantoprazole (PROTONIX) 20 MG tablet Take 1 tablet (20 mg total) by mouth daily. 90 tablet 1    promethazine-dextromethorphan (PROMETHAZINE-DM) 6.25-15 MG/5ML syrup Take 5 mLs by mouth 4 (four) times daily as needed. 118 mL 0   propranolol ER (INDERAL LA) 60 MG 24 hr capsule Take 1 capsule (60 mg total) by mouth daily. 90 capsule 3   scopolamine (TRANSDERM-SCOP) 1 MG/3DAYS Place 1 patch (1.5 mg total) onto the skin every 3 (three) days. 10 patch 0   SUMAtriptan (IMITREX) 100 MG tablet Take 1 tablet earliest onset of migraine.  May repeat in 2 hours if headache persists or recurs.  Maximum 2 tablets in 24 hours. 10 tablet 5   topiramate (TOPAMAX) 25 MG tablet TAKE 1 TABLET(25 MG) BY MOUTH TWICE DAILY 120 tablet 2   Current Facility-Administered Medications on File Prior to Visit  Medication Dose Route Frequency Provider Last Rate Last Admin   betamethasone acetate-betamethasone sodium phosphate (CELESTONE) injection 3 mg  3 mg Intra-articular Once Edrick Kins, DPM        Social History   Tobacco Use   Smoking status: Former    Packs/day: 1.50    Years: 8.00    Total pack years: 12.00    Types: Cigarettes    Quit date: 09/16/2012    Years since quitting: 9.8   Smokeless tobacco: Never  Vaping Use   Vaping Use: Former  Substance Use Topics   Alcohol use: Not Currently    Comment: 2 glasswine/month   Drug use: No    Review of Systems  Constitutional:  Negative for chills and fever.  Respiratory:  Negative for cough.   Cardiovascular:  Negative for chest pain and palpitations.  Gastrointestinal:  Negative for nausea and vomiting.      Objective:    BP 118/80 (BP Location: Left Arm, Patient Position: Sitting, Cuff Size: Normal)   Pulse 73   Temp 98.4 F (36.9 C) (Oral)   Ht 5\' 5"  (1.651 m)   Wt 192 lb (87.1 kg)   LMP 07/14/2022   SpO2 97%   BMI 31.95 kg/m  BP Readings from Last 3 Encounters:  07/29/22 118/80  02/20/22 125/82  01/26/22 118/82   Wt Readings from Last 3 Encounters:  07/29/22 192 lb (87.1 kg)  02/20/22 193 lb (87.5 kg)  01/26/22 199 lb 9.6 oz  (90.5 kg)    Physical Exam Vitals reviewed.  Constitutional:      Appearance: She is well-developed.  Eyes:     Conjunctiva/sclera: Conjunctivae normal.  Cardiovascular:     Rate and Rhythm: Normal rate and regular rhythm.     Pulses: Normal pulses.     Heart sounds: Normal heart sounds.  Pulmonary:     Effort: Pulmonary effort is normal.  Breath sounds: Normal breath sounds. No wheezing, rhonchi or rales.  Skin:    General: Skin is warm and dry.  Neurological:     Mental Status: She is alert.  Psychiatric:        Speech: Speech normal.        Behavior: Behavior normal.        Thought Content: Thought content normal.        Assessment & Plan:   Problem List Items Addressed This Visit       Other   Anxiety and depression    Chronic, stable.  Continue Prozac 40 mg.      Relevant Medications   buPROPion (WELLBUTRIN XL) 150 MG 24 hr tablet   FLUoxetine (PROZAC) 40 MG capsule   Generalized anxiety disorder - Primary   Relevant Medications   buPROPion (WELLBUTRIN XL) 150 MG 24 hr tablet   FLUoxetine (PROZAC) 40 MG capsule   Obesity (BMI 35.0-39.9 without comorbidity)    Weight has plateaued.  We opted to trial Wellbutrin to decrease appetite while Wegovy is backordered.  She will continue metformin 500 mg twice daily.  Counseled her on irritation, increased anxiety.  She will let me know how she is doing so we can increase dose as appropriate.        I have discontinued Anju M. Marzano's busPIRone, benzonatate, and Wegovy. I have also changed her FLUoxetine. Additionally, I am having her start on buPROPion. Lastly, I am having her maintain her fluticasone, cetirizine, hydrOXYzine, mometasone, scopolamine, SUMAtriptan, Rogaine Womens, metFORMIN, meloxicam, propranolol ER, pantoprazole, ipratropium, promethazine-dextromethorphan, topiramate, methylPREDNISolone, and diclofenac. We will continue to administer betamethasone acetate-betamethasone sodium  phosphate.   Meds ordered this encounter  Medications   buPROPion (WELLBUTRIN XL) 150 MG 24 hr tablet    Sig: Take 1 tablet (150 mg total) by mouth daily with breakfast.    Dispense:  90 tablet    Refill:  1    Order Specific Question:   Supervising Provider    Answer:   Crecencio Mc [[id number]]   FLUoxetine (PROZAC) 40 MG capsule    Sig: Take 1 capsule (40 mg total) by mouth daily.    Dispense:  90 capsule    Refill:  3    Order Specific Question:   Supervising Provider    Answer:   Crecencio Mc [[id number]]    Return precautions given.   Risks, benefits, and alternatives of the medications and treatment plan prescribed today were discussed, and patient expressed understanding.   Education regarding symptom management and diagnosis given to patient on AVS.  Continue to follow with Burnard Hawthorne, FNP for routine health maintenance.   [place] and I agreed with plan.   Mable Paris, FNP

## 2022-08-01 ENCOUNTER — Encounter: Payer: Self-pay | Admitting: Obstetrics and Gynecology

## 2022-08-11 ENCOUNTER — Ambulatory Visit
Admission: EM | Admit: 2022-08-11 | Discharge: 2022-08-11 | Disposition: A | Discharge status: Home / Self Care | Payer: BC Managed Care – PPO | Attending: Emergency Medicine | Admitting: Emergency Medicine

## 2022-08-11 ENCOUNTER — Encounter: Payer: Self-pay | Admitting: Emergency Medicine

## 2022-08-11 DIAGNOSIS — N760 Acute vaginitis: Secondary | ICD-10-CM | POA: Diagnosis not present

## 2022-08-11 DIAGNOSIS — B9689 Other specified bacterial agents as the cause of diseases classified elsewhere: Secondary | ICD-10-CM | POA: Insufficient documentation

## 2022-08-11 LAB — URINALYSIS, ROUTINE W REFLEX MICROSCOPIC
Bilirubin Urine: NEGATIVE
Glucose, UA: NEGATIVE mg/dL
Hgb urine dipstick: NEGATIVE
Ketones, ur: NEGATIVE mg/dL
Leukocytes,Ua: NEGATIVE
Nitrite: NEGATIVE
Protein, ur: NEGATIVE mg/dL
Specific Gravity, Urine: <1.005 — ABNORMAL LOW (ref 1.005–1.030)
pH: 5.5 (ref 5.0–8.0)

## 2022-08-11 LAB — WET PREP, GENITAL
Sperm: NONE SEEN
Trich, Wet Prep: NONE SEEN
WBC, Wet Prep HPF POC: <10 — AB (ref <10)
Yeast Wet Prep HPF POC: NONE SEEN

## 2022-08-11 MED ORDER — METRONIDAZOLE 500 MG PO TABS: 500.0000 mg | ORAL_TABLET | Freq: Two times a day (BID) | ORAL | 0 refills | Status: DC

## 2022-08-11 NOTE — ED Triage Notes (Signed)
Pt c/o nausea, lower back pain, pt states she is having urgency.

## 2022-08-11 NOTE — Discharge Instructions (Addendum)

## 2022-08-11 NOTE — ED Provider Notes (Signed)
[facility]    CSN: [id number] Arrival date & time: 08/11/22  1632      History   Chief Complaint Chief Complaint  Patient presents with   Dysuria   Urinary Frequency   Nausea    HPI Andrea Roberson is a 36 y.o. female.   HPI  36 year old female here for evaluation of urinary complaints.  Patient reports that she has been experiencing urinary urgency, frequency, low back pain, suprapubic cramping, and nausea for the past 2 days.  Today she noticed that she had some spotting when she wipes but otherwise she has not seen any blood in her urine.  She also denies fever or pain with urination.  Patient has not had any vaginal discharge or itching.  Past Medical History:  Diagnosis Date   Anxiety    Asthma    Bipolar disorder ([facility])    Depression    Frequent headaches    GERD (gastroesophageal reflux disease)    Gestational diabetes    History of gestational diabetes 2016   Hyperlipidemia    Migraines    Obesity (BMI 35.0-39.9 without comorbidity) 12/24/2015   Tarsal coalition of left foot    TMJ (dislocation of temporomandibular joint)    UTI (urinary tract infection)    Wears contact lenses     Patient Active Problem List   Diagnosis Date Noted   Mass in armpit 01/26/2022   Occult blood positive stool    Aphthous ulcer of ileum    Nausea 11/24/2021   Change in stool 11/23/2021   MDD (major depressive disorder), recurrent, in full remission (HCC) 10/06/2020   TMJ (dislocation of temporomandibular joint)    Migraines    MDD (major depressive disorder), recurrent, in partial remission (HCC) 01/28/2020   MDD (major depressive disorder), recurrent episode, mild (HCC) 06/19/2019   Bereavement 05/15/2019   Insomnia due to medical condition 05/15/2019   Elevated cholesterol 04/24/2019   Dyspepsia    Constipation 05/12/2017   Generalized anxiety disorder 01/27/2016   Obesity (BMI 35.0-39.9 without comorbidity) 12/24/2015   Anxiety and depression  12/08/2015    Past Surgical History:  Procedure Laterality Date   BREAST BIOPSY Left 03/03/2022   stereo bx of left breast asymmetry, ribbon marker, path pending   BREAST BIOPSY Left 03/03/2022   [place] bx of left axilla LN, NO MARKER PLACED, path pending   CESAREAN SECTION  2015   CESAREAN SECTION WITH BILATERAL TUBAL LIGATION N/A 11/19/2018   Procedure: CESAREAN SECTION WITH BILATERAL TUBAL LIGATION;  Surgeon: Hildred Laser, MD;  Location: [facility] ORS;  Service: Obstetrics;  Laterality: N/A;  TOB [id number] APGAR 8/8     COLONOSCOPY WITH PROPOFOL N/A 01/07/2022   Procedure: COLONOSCOPY WITH PROPOFOL;  Surgeon: Toney Reil, MD;  Location: [facility] ENDOSCOPY;  Service: Gastroenterology;  Laterality: N/A;   ESOPHAGOGASTRODUODENOSCOPY (EGD) WITH PROPOFOL N/A 08/30/2017   Procedure: ESOPHAGOGASTRODUODENOSCOPY (EGD) WITH PROPOFOL;  Surgeon: Toney Reil, MD;  Location: [facility];  Service: Endoscopy;  Laterality: N/A;   TUBAL LIGATION     WISDOM TOOTH EXTRACTION      OB History     Gravida  2   Para  2   Term  2   Preterm      AB      Living  2      SAB      IAB      Ectopic      Multiple  0   Live Births  2  Home Medications    Prior to Admission medications   Medication Sig Start Date End Date Taking? Authorizing Provider  buPROPion (WELLBUTRIN XL) 150 MG 24 hr tablet Take 1 tablet (150 mg total) by mouth daily with breakfast. 07/29/22  Yes Arnett, Lyn Records, FNP  cetirizine (ZYRTEC) 10 MG tablet Take 10 mg by mouth daily as needed for allergies.    Yes [provider]  diclofenac (VOLTAREN) 75 MG EC tablet Take 1 tablet (75 mg total) by mouth 2 (two) times daily. 07/19/22  Yes Felecia Shelling, DPM  FLUoxetine (PROZAC) 40 MG capsule Take 1 capsule (40 mg total) by mouth daily. 07/29/22  Yes Arnett, Lyn Records, FNP  fluticasone (FLONASE) 50 MCG/ACT nasal spray Place 2 sprays into both nostrils daily as needed for allergies.    Yes  [provider]  hydrOXYzine (ATARAX/VISTARIL) 25 MG tablet Take 0.5-1 tablets (12.5-25 mg total) by mouth 2 (two) times daily as needed. For anxiety, sleep 09/05/19  Yes Eappen, Saramma, MD  ipratropium (ATROVENT) 0.06 % nasal spray Place 2 sprays into both nostrils 4 (four) times daily. 02/20/22  Yes Becky Augusta, NP  meloxicam (MOBIC) 15 MG tablet Take 1 tablet (15 mg total) by mouth daily. 08/24/21  Yes Felecia Shelling, DPM  metFORMIN (GLUCOPHAGE) 500 MG tablet TAKE 1 TABLET BY MOUTH TWICE DAILY 08/02/21  Yes Felecia Shelling, DPM  methylPREDNISolone (MEDROL DOSEPAK) 4 MG TBPK tablet 6 day dose pack - take as directed 07/19/22  Yes Felecia Shelling, DPM  metroNIDAZOLE (FLAGYL) 500 MG tablet Take 1 tablet (500 mg total) by mouth 2 (two) times daily. 08/11/22  Yes Becky Augusta, NP  minoxidil (ROGAINE WOMENS) 2 % external solution Apply topically 2 (two) times daily.   Yes [provider]  mometasone (ELOCON) 0.1 % lotion SMARTSIG:4 Drop(s) In Ear(s) Every Night PRN 03/06/20  Yes [provider]  pantoprazole (PROTONIX) 20 MG tablet Take 1 tablet (20 mg total) by mouth daily. 11/23/21  Yes Allegra Grana, FNP  promethazine-dextromethorphan (PROMETHAZINE-DM) 6.25-15 MG/5ML syrup Take 5 mLs by mouth 4 (four) times daily as needed. 02/20/22  Yes Becky Augusta, NP  propranolol ER (INDERAL LA) 60 MG 24 hr capsule Take 1 capsule (60 mg total) by mouth daily. 09/13/21  Yes Arnett, Lyn Records, FNP  scopolamine (TRANSDERM-SCOP) 1 MG/3DAYS Place 1 patch (1.5 mg total) onto the skin every 3 (three) days. 10/22/20  Yes Allegra Grana, FNP  SUMAtriptan (IMITREX) 100 MG tablet Take 1 tablet earliest onset of migraine.  May repeat in 2 hours if headache persists or recurs.  Maximum 2 tablets in 24 hours. 12/04/20  Yes Jaffe, Adam R, DO  topiramate (TOPAMAX) 25 MG tablet TAKE 1 TABLET(25 MG) BY MOUTH TWICE DAILY 05/26/22  Yes Arnett, Lyn Records, FNP    Family History Family History  Problem  Relation Age of Onset   Heart disease Mother    Diabetes Mother    Anxiety disorder Mother    Arthritis Mother    Skin cancer Mother    Diabetes Father    Skin cancer Father    Arthritis Father    Colon polyps Father    Peptic Ulcer Disease Father    Colon polyps Brother    Heart disease Maternal Grandmother    Colon cancer Paternal Grandfather    Colon cancer Paternal Aunt    Thyroid cancer Neg Hx    Breast cancer Neg Hx     Social History Social History  Tobacco Use   Smoking status: Former    Packs/day: 1.50    Years: 8.00    Total pack years: 12.00    Types: Cigarettes    Quit date: 09/16/2012    Years since quitting: 9.9   Smokeless tobacco: Never  Vaping Use   Vaping Use: Former  Substance Use Topics   Alcohol use: Not Currently    Comment: 2 glasswine/month   Drug use: No     Allergies   Vyleesi [bremelanotide]   Review of Systems Review of Systems  Constitutional:  Negative for fever.  Gastrointestinal:  Positive for abdominal pain and nausea. Negative for vomiting.  Genitourinary:  Negative for dysuria, frequency, hematuria, urgency, vaginal discharge and vaginal pain.  Musculoskeletal:  Positive for back pain.  Hematological: Negative.   Psychiatric/Behavioral: Negative.       Physical Exam Triage Vital Signs ED Triage Vitals  Enc Vitals Group     BP 08/11/22 1657 128/76     Pulse Rate 08/11/22 1657 60     Resp --      Temp 08/11/22 1657 98 F (36.7 C)     Temp Source 08/11/22 1657 Oral     SpO2 08/11/22 1657 100 %     Weight 08/11/22 1655 190 lb (86.2 kg)     Height 08/11/22 1655 5\' 5"  (1.651 m)     Head Circumference --      Peak Flow --      Pain Score --      Pain Loc --      Pain Edu? --      Excl. in GC? --    No data found.  Updated Vital Signs BP 128/76 (BP Location: Left Arm)   Pulse 60   Temp 98 F (36.7 C) (Oral)   Ht 5\' 5"  (1.651 m)   Wt 190 lb (86.2 kg)   LMP 08/02/2022 (Approximate)   SpO2 100%   BMI 31.62  kg/m   Visual Acuity Right Eye Distance:   Left Eye Distance:   Bilateral Distance:    Right Eye Near:   Left Eye Near:    Bilateral Near:     Physical Exam Vitals and nursing note reviewed.  Constitutional:      Appearance: Normal appearance. She is not ill-appearing.  HENT:     Head: Normocephalic and atraumatic.  Cardiovascular:     Rate and Rhythm: Normal rate and regular rhythm.     Pulses: Normal pulses.     Heart sounds: Normal heart sounds. No murmur heard.    No friction rub. No gallop.  Pulmonary:     Effort: Pulmonary effort is normal.     Breath sounds: Normal breath sounds. No wheezing, rhonchi or rales.  Abdominal:     General: Abdomen is flat.     Palpations: Abdomen is soft.     Tenderness: There is abdominal tenderness. There is no right CVA tenderness, left CVA tenderness, guarding or rebound.  Skin:    General: Skin is warm and dry.     Capillary Refill: Capillary refill takes less than 2 seconds.     Findings: No erythema or rash.  Neurological:     General: No focal deficit present.     Mental Status: She is alert and oriented to person, place, and time.  Psychiatric:        Mood and Affect: Mood normal.        Behavior: Behavior normal.  Thought Content: Thought content normal.        Judgment: Judgment normal.      UC Treatments / Results  Labs (all labs ordered are listed, but only abnormal results are displayed) Labs Reviewed  WET PREP, GENITAL - Abnormal; Notable for the following components:      Result Value   Clue Cells Wet Prep HPF POC PRESENT (*)    WBC, Wet Prep HPF POC <10 (*)    All other components within normal limits  URINALYSIS, ROUTINE W REFLEX MICROSCOPIC - Abnormal; Notable for the following components:   Specific Gravity, Urine <1.005 (*)    All other components within normal limits    EKG   Radiology No results found.  Procedures Procedures (including critical care time)  Medications Ordered in  UC Medications - No data to display  Initial Impression / Assessment and Plan / UC Course  I have reviewed the triage vital signs and the nursing notes.  Pertinent labs & imaging results that were available during my care of the patient were reviewed by me and considered in my medical decision making (see chart for details).   Patient is a nontoxic-appearing 36 year old female here for evaluation of urinary and GI complaints as outlined in HPI above.  Her symptoms have been present for the past 2 days.  Her physical exam reveals a benign cardiopulmonary exam.  No CVA tenderness on exam.  Abdomen has diffuse generalized abdominal tenderness without any guarding or rebound.  She also has generalized low back discomfort.  I will order urinalysis to look for the presence of infection.  Urinalysis shows a low specific gravity of less than 1.005 but otherwise no abnormalities.  It is negative for leukocyte esterase, nitrates, and protein.  Also no ketones.  Given the lack of urine findings to explain the patient's symptoms I will order a vaginal wet prep to look for the presence of possible BV or vaginal yeast infection that could be causing the patient's symptoms.  Vaginal wet prep is positive for clue cells.  I will discharge patient with a diagnosis of bacterial vaginosis and treat her with metronidazole twice daily for 7 days.  Return precautions reviewed.   Final Clinical Impressions(s) / UC Diagnoses   Final diagnoses:  BV (bacterial vaginosis)     Discharge Instructions      Take the Flagyl (metronidazole) 500 mg twice daily for treatment of your bacterial vaginosis.  Avoid alcohol while on the metronidazole as taken together will cause of vomiting.  Bacterial vaginosis is often caused by a imbalance of bacteria in your vaginal vault.  This is sometimes a result of using tampons or hormonal fluctuations during her menstrual cycle.  You if your symptoms are recurrent you can try  using a boric acid suppository twice weekly to help maintain the acid-base balance in your vagina vault which could prevent further infection.  You can also try vaginal probiotics to help return normal bacterial balance.      ED Prescriptions     Medication Sig Dispense Auth. Provider   metroNIDAZOLE (FLAGYL) 500 MG tablet Take 1 tablet (500 mg total) by mouth 2 (two) times daily. 14 tablet Margarette Canada, NP      PDMP not reviewed this encounter.   Margarette Canada, NP 08/11/22 1757

## 2022-08-16 ENCOUNTER — Encounter: Payer: Self-pay | Admitting: Family

## 2022-08-16 NOTE — Telephone Encounter (Signed)
I called to offer patient same day appointment with Joycelyn Schmid for tomorrow. Pt stated that she would call back to let us know due to her mom being in ED.

## 2022-08-17 ENCOUNTER — Other Ambulatory Visit: Payer: Self-pay | Admitting: Family

## 2022-08-17 DIAGNOSIS — B9689 Other specified bacterial agents as the cause of diseases classified elsewhere: Secondary | ICD-10-CM

## 2022-08-17 MED ORDER — METRONIDAZOLE 0.75 % EX GEL: 1.0000 | Freq: Every day | CUTANEOUS | 0 refills | Status: AC

## 2022-08-17 NOTE — Progress Notes (Signed)
close

## 2022-08-31 ENCOUNTER — Emergency Department: Payer: BC Managed Care – PPO

## 2022-08-31 ENCOUNTER — Emergency Department
Admission: EM | Admit: 2022-08-31 | Discharge: 2022-08-31 | Disposition: A | Discharge status: Home / Self Care | Payer: BC Managed Care – PPO | Attending: Emergency Medicine | Admitting: Emergency Medicine

## 2022-08-31 DIAGNOSIS — M545 Low back pain, unspecified: Secondary | ICD-10-CM | POA: Diagnosis not present

## 2022-08-31 DIAGNOSIS — J45909 Unspecified asthma, uncomplicated: Secondary | ICD-10-CM | POA: Diagnosis not present

## 2022-08-31 DIAGNOSIS — R1011 Right upper quadrant pain: Secondary | ICD-10-CM | POA: Diagnosis not present

## 2022-08-31 DIAGNOSIS — R109 Unspecified abdominal pain: Secondary | ICD-10-CM

## 2022-08-31 LAB — COMPREHENSIVE METABOLIC PANEL WITH GFR
ALT: 28 U/L (ref 0–44)
AST: 24 U/L (ref 15–41)
Albumin: 4.1 g/dL (ref 3.5–5.0)
Alkaline Phosphatase: 63 U/L (ref 38–126)
Anion gap: 7 (ref 5–15)
BUN: 14 mg/dL (ref 6–20)
CO2: 20 mmol/L — ABNORMAL LOW (ref 22–32)
Calcium: 8.6 mg/dL — ABNORMAL LOW (ref 8.9–10.3)
Chloride: 106 mmol/L (ref 98–111)
Creatinine, Ser: 0.83 mg/dL (ref 0.44–1.00)
GFR, Estimated: >60 mL/min
Glucose, Bld: 98 mg/dL (ref 70–99)
Potassium: 3.4 mmol/L — ABNORMAL LOW (ref 3.5–5.1)
Sodium: 133 mmol/L — ABNORMAL LOW (ref 135–145)
Total Bilirubin: 0.6 mg/dL (ref 0.3–1.2)
Total Protein: 7.8 g/dL (ref 6.5–8.1)

## 2022-08-31 LAB — URINALYSIS, ROUTINE W REFLEX MICROSCOPIC
Bacteria, UA: NONE SEEN
Bilirubin Urine: NEGATIVE
Glucose, UA: NEGATIVE mg/dL
Ketones, ur: NEGATIVE mg/dL
Leukocytes,Ua: NEGATIVE
Nitrite: NEGATIVE
Protein, ur: 30 mg/dL — AB
Specific Gravity, Urine: 1.023 (ref 1.005–1.030)
pH: 5 (ref 5.0–8.0)

## 2022-08-31 LAB — CBC
HCT: 38.4 % (ref 36.0–46.0)
Hemoglobin: 12.8 g/dL (ref 12.0–15.0)
MCH: 28.4 pg (ref 26.0–34.0)
MCHC: 33.3 g/dL (ref 30.0–36.0)
MCV: 85.3 fL (ref 80.0–100.0)
Platelets: 248 10*3/uL (ref 150–400)
RBC: 4.5 MIL/uL (ref 3.87–5.11)
RDW: 11.9 % (ref 11.5–15.5)
WBC: 10.6 10*3/uL — ABNORMAL HIGH (ref 4.0–10.5)
nRBC: 0 % (ref 0.0–0.2)

## 2022-08-31 LAB — POC URINE PREG, ED: Preg Test, Ur: NEGATIVE

## 2022-08-31 LAB — LIPASE, BLOOD: Lipase: 34 U/L (ref 11–51)

## 2022-08-31 LAB — TROPONIN I (HIGH SENSITIVITY): Troponin I (High Sensitivity): <2 ng/L (ref <18)

## 2022-08-31 MED ORDER — KETOROLAC TROMETHAMINE 15 MG/ML IJ SOLN
15.0000 mg | Freq: Once | INTRAMUSCULAR | Status: AC
  Administered 2022-08-31: 15 mg via INTRAVENOUS
  Filled 2022-08-31: qty 1

## 2022-08-31 MED ORDER — ONDANSETRON 4 MG PO TBDP
ORAL_TABLET | ORAL | Status: AC
  Filled 2022-08-31: qty 1

## 2022-08-31 MED ORDER — IOHEXOL 300 MG/ML  SOLN
100.0000 mL | Freq: Once | INTRAMUSCULAR | Status: AC | PRN
  Administered 2022-08-31: 100 mL via INTRAVENOUS

## 2022-08-31 MED ORDER — ONDANSETRON 4 MG PO TBDP
4.0000 mg | ORAL_TABLET | Freq: Once | ORAL | Status: AC
  Administered 2022-08-31: 4 mg via ORAL

## 2022-08-31 MED ORDER — MORPHINE SULFATE (PF) 4 MG/ML IV SOLN
4.0000 mg | Freq: Once | INTRAVENOUS | Status: AC
  Administered 2022-08-31: 4 mg via INTRAVENOUS
  Filled 2022-08-31: qty 1

## 2022-08-31 MED ORDER — ONDANSETRON 4 MG PO TBDP: 4.0000 mg | ORAL_TABLET | Freq: Three times a day (TID) | ORAL | 0 refills | Status: DC | PRN

## 2022-08-31 MED ORDER — LACTATED RINGERS IV BOLUS
1000.0000 mL | Freq: Once | INTRAVENOUS | Status: AC
  Administered 2022-08-31: 1000 mL via INTRAVENOUS

## 2022-08-31 NOTE — Discharge Instructions (Signed)
Your blood work urine sample and CAT scan were all reassuring.  I do not know exactly what the cause of your pain is.  You can take ibuprofen.  If your pain is worsening you develop fevers you are unable to drink because of nausea and vomiting please return to the emergency department.

## 2022-08-31 NOTE — ED Triage Notes (Signed)
Pt presents to the ED via POV due to increasing R flank pain that started at 3am this morning and is not getting better. Pt c/o N/V. Pain 8/10. Pt A&Ox4.

## 2022-08-31 NOTE — ED Provider Notes (Signed)
[facility] Provider Note    Event Date/Time   First MD Initiated Contact with Patient 08/31/22 1848     (approximate)   History   Flank Pain   HPI  Andrea Roberson is a 36 y.o. female with past medical history of obesity, hyperlipidemia, IBS, bipolar disorder who presents with abdominal and back pain.  Symptoms started around 3 AM.  Her pain is primarily in the lower abdomen but radiates to the right flank and right upper quadrant and then into the chest and bilateral ribs.  She denies shortness of breath has had about 4 episodes of vomiting and ongoing nausea no diarrhea or constipation.  Has had chills but no fevers.  Does endorse urinary urgency and frequency no blood in her urine.     Past Medical History:  Diagnosis Date   Anxiety    Asthma    Bipolar disorder ([place])    Depression    Frequent headaches    GERD (gastroesophageal reflux disease)    Gestational diabetes    History of gestational diabetes 2016   Hyperlipidemia    Migraines    Obesity (BMI 35.0-39.9 without comorbidity) 12/24/2015   Tarsal coalition of left foot    TMJ (dislocation of temporomandibular joint)    UTI (urinary tract infection)    Wears contact lenses     Patient Active Problem List   Diagnosis Date Noted   Mass in armpit 01/26/2022   Occult blood positive stool    Aphthous ulcer of ileum    Nausea 11/24/2021   Change in stool 11/23/2021   MDD (major depressive disorder), recurrent, in full remission ([place]) 10/06/2020   TMJ (dislocation of temporomandibular joint)    Migraines    MDD (major depressive disorder), recurrent, in partial remission ([place]) 01/28/2020   MDD (major depressive disorder), recurrent episode, mild ([place]) 06/19/2019   Bereavement 05/15/2019   Insomnia due to medical condition 05/15/2019   Elevated cholesterol 04/24/2019   Dyspepsia    Constipation 05/12/2017   Generalized anxiety disorder 01/27/2016   Obesity (BMI 35.0-39.9 without  comorbidity) 12/24/2015   Anxiety and depression 12/08/2015     Physical Exam  Triage Vital Signs: ED Triage Vitals  Enc Vitals Group     BP 08/31/22 1824 (!) 130/92     Pulse Rate 08/31/22 1824 (!) 128     Resp 08/31/22 1824 18     Temp 08/31/22 1824 98.8 F (37.1 C)     Temp Source 08/31/22 1824 Oral     SpO2 08/31/22 1824 97 %     Weight 08/31/22 1826 189 lb 9.5 oz (86 kg)     Height 08/31/22 1826 5\' 5"  (1.651 m)     Head Circumference --      Peak Flow --      Pain Score 08/31/22 1825 8     Pain Loc --      Pain Edu? --      Excl. in [place]? --     Most recent vital signs: Vitals:   08/31/22 1930 08/31/22 2038  BP: 121/79 118/82  Pulse: (!) 104 97  Resp: 18 16  Temp:  99 F (37.2 C)  SpO2: 99% 97%     General: Awake, patient appears uncomfortable CV:  Good peripheral perfusion.  Tachycardic Resp:  Normal effort.  Abd:  No distention.  Mild tenderness in the suprapubic right lower quadrant right upper quadrant and epigastric region, no CVA tenderness Neuro:  Awake, Alert, Oriented x 3  Other:     ED Results / Procedures / Treatments  Labs (all labs ordered are listed, but only abnormal results are displayed) Labs Reviewed  URINALYSIS, ROUTINE W REFLEX MICROSCOPIC - Abnormal; Notable for the following components:      Result Value   Color, Urine YELLOW (*)    APPearance HAZY (*)    Hgb urine dipstick MODERATE (*)    Protein, ur 30 (*)    All other components within normal limits  CBC - Abnormal; Notable for the following components:   WBC 10.6 (*)    All other components within normal limits  COMPREHENSIVE METABOLIC PANEL - Abnormal; Notable for the following components:   Sodium 133 (*)    Potassium 3.4 (*)    CO2 20 (*)    Calcium 8.6 (*)    All other components within normal limits  LIPASE, BLOOD  POC URINE PREG, ED  TROPONIN I (HIGH SENSITIVITY)     EKG  EKG reviewed interpreted myself shows sinus tach normal axis normal intervals  diffuse T wave inversions  RADIOLOGY    PROCEDURES:  Critical Care performed: No  .1-3 Lead EKG Interpretation  Performed by: Rada Hay, MD Authorized by: Rada Hay, MD     Interpretation: abnormal     ECG rate assessment: tachycardic     Rhythm: sinus tachycardia     Ectopy: none     Conduction: normal     The patient is on the cardiac monitor to evaluate for evidence of arrhythmia and/or significant heart rate changes.   MEDICATIONS ORDERED IN ED: Medications  ondansetron (ZOFRAN-ODT) 4 MG disintegrating tablet (has no administration in time range)  ondansetron (ZOFRAN-ODT) disintegrating tablet 4 mg (4 mg Oral Given 08/31/22 1835)  lactated ringers bolus 1,000 mL (1,000 mLs Intravenous New Bag/Given 08/31/22 1925)  morphine (PF) 4 MG/ML injection 4 mg (4 mg Intravenous Given 08/31/22 1931)  iohexol (OMNIPAQUE) 300 MG/ML solution 100 mL (100 mLs Intravenous Contrast Given 08/31/22 1941)  ketorolac (TORADOL) 15 MG/ML injection 15 mg (15 mg Intravenous Given 08/31/22 2036)     IMPRESSION / MDM / ASSESSMENT AND PLAN / ED COURSE  I reviewed the triage vital signs and the nursing notes.                              Patient's presentation is most consistent with acute complicated illness / injury requiring diagnostic workup.  Differential diagnosis includes, but is not limited to, pyelonephritis, nephrolithiasis, appendicitis, cholecystitis, pancreatitis  Patient is a 36 year old female presenting with lower abdominal and flank pain since 3 AM today.  This is associated with vomiting.  Patient points to lower abdomen when asked about where the pain is worst but pain does radiate to the right lower quadrant right flank right upper quadrant and to the chest.  Patient looks uncomfortable she is tachycardic on arrival she has no CVA tenderness does have some mild tenderness in lower quadrants and suprapubic region as well as the right upper quadrant and  epigastrium.  Labs are notable for mild leukocytosis.  Plan to obtain CT abdomen pelvis given broad differential.  Will obtain urine sample and treat pain with morphine and nausea with Zofran.   Patient's urine sample is clean other than some trace hematuria but patient is on her menstrual period.  CT abdomen pelvis has no acute findings.  I did obtain a troponin this patient  did complain of pain rating to the chest and had some abnormal T wave inversions on EKG troponin is undetectable.  On reassessment patient is feeling improved.  She did start her menstrual period today, so I suppose pain could be related to mensuration, although patient does not have a hx of this.   Patient was feeling improved after meds.  I did explain to the patient the reassuring work-up as well as the diagnostic uncertainty.  We discussed return precautions.  Recommended NSAIDs.  She is appropriate for discharge   Clinical Course as of 08/31/22 2058  Wed Aug 31, 2022  1939 Preg Test, Ur: Negative [KM]    Clinical Course User Index [KM] Georga Hacking, MD     FINAL CLINICAL IMPRESSION(S) / ED DIAGNOSES   Final diagnoses:  Flank pain  Abdominal pain, unspecified abdominal location     Rx / DC Orders   ED Discharge Orders          Ordered    ondansetron (ZOFRAN-ODT) 4 MG disintegrating tablet  Every 8 hours PRN        08/31/22 2058             Note:  This document was prepared using Dragon voice recognition software and may include unintentional dictation errors.   Georga Hacking, MD 08/31/22 [phone]

## 2022-09-01 ENCOUNTER — Ambulatory Visit
Admission: EM | Admit: 2022-09-01 | Discharge: 2022-09-01 | Disposition: A | Discharge status: Home / Self Care | Payer: BC Managed Care – PPO | Attending: Emergency Medicine | Admitting: Emergency Medicine

## 2022-09-01 ENCOUNTER — Encounter: Payer: Self-pay | Admitting: Family

## 2022-09-01 DIAGNOSIS — J020 Streptococcal pharyngitis: Secondary | ICD-10-CM | POA: Diagnosis present

## 2022-09-01 LAB — GROUP A STREP BY PCR: Group A Strep by PCR: DETECTED — AB

## 2022-09-01 MED ORDER — AMOXICILLIN 500 MG PO CAPS: 500.0000 mg | ORAL_CAPSULE | Freq: Two times a day (BID) | ORAL | 0 refills | Status: AC

## 2022-09-01 MED ORDER — ACETAMINOPHEN 325 MG PO TABS
650.0000 mg | ORAL_TABLET | Freq: Once | ORAL | Status: AC
  Administered 2022-09-01: 650 mg via ORAL

## 2022-09-01 NOTE — ED Triage Notes (Signed)
Pt c/o sore throat x1day  Pt believes she has strep throat  Pt was seen at the hospital on 08/31/22 and last had tylenol for pain at that time.

## 2022-09-01 NOTE — Telephone Encounter (Signed)
Spoke to patient and encouraged her to go to UC and get checked out . Pt stated that she would go.

## 2022-09-01 NOTE — ED Provider Notes (Signed)
[facility]    CSN: [id number] Arrival date & time: 09/01/22  1108      History   Chief Complaint Chief Complaint  Patient presents with   Sore Throat    HPI Andrea Roberson is a 36 y.o. female.   Patient presents with subjective fever, chills, body aches, sore throat, nasal congestion beginning overnight.  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  Has been unable to tolerate food for the last 2 days but tolerating fluids.  Has not attempted treatment today, has been using over-the-counter analgesics for discomfort.  Was evaluated 1 day ago in the emergency department for abdominal and bilateral lower back pain.  Unsure if related to new symptoms.  History of asthma.  Denies cough, shortness of breath or wheeze.  Past Medical History:  Diagnosis Date   Anxiety    Asthma    Bipolar disorder (H[facility])    Depression    Frequent headaches    GERD (gastroesophageal reflux disease)    Gestational diabetes    History of gestational diabetes 2016   Hyperlipidemia    Migraines    Obesity (BMI 35.0-39.9 without comorbidity) 12/24/2015   Tarsal coalition of left foot    TMJ (dislocation of temporomandibular joint)    UTI (urinary tract infection)    Wears contact lenses     Patient Active Problem List   Diagnosis Date Noted   Mass in armpit 01/26/2022   Occult blood positive stool    Aphthous ulcer of ileum    Nausea 11/24/2021   Change in stool 11/23/2021   MDD (major depressive disorder), recurrent, in full remission (H[facility]) 10/06/2020   TMJ (dislocation of temporomandibular joint)    Migraines    MDD (major depressive disorder), recurrent, in partial remission ([facility]) 01/28/2020   MDD (major depressive disorder), recurrent episode, mild ([facility]) 06/19/2019   Bereavement 05/15/2019   Insomnia due to medical condition 05/15/2019   Elevated cholesterol 04/24/2019   Dyspepsia    Constipation 05/12/2017   Generalized anxiety disorder 01/27/2016   Obesity (BMI  35.0-39.9 without comorbidity) 12/24/2015   Anxiety and depression 12/08/2015    Past Surgical History:  Procedure Laterality Date   BREAST BIOPSY Left 03/03/2022   stereo bx of left breast asymmetry, ribbon marker, path pending   BREAST BIOPSY Left 03/03/2022   [place] bx of left axilla LN, NO MARKER PLACED, path pending   CESAREAN SECTION  2015   CESAREAN SECTION WITH BILATERAL TUBAL LIGATION N/A 11/19/2018   Procedure: CESAREAN SECTION WITH BILATERAL TUBAL LIGATION;  Surgeon: Hildred Laser, MD;  Location: [facility];  Service: Obstetrics;  Laterality: N/A;  TOB 3419 APGAR 8/8     COLONOSCOPY WITH PROPOFOL N/A 01/07/2022   Procedure: COLONOSCOPY WITH PROPOFOL;  Surgeon: Toney Reil, MD;  Location: [facility];  Service: Gastroenterology;  Laterality: N/A;   ESOPHAGOGASTRODUODENOSCOPY (EGD) WITH PROPOFOL N/A 08/30/2017   Procedure: ESOPHAGOGASTRODUODENOSCOPY (EGD) WITH PROPOFOL;  Surgeon: Toney Reil, MD;  Location: [facility];  Service: Endoscopy;  Laterality: N/A;   TUBAL LIGATION     WISDOM TOOTH EXTRACTION      OB History     Gravida  2   Para  2   Term  2   Preterm      AB      Living  2      SAB      IAB      Ectopic      Multiple  0  Live Births  2            Home Medications    Prior to Admission medications   Medication Sig Start Date End Date Taking? Authorizing Provider  buPROPion (WELLBUTRIN XL) 150 MG 24 hr tablet Take 1 tablet (150 mg total) by mouth daily with breakfast. 07/29/22  Yes Arnett, Lyn RecordsMargaret G, FNP  cetirizine (ZYRTEC) 10 MG tablet Take 10 mg by mouth daily as needed for allergies.    Yes [provider]  diclofenac (VOLTAREN) 75 MG EC tablet Take 1 tablet (75 mg total) by mouth 2 (two) times daily. 07/19/22  Yes Felecia ShellingEvans, Brent M, DPM  FLUoxetine (PROZAC) 40 MG capsule Take 1 capsule (40 mg total) by mouth daily. 07/29/22  Yes Arnett, Lyn RecordsMargaret G, FNP  fluticasone (FLONASE) 50 MCG/ACT nasal spray  Place 2 sprays into both nostrils daily as needed for allergies.    Yes [provider]  hydrOXYzine (ATARAX/VISTARIL) 25 MG tablet Take 0.5-1 tablets (12.5-25 mg total) by mouth 2 (two) times daily as needed. For anxiety, sleep 09/05/19  Yes Eappen, Saramma, MD  ipratropium (ATROVENT) 0.06 % nasal spray Place 2 sprays into both nostrils 4 (four) times daily. 02/20/22  Yes Becky Augustayan, Jeremy, NP  meloxicam (MOBIC) 15 MG tablet Take 1 tablet (15 mg total) by mouth daily. 08/24/21  Yes Felecia ShellingEvans, Brent M, DPM  metFORMIN (GLUCOPHAGE) 500 MG tablet TAKE 1 TABLET BY MOUTH TWICE DAILY 08/02/21  Yes Felecia ShellingEvans, Brent M, DPM  minoxidil (ROGAINE WOMENS) 2 % external solution Apply topically 2 (two) times daily.   Yes [provider]  mometasone (ELOCON) 0.1 % lotion SMARTSIG:4 Drop(s) In Ear(s) Every Night PRN 03/06/20  Yes [provider]  ondansetron (ZOFRAN-ODT) 4 MG disintegrating tablet Take 1 tablet (4 mg total) by mouth every 8 (eight) hours as needed for nausea or vomiting. 08/31/22  Yes Georga HackingMcHugh, Kelly Rose, MD  propranolol ER (INDERAL LA) 60 MG 24 hr capsule Take 1 capsule (60 mg total) by mouth daily. 09/13/21  Yes Arnett, Lyn RecordsMargaret G, FNP  scopolamine (TRANSDERM-SCOP) 1 MG/3DAYS Place 1 patch (1.5 mg total) onto the skin every 3 (three) days. 10/22/20  Yes Allegra GranaArnett, Margaret G, FNP  SUMAtriptan (IMITREX) 100 MG tablet Take 1 tablet earliest onset of migraine.  May repeat in 2 hours if headache persists or recurs.  Maximum 2 tablets in 24 hours. 12/04/20  Yes Jaffe, Adam R, DO  topiramate (TOPAMAX) 25 MG tablet TAKE 1 TABLET(25 MG) BY MOUTH TWICE DAILY 05/26/22  Yes Allegra GranaArnett, Margaret G, FNP  methylPREDNISolone (MEDROL DOSEPAK) 4 MG TBPK tablet 6 day dose pack - take as directed 07/19/22   Felecia ShellingEvans, Brent M, DPM  metroNIDAZOLE (FLAGYL) 500 MG tablet Take 1 tablet (500 mg total) by mouth 2 (two) times daily. 08/11/22   Becky Augustayan, Jeremy, NP  pantoprazole (PROTONIX) 20 MG tablet Take 1 tablet (20 mg total) by  mouth daily. 11/23/21   Allegra GranaArnett, Margaret G, FNP  promethazine-dextromethorphan (PROMETHAZINE-DM) 6.25-15 MG/5ML syrup Take 5 mLs by mouth 4 (four) times daily as needed. 02/20/22   Becky Augustayan, Jeremy, NP    Family History Family History  Problem Relation Age of Onset   Heart disease Mother    Diabetes Mother    Anxiety disorder Mother    Arthritis Mother    Skin cancer Mother    Diabetes Father    Skin cancer Father    Arthritis Father    Colon polyps Father    Peptic Ulcer Disease Father    Colon  polyps Brother    Heart disease Maternal Grandmother    Colon cancer Paternal Grandfather    Colon cancer Paternal Aunt    Thyroid cancer Neg Hx    Breast cancer Neg Hx     Social History Social History   Tobacco Use   Smoking status: Former    Packs/day: 1.50    Years: 8.00    Total pack years: 12.00    Types: Cigarettes    Quit date: 09/16/2012    Years since quitting: 9.9   Smokeless tobacco: Never  Vaping Use   Vaping Use: Former  Substance Use Topics   Alcohol use: Not Currently    Comment: 2 glasswine/month   Drug use: No     Allergies   Vyleesi [bremelanotide]   Review of Systems Review of Systems  Constitutional:  Positive for chills and fever. Negative for activity change, appetite change, diaphoresis, fatigue and unexpected weight change.  HENT:  Positive for congestion, ear pain, rhinorrhea and sore throat. Negative for dental problem, drooling, ear discharge, facial swelling, hearing loss, mouth sores, nosebleeds, postnasal drip, sinus pressure, sinus pain, sneezing, tinnitus, trouble swallowing and voice change.   Respiratory: Negative.    Cardiovascular: Negative.   Musculoskeletal:  Positive for myalgias. Negative for arthralgias, back pain, gait problem, joint swelling, neck pain and neck stiffness.     Physical Exam Triage Vital Signs ED Triage Vitals  Enc Vitals Group     BP 09/01/22 1122 119/80     Pulse Rate 09/01/22 1122 92     Resp 09/01/22 1122  16     Temp 09/01/22 1122 (!) 100.4 F (38 C)     Temp Source 09/01/22 1122 Oral     SpO2 09/01/22 1122 100 %     Weight 09/01/22 1119 186 lb (84.4 kg)     Height 09/01/22 1119 5\' 5"  (1.651 m)     Head Circumference --      Peak Flow --      Pain Score 09/01/22 1119 6     Pain Loc --      Pain Edu? --      Excl. in [place]? --    No data found.  Updated Vital Signs BP 119/80 (BP Location: Left Arm)   Pulse 92   Temp (!) 100.4 F (38 C) (Oral)   Resp 16   Ht 5\' 5"  (1.651 m)   Wt 186 lb (84.4 kg)   LMP 09/01/2022 (Approximate)   SpO2 100%   BMI 30.95 kg/m   Visual Acuity Right Eye Distance:   Left Eye Distance:   Bilateral Distance:    Right Eye Near:   Left Eye Near:    Bilateral Near:     Physical Exam Constitutional:      Appearance: She is well-developed.  HENT:     Head: Normocephalic.     Right Ear: Tympanic membrane and ear canal normal.     Left Ear: Tympanic membrane and ear canal normal.     Nose: Congestion present. No rhinorrhea.     Mouth/Throat:     Mouth: Mucous membranes are moist.     Pharynx: Posterior oropharyngeal erythema present.     Tonsils: 0 on the right. 0 on the left.     Comments: Mild exudate present to the right side of the pharynx Cardiovascular:     Rate and Rhythm: Normal rate and regular rhythm.     Heart sounds: Normal heart sounds.  Pulmonary:  Effort: Pulmonary effort is normal.  Musculoskeletal:     Cervical back: Normal range of motion.  Lymphadenopathy:     Cervical: Cervical adenopathy present.  Skin:    General: Skin is warm and dry.  Neurological:     General: No focal deficit present.     Mental Status: She is alert and oriented to person, place, and time.      UC Treatments / Results  Labs (all labs ordered are listed, but only abnormal results are displayed) Labs Reviewed  GROUP A STREP BY PCR    EKG   Radiology CT ABDOMEN PELVIS W CONTRAST  Result Date: 08/31/2022 CLINICAL DATA:  Right lower  quadrant pain EXAM: CT ABDOMEN AND PELVIS WITH CONTRAST TECHNIQUE: Multidetector CT imaging of the abdomen and pelvis was performed using the standard protocol following bolus administration of intravenous contrast. RADIATION DOSE REDUCTION: This exam was performed according to the departmental dose-optimization program which includes automated exposure control, adjustment of the mA and/or kV according to patient size and/or use of iterative reconstruction technique. CONTRAST:  OMNIPAQUE IOHEXOL 300 MG/ML  SOLN COMPARISON:  None Available. FINDINGS: Lower chest: No acute abnormality. Hepatobiliary: No focal liver abnormality is seen. No gallstones, gallbladder wall thickening, or biliary dilatation. Pancreas: Unremarkable. No pancreatic ductal dilatation or surrounding inflammatory changes. Spleen: Normal in size without focal abnormality. Adrenals/Urinary Tract: Adrenal glands are normal. Kidneys show no hydronephrosis. 4 mm right pelvic calcification series 2, image 84, suspect separate from distal ureter. Bladder is unremarkable Stomach/Bowel: Stomach is within normal limits. Appendix appears normal. No evidence of bowel wall thickening, distention, or inflammatory changes. Vascular/Lymphatic: No significant vascular findings are present. No enlarged abdominal or pelvic lymph nodes. Reproductive: Tampon in the vagina. Uterus unremarkable. No adnexal mass Other: No abdominal wall hernia or abnormality. No abdominopelvic ascites. Musculoskeletal: No acute or significant osseous findings. IMPRESSION: No CT evidence for acute intra-abdominal or pelvic abnormality. Negative for acute appendicitis. Electronically Signed   By: Jasmine Pang M.D.   On: 08/31/2022 19:54    Procedures Procedures (including critical care time)  Medications Ordered in UC Medications  acetaminophen (TYLENOL) tablet 650 mg (has no administration in time range)    Initial Impression / Assessment and Plan / UC Course  I have  reviewed the triage vital signs and the nursing notes.  Pertinent labs & imaging results that were available during my care of the patient were reviewed by me and considered in my medical decision making (see chart for details).  Strep pharyngitis  Erythema with mild exudate to the oropharynx, discussed with patient, fever of 100.4 noted in triage, given Tylenol in office, strep PCR is positive, discussed findings with patient, most likely is the etiology of recently evaluated abdominal and low back pain, amoxicillin 10-day course prescribed, may use additional over-the-counter analgesics and medications for supportive care, may follow-up with urgent care as needed, work note given Final Clinical Impressions(s) / UC Diagnoses   Final diagnoses:  None   Discharge Instructions   None    ED Prescriptions   None    PDMP not reviewed this encounter.   Valinda Hoar, NP 09/01/22 1205

## 2022-09-01 NOTE — Discharge Instructions (Addendum)
Strep test is positivie   Begin amoxicillin every morning and every evening for 10 days, ideally you begin to see improvement in about 24 to 48 hours and steady progression from there  may attempt salt water gargles, throat lozenges, warm to cool liquids per preference, over-the-counter Chloraseptic spray and over-the-counter analgesics for management of discomfort,   may follow-up with urgent care as needed if symptoms persist or worsen,

## 2022-09-05 ENCOUNTER — Ambulatory Visit
Admission: RE | Admit: 2022-09-05 | Discharge: 2022-09-05 | Disposition: A | Discharge status: Home / Self Care | Payer: BC Managed Care – PPO | Source: Ambulatory Visit | Attending: Family | Admitting: Family

## 2022-09-05 DIAGNOSIS — N6320 Unspecified lump in the left breast, unspecified quadrant: Secondary | ICD-10-CM

## 2022-09-06 ENCOUNTER — Encounter: Payer: Self-pay | Admitting: Family

## 2022-09-22 ENCOUNTER — Encounter: Payer: Self-pay | Admitting: Obstetrics and Gynecology

## 2022-09-22 ENCOUNTER — Other Ambulatory Visit (HOSPITAL_COMMUNITY)
Admission: RE | Admit: 2022-09-22 | Discharge: 2022-09-22 | Disposition: A | Discharge status: Home / Self Care | Payer: BC Managed Care – PPO | Source: Ambulatory Visit | Attending: Obstetrics and Gynecology | Admitting: Obstetrics and Gynecology

## 2022-09-22 ENCOUNTER — Ambulatory Visit (INDEPENDENT_AMBULATORY_CARE_PROVIDER_SITE_OTHER): Payer: BC Managed Care – PPO | Admitting: Obstetrics and Gynecology

## 2022-09-22 VITALS — BP 115/79 | HR 63 | Resp 16 | Ht 65.0 in | Wt 186.4 lb

## 2022-09-22 DIAGNOSIS — N898 Other specified noninflammatory disorders of vagina: Secondary | ICD-10-CM | POA: Insufficient documentation

## 2022-09-22 DIAGNOSIS — E7800 Pure hypercholesterolemia, unspecified: Secondary | ICD-10-CM

## 2022-09-22 DIAGNOSIS — F419 Anxiety disorder, unspecified: Secondary | ICD-10-CM

## 2022-09-22 DIAGNOSIS — Z01419 Encounter for gynecological examination (general) (routine) without abnormal findings: Secondary | ICD-10-CM | POA: Diagnosis not present

## 2022-09-22 DIAGNOSIS — R3915 Urgency of urination: Secondary | ICD-10-CM | POA: Diagnosis not present

## 2022-09-22 DIAGNOSIS — F32A Depression, unspecified: Secondary | ICD-10-CM

## 2022-09-22 DIAGNOSIS — Z23 Encounter for immunization: Secondary | ICD-10-CM

## 2022-09-22 DIAGNOSIS — N943 Premenstrual tension syndrome: Secondary | ICD-10-CM | POA: Diagnosis not present

## 2022-09-22 LAB — POCT URINALYSIS DIPSTICK
Bilirubin, UA: NEGATIVE
Blood, UA: NEGATIVE
Glucose, UA: NEGATIVE
Ketones, UA: NEGATIVE
Leukocytes, UA: NEGATIVE
Nitrite, UA: NEGATIVE
Protein, UA: NEGATIVE
Spec Grav, UA: 1.015 (ref 1.010–1.025)
Urobilinogen, UA: 0.2 E.U./dL
pH, UA: 6.5 (ref 5.0–8.0)

## 2022-09-22 MED ORDER — TRAMADOL HCL 50 MG PO TABS: 50.0000 mg | ORAL_TABLET | Freq: Four times a day (QID) | ORAL | 1 refills | Status: DC | PRN

## 2022-09-22 MED ORDER — TRANEXAMIC ACID 650 MG PO TABS: 1300.0000 mg | ORAL_TABLET | Freq: Three times a day (TID) | ORAL | 2 refills | Status: DC

## 2022-09-22 NOTE — Patient Instructions (Signed)
Breast Self-Awareness Breast self-awareness is knowing how your breasts look and feel. You need to: Check your breasts on a regular basis. Tell your doctor about any changes. Become familiar with the look and feel of your breasts. This can help you catch a breast problem while it is still small and can be treated. You should do breast self-exams even if you have breast implants. What you need: A mirror. A well-lit room. A pillow or other soft object. How to do a breast self-exam Follow these steps to do a breast self-exam: Look for changes  Take off all the clothes above your waist. Stand in front of a mirror in a room with good lighting. Put your hands down at your sides. Compare your breasts in the mirror. Look for any difference between them, such as: A difference in shape. A difference in size. Wrinkles, dips, and bumps in one breast and not the other. Look at each breast for changes in the skin, such as: Redness. Scaly areas. Skin that has gotten thicker. Dimpling. Open sores (ulcers). Look for changes in your nipples, such as: Fluid coming out of a nipple. Fluid around a nipple. Bleeding. Dimpling. Redness. A nipple that looks pushed in (retracted), or that has changed position. Feel for changes Lie on your back. Feel each breast. To do this: Pick a breast to feel. Place a pillow under the shoulder closest to that breast. Put the arm closest to that breast behind your head. Feel the nipple area of that breast using the hand of your other arm. Feel the area with the pads of your three middle fingers by making small circles with your fingers. Use light, medium, and firm pressure. Continue the overlapping circles, moving downward over the breast. Keep making circles with your fingers. Stop when you feel your ribs. Start making circles with your fingers again, this time going upward until you reach your collarbone. Then, make circles outward across your breast and into your  armpit area. Squeeze your nipple. Check for discharge and lumps. Repeat these steps to check your other breast. Sit or stand in the tub or shower. With soapy water on your skin, feel each breast the same way you did when you were lying down. Write down what you find Writing down what you find can help you remember what to tell your doctor. Write down: What is normal for each breast. Any changes you find in each breast. These include: The kind of changes you find. A tender or painful breast. Any lump you find. Write down its size and where it is. When you last had your monthly period (menstrual cycle). General tips If you are breastfeeding, the best time to check your breasts is after you feed your baby or after you use a breast pump. If you get monthly bleeding, the best time to check your breasts is 5-7 days after your monthly cycle ends. With time, you will become comfortable with the self-exam. You will also start to know if there are changes in your breasts. Contact a doctor if: You see a change in the shape or size of your breasts or nipples. You see a change in the skin of your breast or nipples, such as red or scaly skin. You have fluid coming from your nipples that is not normal. You find a new lump or thick area. You have breast pain. You have any concerns about your breast health. Summary Breast self-awareness includes looking for changes in your breasts and feeling for changes   yoga, or listening to music. Journaling. Talking to a trusted person. Spending time with friends and family. Minimize exposure to UV radiation to reduce your risk of skin  cancer. Safety Always wear your seat belt while driving or riding in a vehicle. Do not drive: If you have been drinking alcohol. Do not ride with someone who has been drinking. If you have been using any mind-altering substances or drugs. While texting. When you are tired or distracted. Wear a helmet and other protective equipment during sports activities. If you have firearms in your house, make sure you follow all gun safety procedures. Seek help if you have been physically or sexually abused. What's next? Go to your health care provider once a year for an annual wellness visit. Ask your health care provider how often you should have your eyes and teeth checked. Stay up to date on all vaccines. This information is not intended to replace advice given to you by your health care provider. Make sure you discuss any questions you have with your health care provider. Document Revised: 03/31/2021 Document Reviewed: 03/31/2021 Elsevier Patient Education  2023 Elsevier Inc.    night with fluoride toothpaste. Floss one time each day. Exercise for at least 30 minutes 5 or more days each week. Do not use any products that contain nicotine or tobacco. These products include cigarettes, chewing tobacco, and vaping devices, such as e-cigarettes. If you need help quitting, ask your health care provider. Do not use drugs. If you are sexually active, practice safe sex. Use a condom or other form of protection to prevent STIs. If you do not wish to become pregnant, use a form of birth control. If you plan to become pregnant, see your health care provider for a prepregnancy visit. Find healthy ways to manage stress, such as: Meditation, yoga, or  listening to music. Journaling. Talking to a trusted person. Spending time with friends and family. Minimize exposure to UV radiation to reduce your risk of skin cancer. Safety Always wear your seat belt while driving or riding in a vehicle. Do not drive: If you have been drinking alcohol. Do not ride with someone who has been drinking. If you have been using any mind-altering substances or drugs. While texting. When you are tired or distracted. Wear a helmet and other protective equipment during sports activities. If you have firearms in your house, make sure you follow all gun safety procedures. Seek help if you have been physically or sexually abused. What's next? Go to your health care provider once a year for an annual wellness visit. Ask your health care provider how often you should have your eyes and teeth checked. Stay up to date on all vaccines. This information is not intended to replace advice given to you by your health care provider. Make sure you discuss any questions you have with your health care provider. Document Revised: 03/31/2021 Document Reviewed: 03/31/2021 Elsevier Patient Education  2023 Elsevier Inc.  

## 2022-09-22 NOTE — Progress Notes (Signed)
GYNECOLOGY ANNUAL PHYSICAL EXAM PROGRESS NOTE  Subjective:    Andrea Roberson is a 36 y.o. G51P2002 female who presents for an annual exam. . The patient is sexually active. The patient participates in regular exercise: yes. Has the patient ever been transfused or tattooed?: yes. The patient reports that there is not domestic violence in her life.   The patient has no complaints today:  1) Noting PMS symptoms of migraines, moderate cramping several days up to 1 week prior to onset of her cycle.  Usually takes Ibuprofen but notes that this usually only provides mild relief. Reports that last cycle she had such intense pain that she had to go to the Emergency Room. Notes a workup without any findings on exam or imaging. Was treated with Morphine and Tramadol which did help. Takes sumatriptan for migraines. Cycles are typically heavy.  2) Patient reports being evaluated for possible UTI in October, no urinary findings noted, but was positive for BV. Notes that she is not sure if her infection is gone as she is still noting some residual symptoms.   Menstrual History: Menarche age: 37 Patient's last menstrual period was 09/01/2022 (approximate).  Period Duration (Days): 6-7 Period Pattern: Regular Menstrual Flow: Heavy Menstrual Control: Tampon Menstrual Control Change Freq (Hours): 2-3 Dysmenorrhea: (!) Severe Dysmenorrhea Symptoms: Cramping, Nausea, Diarrhea, Headache    Gynecologic History:  Contraception: tubal ligation History of STI's: Denies Last Pap: 04/08/2020. Results were: normal.  Denies h/o abnormal pap smears.   OB History  Gravida Para Term Preterm AB Living  2 2 2  0 0 2  SAB IAB Ectopic Multiple Live Births  0 0 0 0 2    # Outcome Date GA Lbr Len/2nd Weight Sex Delivery Anes PTL Lv  2 Term 11/19/18 [redacted]w[redacted]d  7 lb 6.9 oz (3.37 kg) F CS-LTranv Spinal  LIV     Name: Andrea Roberson,GIRL Andrea Roberson     Apgar1: 8  Apgar5: 8  1 Term 05/02/14 [redacted]w[redacted]d  7 lb 8 oz (3.402 kg) M  CS-LTranv Spinal, EPI N LIV     Complications: Gestational diabetes mellitus in childbirth, insulin controlled, Excessive weight gain in pregnancy     Name: Andrea Roberson     Apgar1: 8  Apgar5: 9    Past Medical History:  Diagnosis Date   Anxiety    Asthma    Bipolar disorder (HCC)    Depression    Frequent headaches    GERD (gastroesophageal reflux disease)    Gestational diabetes    History of gestational diabetes 2016   Hyperlipidemia    Migraines    Obesity (BMI 35.0-39.9 without comorbidity) 12/24/2015   Tarsal coalition of left foot    TMJ (dislocation of temporomandibular joint)    UTI (urinary tract infection)    Wears contact lenses     Past Surgical History:  Procedure Laterality Date   BREAST BIOPSY Left 03/03/2022   stereo bx of left breast asymmetry, ribbon marker, neg   BREAST BIOPSY Left 03/03/2022   03/05/2022 bx of left axilla LN, NO MARKER PLACED, neg   CESAREAN SECTION  2015   CESAREAN SECTION WITH BILATERAL TUBAL LIGATION N/A 11/19/2018   Procedure: CESAREAN SECTION WITH BILATERAL TUBAL LIGATION;  Surgeon: 01/18/2019, MD;  Location: [facility] ORS;  Service: Obstetrics;  Laterality: N/A;  TOB Hildred Laser APGAR 8/8     COLONOSCOPY WITH PROPOFOL N/A 01/07/2022   Procedure: COLONOSCOPY WITH PROPOFOL;  Surgeon: 01/09/2022, MD;  Location: [facility];  Service: Gastroenterology;  Laterality: N/A;   ESOPHAGOGASTRODUODENOSCOPY (EGD) WITH PROPOFOL N/A 08/30/2017   Procedure: ESOPHAGOGASTRODUODENOSCOPY (EGD) WITH PROPOFOL;  Surgeon: Toney Reil, MD;  Location: [facility];  Service: Endoscopy;  Laterality: N/A;   TUBAL LIGATION     WISDOM TOOTH EXTRACTION      Family History  Problem Relation Age of Onset   Heart disease Mother    Diabetes Mother    Anxiety disorder Mother    Arthritis Mother    Skin cancer Mother    Diabetes Father    Skin cancer Father    Arthritis Father    Colon polyps Father    Peptic Ulcer Disease Father    Colon polyps Brother     Heart disease Maternal Grandmother    Colon cancer Paternal Grandfather    Colon cancer Paternal Aunt    Thyroid cancer Neg Hx    Breast cancer Neg Hx     Social History   Socioeconomic History   Marital status: Married    Spouse name: Not on file   Number of children: Not on file   Years of education: Not on file   Highest education level: Not on file  Occupational History   Not on file  Tobacco Use   Smoking status: Former    Packs/day: 1.50    Years: 8.00    Total pack years: 12.00    Types: Cigarettes    Quit date: 09/16/2012    Years since quitting: 10.0   Smokeless tobacco: Never  Vaping Use   Vaping Use: Former  Substance and Sexual Activity   Alcohol use: Not Currently    Comment: 2 glasswine/month   Drug use: No   Sexual activity: Yes    Birth control/protection: None, Surgical    Comment: tubal  Other Topics Concern   Not on file  Social History Narrative   Married   5 yo son , 5 month daughter   [employer], broadcasting/film/video- pre K   Right handed      Social Determinants of Health   Financial Resource Strain: Not on file  Food Insecurity: Not on file  Transportation Needs: Not on file  Physical Activity: Not on file  Stress: Not on file  Social Connections: Not on file  Intimate Partner Violence: Not on file    Current Outpatient Medications on File Prior to Visit  Medication Sig Dispense Refill   buPROPion (WELLBUTRIN XL) 150 MG 24 hr tablet Take 1 tablet (150 mg total) by mouth daily with breakfast. 90 tablet 1   cetirizine (ZYRTEC) 10 MG tablet Take 10 mg by mouth daily as needed for allergies.      diclofenac (VOLTAREN) 75 MG EC tablet Take 1 tablet (75 mg total) by mouth 2 (two) times daily. 60 tablet 3   FLUoxetine (PROZAC) 40 MG capsule Take 1 capsule (40 mg total) by mouth daily. 90 capsule 3   fluticasone (FLONASE) 50 MCG/ACT nasal spray Place 2 sprays into both nostrils daily as needed for allergies.      hydrOXYzine (ATARAX/VISTARIL) 25 MG tablet Take  0.5-1 tablets (12.5-25 mg total) by mouth 2 (two) times daily as needed. For anxiety, sleep 60 tablet 1   ipratropium (ATROVENT) 0.06 % nasal spray Place 2 sprays into both nostrils 4 (four) times daily. 15 mL 12   meloxicam (MOBIC) 15 MG tablet Take 1 tablet (15 mg total) by mouth daily. 30 tablet 3   metFORMIN (GLUCOPHAGE) 500 MG tablet TAKE 1 TABLET BY MOUTH TWICE DAILY 180  tablet 3   methylPREDNISolone (MEDROL DOSEPAK) 4 MG TBPK tablet 6 day dose pack - take as directed 21 tablet 0   metroNIDAZOLE (FLAGYL) 500 MG tablet Take 1 tablet (500 mg total) by mouth 2 (two) times daily. 14 tablet 0   minoxidil (ROGAINE WOMENS) 2 % external solution Apply topically 2 (two) times daily.     mometasone (ELOCON) 0.1 % lotion SMARTSIG:4 Drop(s) In Ear(s) Every Night PRN     ondansetron (ZOFRAN-ODT) 4 MG disintegrating tablet Take 1 tablet (4 mg total) by mouth every 8 (eight) hours as needed for nausea or vomiting. 20 tablet 0   pantoprazole (PROTONIX) 20 MG tablet Take 1 tablet (20 mg total) by mouth daily. 90 tablet 1   promethazine-dextromethorphan (PROMETHAZINE-DM) 6.25-15 MG/5ML syrup Take 5 mLs by mouth 4 (four) times daily as needed. 118 mL 0   propranolol ER (INDERAL LA) 60 MG 24 hr capsule Take 1 capsule (60 mg total) by mouth daily. 90 capsule 3   scopolamine (TRANSDERM-SCOP) 1 MG/3DAYS Place 1 patch (1.5 mg total) onto the skin every 3 (three) days. 10 patch 0   SUMAtriptan (IMITREX) 100 MG tablet Take 1 tablet earliest onset of migraine.  May repeat in 2 hours if headache persists or recurs.  Maximum 2 tablets in 24 hours. 10 tablet 5   topiramate (TOPAMAX) 25 MG tablet TAKE 1 TABLET(25 MG) BY MOUTH TWICE DAILY 120 tablet 2   Current Facility-Administered Medications on File Prior to Visit  Medication Dose Route Frequency Provider Last Rate Last Admin   betamethasone acetate-betamethasone sodium phosphate (CELESTONE) injection 3 mg  3 mg Intra-articular Once Felecia ShellingEvans, Brent M, DPM         Allergies  Allergen Reactions   Vyleesi [Bremelanotide] Itching, Nausea And Vomiting and Other (See Comments)    Flushing     Review of Systems Constitutional: negative for chills, fatigue, fevers and sweats Eyes: negative for irritation, redness and visual disturbance Ears, nose, mouth, throat, and face: negative for hearing loss, nasal congestion, snoring and tinnitus Respiratory: negative for asthma, cough, sputum Cardiovascular: negative for chest pain, dyspnea, exertional chest pressure/discomfort, irregular heart beat, palpitations and syncope Gastrointestinal: negative for abdominal pain, change in bowel habits, nausea and vomiting Genitourinary: positive for heavy painful menses, PMS symptoms (see HPI).  Genital lesions, sexual problems and vaginal discharge, dysuria and urinary incontinence Integument/breast: negative for breast lump, breast tenderness and nipple discharge Hematologic/lymphatic: negative for bleeding and easy bruising Musculoskeletal:negative for back pain and muscle weakness Neurological: negative for dizziness, headaches, vertigo and weakness Endocrine: negative for diabetic symptoms including polydipsia, polyuria and skin dryness Allergic/Immunologic: negative for hay fever and urticaria      Objective:  Blood pressure 115/79, pulse 63, resp. rate 16, height 5\' 5"  (1.651 m), weight 186 lb 6.4 oz (84.6 kg), last menstrual period 09/01/2022.  Body mass index is 31.02 kg/m.  General Appearance:    Alert, cooperative, no distress, appears stated age  Head:    Normocephalic, without obvious abnormality, atraumatic  Eyes:    PERRL, conjunctiva/corneas clear, EOM's intact, both eyes  Ears:    Normal external ear canals, both ears  Nose:   Nares normal, septum midline, mucosa normal, no drainage or sinus tenderness  Throat:   Lips, mucosa, and tongue normal; teeth and gums normal  Neck:   Supple, symmetrical, trachea midline, no adenopathy; thyroid: no  enlargement/tenderness/nodules; no carotid bruit or JVD  Back:     Symmetric, no curvature, ROM normal, no CVA tenderness  Lungs:     Clear to auscultation bilaterally, respirations unlabored  Chest Wall:    No tenderness or deformity   Heart:    Regular rate and rhythm, S1 and S2 normal, no murmur, rub or gallop  Breast Exam:    No tenderness, masses, or nipple abnormality  Abdomen:     Soft, non-tender, bowel sounds active all four quadrants, no masses, no organomegaly.    Genitalia:    Pelvic:external genitalia normal, vagina without lesions, discharge, or tenderness, rectovaginal septum  normal. Cervix normal in appearance, no cervical motion tenderness, no adnexal masses or tenderness.  Uterus normal size, shape, mobile, regular contours, nontender.  Rectal:    Normal external sphincter.  No hemorrhoids appreciated. Internal exam not done.   Extremities:   Extremities normal, atraumatic, no cyanosis or edema  Pulses:   2+ and symmetric all extremities  Skin:   Skin color, texture, turgor normal, no rashes or lesions  Lymph nodes:   Cervical, supraclavicular, and axillary nodes normal  Neurologic:   CNII-XII intact, normal strength, sensation and reflexes throughout   .  Labs:  Lab Results  Component Value Date   WBC 10.6 (H) 08/31/2022   HGB 12.8 08/31/2022   HCT 38.4 08/31/2022   MCV 85.3 08/31/2022   PLT 248 08/31/2022    Lab Results  Component Value Date   CREATININE 0.83 08/31/2022   BUN 14 08/31/2022   NA 133 (L) 08/31/2022   K 3.4 (L) 08/31/2022   CL 106 08/31/2022   CO2 20 (L) 08/31/2022    Lab Results  Component Value Date   ALT 28 08/31/2022   AST 24 08/31/2022   ALKPHOS 63 08/31/2022   BILITOT 0.6 08/31/2022    Lab Results  Component Value Date   TSH 1.670 04/08/2021   Lab Results  Component Value Date   CHOL 222 (H) 04/08/2021   HDL 33 (L) 04/08/2021   LDLCALC 101 (H) 04/08/2021   TRIG 517 (H) 04/08/2021   CHOLHDL 6.7 (H) 04/08/2021    Lab  Results  Component Value Date   HGBA1C 5.1 01/26/2022     Results for orders placed or performed in visit on 09/22/22  POCT Urinalysis Dipstick  Result Value Ref Range   Color, UA     Clarity, UA     Glucose, UA Negative Negative   Bilirubin, UA Negative    Ketones, UA Negative    Spec Grav, UA 1.015 1.010 - 1.025   Blood, UA Negative    pH, UA 6.5 5.0 - 8.0   Protein, UA Negative Negative   Urobilinogen, UA 0.2 0.2 or 1.0 E.U./dL   Nitrite, UA Negative    Leukocytes, UA Negative Negative   Appearance     Odor      Assessment:   1. Well woman exam with routine gynecological exam   2. Vaginal odor   3. Urinary urgency   4. PMS (premenstrual syndrome)   5. Need for influenza vaccination   6. Anxiety and depression   7. Elevated cholesterol      Plan:  - Blood tests: None ordered. Labs up to date.  - Breast self exam technique reviewed and patient encouraged to perform self-exam monthly. - Contraception: tubal ligation. - Discussed healthy lifestyle modifications. - Mammogram  : not age appropriate - Pap smear  up to date . - Flu vaccine: given today.  - Anxiety and depression managed by PCP, currently on Wellbutrin and Prozac.  - Patient with PMS syndrome.  Has concerns that symptoms will be severe again and will be heading out of town.  Will give treatment of TXA and Tramdol for upcoming cycle as she will be traveling out of town and cycle will be due soon. Will assess for other long-term options for management when she returns.  - Dysipdemia managed by PCP.  - UA today negative. Repeat vaginal culture performed for residual symptoms.  - Follow up in 1 year for annual exam   Hildred Laser, MD [facility]

## 2022-09-26 LAB — CERVICOVAGINAL ANCILLARY ONLY
Bacterial Vaginitis (gardnerella): NEGATIVE
Comment: NEGATIVE

## 2022-09-28 ENCOUNTER — Encounter: Payer: Self-pay | Admitting: Obstetrics and Gynecology

## 2022-10-07 ENCOUNTER — Encounter: Payer: Self-pay | Admitting: Gastroenterology

## 2022-10-13 ENCOUNTER — Telehealth: Payer: Self-pay

## 2022-10-13 ENCOUNTER — Other Ambulatory Visit: Payer: Self-pay | Admitting: Family

## 2022-10-13 DIAGNOSIS — E669 Obesity, unspecified: Secondary | ICD-10-CM

## 2022-10-13 NOTE — Telephone Encounter (Signed)
Pharmacy Patient Advocate Encounter  Received notification from [facility] that the request for prior authorization for Saxenda 18mg /80ml has been denied due to .   Please advise

## 2022-10-13 NOTE — Telephone Encounter (Signed)
Pharmacy Patient Advocate Encounter   Received notification from [facility] that prior authorization for [facility] 0.25MG /0.5ML is required/requested.    PA submitted on 12.28.23 to (ins) [employer] via CoverMyMeds Key [id number] Status is pending

## 2022-10-13 NOTE — Telephone Encounter (Signed)
Spoke to pt and informed her that wegovy was approved

## 2022-10-13 NOTE — Telephone Encounter (Signed)
Pharmacy Patient Advocate Encounter  Prior Authorization for [facility] 0.25/0.42ml has been approved.    Key# [id number]  Effective dates: 12.28.23 through 12.28.24  Spoke with Pharmacy to process.

## 2022-10-13 NOTE — Telephone Encounter (Signed)
Pharmacy Patient Advocate Encounter   Received notification from [facility] that prior authorization for Saxenda 18MG /3ML is required/requested.     PA submitted on 12.28.23 to (ins) [employer]  via CoverMyMeds Key  [id number] Status is pending

## 2022-10-13 NOTE — Telephone Encounter (Signed)
Spoke to pt and informed her that her saxenda was denied but wegovy was approved

## 2022-10-14 NOTE — Addendum Note (Signed)
Addended by: Adela Ports on: 10/14/2022 04:06 PM   Modules accepted: Orders

## 2022-10-25 ENCOUNTER — Other Ambulatory Visit: Payer: Self-pay | Admitting: Family

## 2022-10-25 ENCOUNTER — Other Ambulatory Visit: Payer: Self-pay | Admitting: Podiatry

## 2022-10-25 DIAGNOSIS — G43109 Migraine with aura, not intractable, without status migrainosus: Secondary | ICD-10-CM

## 2022-10-25 NOTE — Telephone Encounter (Signed)
I never prescribed the metformin.  Prescription was for meloxicam.  Please clarify with the patient if she is requesting a refill of meloxicam or metformin.  If this is metformin it needs to be refilled from her PCP.  Thanks, Dr. Amalia Hailey

## 2022-10-26 NOTE — Telephone Encounter (Signed)
Spoke with the patient ,did prescribe 10/22 but by accident,pharmacy sent  the request in error as well.

## 2022-10-31 ENCOUNTER — Other Ambulatory Visit: Payer: Self-pay | Admitting: Obstetrics and Gynecology

## 2022-10-31 MED ORDER — NORETHINDRONE 0.35 MG PO TABS: 1.0000 | ORAL_TABLET | Freq: Every day | ORAL | 3 refills | Status: DC

## 2022-12-13 ENCOUNTER — Encounter: Payer: Self-pay | Admitting: Family

## 2023-01-20 ENCOUNTER — Ambulatory Visit: Payer: BC Managed Care – PPO | Admitting: Podiatry

## 2023-01-20 DIAGNOSIS — M19072 Primary osteoarthritis, left ankle and foot: Secondary | ICD-10-CM

## 2023-01-20 NOTE — Progress Notes (Signed)
Chief Complaint  Patient presents with   Injections    Patient came in today for left foot ankle pain,on the lateral side of the ankle, rate of pain 4 out of 10,  patient would like and injection today     HPI: Patient presenting for follow-up evaluation of tarsal coalition and left foot and ankle pain.  Patient was last seen in the office 07/19/2022.  She comes in occasionally for cortisone injections to the sinus tarsi which seem to help for several months.  Patient states over the past 6 to 7 months she has had gradual onset of recurrence of the ankle pain.  She does take meloxicam daily.  She continues to state that she does not want any surgical intervention and would like to continue conservative care.  She wears orthotics daily  Past Medical History:  Diagnosis Date   Anxiety    Asthma    Bipolar disorder (HCC)    Depression    Frequent headaches    GERD (gastroesophageal reflux disease)    Gestational diabetes    History of gestational diabetes 2016   Hyperlipidemia    Migraines    Obesity (BMI 35.0-39.9 without comorbidity) 12/24/2015   Tarsal coalition of left foot    TMJ (dislocation of temporomandibular joint)    UTI (urinary tract infection)    Wears contact lenses    Past Surgical History:  Procedure Laterality Date   BREAST BIOPSY Left 03/03/2022   stereo bx of left breast asymmetry, ribbon marker, neg   BREAST BIOPSY Left 03/03/2022   [place] bx of left axilla LN, NO MARKER PLACED, neg   CESAREAN SECTION  2015   CESAREAN SECTION WITH BILATERAL TUBAL LIGATION N/A 11/19/2018   Procedure: CESAREAN SECTION WITH BILATERAL TUBAL LIGATION;  Surgeon: Hildred Laser, MD;  Location: [facility] ORS;  Service: Obstetrics;  Laterality: N/A;  TOB 1610 APGAR 8/8     COLONOSCOPY WITH PROPOFOL N/A 01/07/2022   Procedure: COLONOSCOPY WITH PROPOFOL;  Surgeon: Toney Reil, MD;  Location: [facility] ENDOSCOPY;  Service: Gastroenterology;  Laterality: N/A;   ESOPHAGOGASTRODUODENOSCOPY (EGD)  WITH PROPOFOL N/A 08/30/2017   Procedure: ESOPHAGOGASTRODUODENOSCOPY (EGD) WITH PROPOFOL;  Surgeon: Toney Reil, MD;  Location: [facility];  Service: Endoscopy;  Laterality: N/A;   TUBAL LIGATION     WISDOM TOOTH EXTRACTION     Allergies  Allergen Reactions   Vyleesi [Bremelanotide] Itching, Nausea And Vomiting and Other (See Comments)    Flushing    Physical Exam: General: The patient is alert and oriented x3 in no acute distress.  Dermatology: Skin is warm, dry and supple bilateral lower extremities. Negative for open lesions or macerations.  Vascular: Palpable pedal pulses bilaterally. No edema or erythema noted. Capillary refill within normal limits.  Neurological: Epicritic and protective threshold grossly intact bilaterally.   Musculoskeletal Exam: Limited ROM with inversion and eversion of the subtalar joint. Muscle strength 5/5 in all groups bilateral.  There is some pain with inversion and eversion as well as pain on palpation of the sinus tarsi left.  CT FOOT LEFT WO CONTRAST 05/07/2019 IMPRESSION: 1. 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  -Injection of 0.5 mLs Celestone Soluspan injected into the left sinus tarsi.  -Patient seems to get better relief with the meloxicam 50 mg daily.  Continue as needed.  Refills provided.  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 22yr old son.   Felecia Shelling, DPM [facility]  Dr. Felecia Shelling, DPM    [address] [place], [place] [address]                Office [phone]  Fax [phone]

## 2023-01-24 ENCOUNTER — Encounter: Payer: Self-pay | Admitting: Podiatry

## 2023-01-30 ENCOUNTER — Ambulatory Visit: Payer: BC Managed Care – PPO | Admitting: Family

## 2023-01-30 VITALS — BP 118/78 | HR 68 | Temp 97.9°F | Ht 65.0 in | Wt 188.8 lb

## 2023-01-30 DIAGNOSIS — M19072 Primary osteoarthritis, left ankle and foot: Secondary | ICD-10-CM

## 2023-01-30 DIAGNOSIS — G43109 Migraine with aura, not intractable, without status migrainosus: Secondary | ICD-10-CM | POA: Diagnosis not present

## 2023-01-30 DIAGNOSIS — F32A Depression, unspecified: Secondary | ICD-10-CM

## 2023-01-30 DIAGNOSIS — F419 Anxiety disorder, unspecified: Secondary | ICD-10-CM

## 2023-01-30 DIAGNOSIS — F411 Generalized anxiety disorder: Secondary | ICD-10-CM

## 2023-01-30 DIAGNOSIS — E669 Obesity, unspecified: Secondary | ICD-10-CM | POA: Diagnosis not present

## 2023-01-30 MED ORDER — BETAMETHASONE SOD PHOS & ACET 6 (3-3) MG/ML IJ SUSP
3.0000 mg | Freq: Once | INTRAMUSCULAR | Status: AC
  Administered 2023-01-30: 3 mg via INTRA_ARTICULAR

## 2023-01-30 MED ORDER — PROPRANOLOL HCL ER 60 MG PO CP24: ORAL_CAPSULE | ORAL | 3 refills | Status: AC

## 2023-01-30 MED ORDER — FLUOXETINE HCL 40 MG PO CAPS: 40.0000 mg | ORAL_CAPSULE | Freq: Every day | ORAL | 3 refills | Status: DC

## 2023-01-30 MED ORDER — BUPROPION HCL ER (XL) 150 MG PO TB24: 150.0000 mg | ORAL_TABLET | Freq: Every day | ORAL | 3 refills | Status: DC

## 2023-01-30 MED ORDER — MELOXICAM 15 MG PO TABS: 15.0000 mg | ORAL_TABLET | Freq: Every day | ORAL | 1 refills | Status: DC | PRN

## 2023-01-30 MED ORDER — WEGOVY 0.25 MG/0.5ML ~~LOC~~ SOAJ: 0.2500 mg | SUBCUTANEOUS | 2 refills | Status: DC

## 2023-01-30 NOTE — Progress Notes (Unsigned)
Assessment & Plan:  Obesity (BMI 30.0-34.9) -     [id number]; Inject 0.25 mg into the skin once a week.  Dispense: 2 mL; Refill: 2  Generalized anxiety disorder -     buPROPion HCl ER (XL); Take 1 tablet (150 mg total) by mouth daily with breakfast.  Dispense: 90 tablet; Refill: 3 -     FLUoxetine HCl; Take 1 capsule (40 mg total) by mouth daily.  Dispense: 90 capsule; Refill: 3  Migraine with aura and without status migrainosus, not intractable Assessment & Plan: Chronic, headaches remain well controlled. Continue propranolol 60 mg ER, Imitrex 100 mg as needed. She is also on topamax  which was started in the setting of headaches, desire to lose weight.  We discussed potentially discontinuing  Topamax as not sure if such a low dose if it is affecting either.  Will discuss at follow-up  Orders: -     Propranolol HCl ER; TAKE 1 CAPSULE(60 MG) BY MOUTH DAILY  Dispense: 90 capsule; Refill: 3  Anxiety and depression Assessment & Plan: Chronic, stable.  Continue Prozac 40 mg, wellbutrin 150 mg.    Other orders -     Meloxicam; Take 1 tablet (15 mg total) by mouth daily as needed for pain.  Dispense: 30 tablet; Refill: 1     Return precautions given.   Risks, benefits, and alternatives of the medications and treatment plan prescribed today were discussed, and patient expressed understanding.   Education regarding symptom management and diagnosis given to patient on AVS either electronically or printed.  Return in about 4 months (around 06/01/2023).  Rennie Plowman, FNP  Subjective:    Patient ID: Andrea Roberson, female    DOB: [birth year], 37 y.o.   MRN: [id number]  CC: Andrea Roberson is a 37 y.o. female who presents today for follow up.   HPI: Frustrated by weight.  She would like to try weight loss medication.   Previously tried Saxenda, metformin, phentermine, wegovy, topamax, and mounjaro History of prediabetes  She has no family or personal history of thyroid  cancer, MEN She is doing well on Prozac, Wellbutrin.  She request refills today.   Allergies: Vyleesi [bremelanotide] Current Outpatient Medications on File Prior to Visit  Medication Sig Dispense Refill   cetirizine (ZYRTEC) 10 MG tablet Take 10 mg by mouth daily as needed for allergies.      fluticasone (FLONASE) 50 MCG/ACT nasal spray Place 2 sprays into both nostrils daily as needed for allergies.      hydrOXYzine (ATARAX/VISTARIL) 25 MG tablet Take 0.5-1 tablets (12.5-25 mg total) by mouth 2 (two) times daily as needed. For anxiety, sleep 60 tablet 1   ipratropium (ATROVENT) 0.06 % nasal spray Place 2 sprays into both nostrils 4 (four) times daily. 15 mL 12   metFORMIN (GLUCOPHAGE) 500 MG tablet TAKE 1 TABLET BY MOUTH TWICE DAILY 180 tablet 3   minoxidil (LONITEN) 2.5 MG tablet Take 2.5 mg by mouth daily. Pt is taking 1/2 tablet 2 times daily     norethindrone (MICRONOR) 0.35 MG tablet Take 1 tablet (0.35 mg total) by mouth daily. 84 tablet 3   pantoprazole (PROTONIX) 20 MG tablet Take 1 tablet (20 mg total) by mouth daily. 90 tablet 1   SUMAtriptan (IMITREX) 100 MG tablet Take 1 tablet earliest onset of migraine.  May repeat in 2 hours if headache persists or recurs.  Maximum 2 tablets in 24 hours. 10 tablet 5   topiramate (TOPAMAX) 25 MG tablet TAKE 1  TABLET(25 MG) BY MOUTH TWICE DAILY 120 tablet 2   traMADol (ULTRAM) 50 MG tablet Take 1-2 tablets (50-100 mg total) by mouth every 6 (six) hours as needed for moderate pain. 30 tablet 1   Current Facility-Administered Medications on File Prior to Visit  Medication Dose Route Frequency Provider Last Rate Last Admin   betamethasone acetate-betamethasone sodium phosphate (CELESTONE) injection 3 mg  3 mg Intra-articular Once Felecia Shelling, DPM        Review of Systems  Constitutional:  Negative for chills and fever.  Respiratory:  Negative for cough.   Cardiovascular:  Negative for chest pain and palpitations.  Gastrointestinal:  Negative  for nausea and vomiting.      Objective:    BP 118/78   Pulse 68   Temp 97.9 F (36.6 C) (Oral)   Ht 5\' 5"  (1.651 m)   Wt 188 lb 12.8 oz (85.6 kg)   LMP  (LMP Unknown)   SpO2 98%   BMI 31.42 kg/m  BP Readings from Last 3 Encounters:  01/30/23 118/78  09/22/22 115/79  09/01/22 119/80   Wt Readings from Last 3 Encounters:  01/30/23 188 lb 12.8 oz (85.6 kg)  09/22/22 186 lb 6.4 oz (84.6 kg)  09/01/22 186 lb (84.4 kg)    Physical Exam Vitals reviewed.  Constitutional:      Appearance: She is well-developed.  Eyes:     Conjunctiva/sclera: Conjunctivae normal.  Cardiovascular:     Rate and Rhythm: Normal rate and regular rhythm.     Pulses: Normal pulses.     Heart sounds: Normal heart sounds.  Pulmonary:     Effort: Pulmonary effort is normal.     Breath sounds: Normal breath sounds. No wheezing, rhonchi or rales.  Skin:    General: Skin is warm and dry.  Neurological:     Mental Status: She is alert.  Psychiatric:        Speech: Speech normal.        Behavior: Behavior normal.        Thought Content: Thought content normal.

## 2023-01-30 NOTE — Patient Instructions (Signed)
We have discussed starting non insulin daily injectable medication called Wegovy  which is a glucagon like peptide (GLP 1) agonist and works by delaying gastric emptying and increasing insulin secretion.It is given once per week. Most patients see significant weight loss with this drug class.   You may NOT take either medication if you or your family has history of thyroid, parathyroid, OR adrenal cancer. Please confirm you and your family does NOT have this history as this drug class has black box warning on this medication for that reason.   Please follow  directions on prescription and slowly increase from 0.25mg [place] once per week ;stay here for 4 weeks. You may then increase to 0.5mg [place] once per week and stay there for 4 weeks.  We can slowly titrate further at follow up with goal of no more than 1-2 lbs weight loss per week.  Semaglutide (Wegovy)  Dose (mg) Once Weekly Titration:    If a dose is not tolerated, consider delaying further dose increases for  another 4 weeks.  If you are actively losing weight on a dose, do not increase medication.   Weeks 1 through 4  0.25 mg once weekly  Weeks 5 through 8  0.5 mg once weekly  Weeks 9 though 12  1 mg once weekly  Weeks 13 through 16  1.7 mg once weekly   Semaglutide Injection (Weight Management) What is this medication? SEMAGLUTIDE (SEM a GLOO tide) promotes weight loss. It may also be used to maintain weight loss. It works by decreasing appetite. Changes to diet and exercise are often combined with this medication. This medicine may be used for other purposes; ask your health care provider or pharmacist if you have questions. COMMON BRAND NAME(S): Wegovy What should I tell my care team before I take this medication? They need to know if you have any of these conditions: Endocrine tumors (MEN 2) or if someone in your family had these tumors Eye disease, vision problems Gallbladder disease History of depression or mental health  disease History of pancreatitis Kidney disease Stomach or intestine problems Suicidal thoughts, plans, or attempt; a previous suicide attempt by you or a family member Thyroid cancer or if someone in your family had thyroid cancer An unusual or allergic reaction to semaglutide, other medications, foods, dyes, or preservatives Pregnant or trying to get pregnant Breast-feeding How should I use this medication? This medication is injected under the skin. You will be taught how to prepare and give it. Take it as directed on the prescription label. It is given once every week (every 7 days). Keep taking it unless your care team tells you to stop. It is important that you put your used needles and pens in a special sharps container. Do not put them in a trash can. If you do not have a sharps container, call your pharmacist or care team to get one. A special MedGuide will be given to you by the pharmacist with each prescription and refill. Be sure to read this information carefully each time. This medication comes with INSTRUCTIONS FOR USE. Ask your pharmacist for directions on how to use this medication. Read the information carefully. Talk to your pharmacist or care team if you have questions. Talk to your care team about the use of this medication in children. While it may be prescribed for children as young as 12 years for selected conditions, precautions do apply. Overdosage: If you think you have taken too much of this medicine contact a   poison control center or emergency room at once. NOTE: This medicine is only for you. Do not share this medicine with others. What if I miss a dose? If you miss a dose and the next scheduled dose is more than 2 days away, take the missed dose as soon as possible. If you miss a dose and the next scheduled dose is less than 2 days away, do not take the missed dose. Take the next dose at your regular time. Do not take double or extra doses. If you miss your dose for 2  weeks or more, take the next dose at your regular time or call your care team to talk about how to restart this medication. What may interact with this medication? Insulin and other medications for diabetes This list may not describe all possible interactions. Give your health care provider a list of all the medicines, herbs, non-prescription drugs, or dietary supplements you use. Also tell them if you smoke, drink alcohol, or use illegal drugs. Some items may interact with your medicine. What should I watch for while using this medication? Visit your care team for regular checks on your progress. It may be some time before you see the benefit from this medication. Drink plenty of fluids while taking this medication. Check with your care team if you have severe diarrhea, nausea, and vomiting, or if you sweat a lot. The loss of too much body fluid may make it dangerous for you to take this medication. This medication may affect blood sugar levels. Ask your care team if changes in diet or medications are needed if you have diabetes. If you or your family notice any changes in your behavior, such as new or worsening depression, thoughts of harming yourself, anxiety, other unusual or disturbing thoughts, or memory loss, call your care team right away. Women should inform their care team if they wish to become pregnant or think they might be pregnant. Losing weight while pregnant is not advised and may cause harm to the unborn child. Talk to your care team for more information. What side effects may I notice from receiving this medication? Side effects that you should report to your care team as soon as possible: Allergic reactions--skin rash, itching, hives, swelling of the face, lips, tongue, or throat Change in vision Dehydration--increased thirst, dry mouth, feeling faint or lightheaded, headache, dark yellow or brown urine Gallbladder problems--severe stomach pain, nausea, vomiting, fever Heart  palpitations--rapid, pounding, or irregular heartbeat Kidney injury--decrease in the amount of urine, swelling of the ankles, hands, or feet Pancreatitis--severe stomach pain that spreads to your back or gets worse after eating or when touched, fever, nausea, vomiting Thoughts of suicide or self-harm, worsening mood, feelings of depression Thyroid cancer--new mass or lump in the neck, pain or trouble swallowing, trouble breathing, hoarseness Side effects that usually do not require medical attention (report to your care team if they continue or are bothersome): Diarrhea Loss of appetite Nausea Stomach pain Vomiting This list may not describe all possible side effects. Call your doctor for medical advice about side effects. You may report side effects to FDA at 1-800-FDA-1088. Where should I keep my medication? Keep out of the reach of children and pets. Refrigeration (preferred): Store in the refrigerator. Do not freeze. Keep this medication in the original container until you are ready to take it. Get rid of any unused medication after the expiration date. Room temperature: If needed, prior to cap removal, the pen can be stored at room temperature   for up to 28 days. Protect from light. If it is stored at room temperature, get rid of any unused medication after 28 days or after it expires, whichever is first. It is important to get rid of the medication as soon as you no longer need it or it is expired. You can do this in two ways: Take the medication to a medication take-back program. Check with your pharmacy or law enforcement to find a location. If you cannot return the medication, follow the directions in the MedGuide. NOTE: This sheet is a summary. It may not cover all possible information. If you have questions about this medicine, talk to your doctor, pharmacist, or health care provider.  2023 Elsevier/Gold Standard (2020-12-17 00:00:00)  

## 2023-01-31 ENCOUNTER — Encounter: Payer: Self-pay | Admitting: Family

## 2023-01-31 NOTE — Assessment & Plan Note (Addendum)
Chronic, headaches remain well controlled. Continue propranolol 60 mg ER, Imitrex 100 mg as needed. She is also on topamax  which was started in the setting of headaches, desire to lose weight.  We discussed potentially discontinuing  Topamax as not sure if such a low dose if it is affecting either.  Will discuss at follow-up

## 2023-01-31 NOTE — Assessment & Plan Note (Signed)
Chronic, stable.  Continue Prozac 40 mg, wellbutrin 150 mg.

## 2023-02-01 ENCOUNTER — Encounter: Payer: Self-pay | Admitting: Family

## 2023-02-03 ENCOUNTER — Other Ambulatory Visit: Payer: Self-pay

## 2023-02-03 ENCOUNTER — Other Ambulatory Visit: Payer: Self-pay | Admitting: Family

## 2023-02-03 MED ORDER — OZEMPIC (0.25 OR 0.5 MG/DOSE) 2 MG/1.5ML ~~LOC~~ SOPN: 0.2500 mg | PEN_INJECTOR | SUBCUTANEOUS | 3 refills | Status: DC

## 2023-02-03 MED ORDER — METFORMIN HCL 500 MG PO TABS: ORAL_TABLET | ORAL | 3 refills | Status: DC

## 2023-02-20 ENCOUNTER — Encounter: Payer: Self-pay | Admitting: Family

## 2023-04-05 ENCOUNTER — Encounter: Payer: Self-pay | Admitting: Family

## 2023-04-07 ENCOUNTER — Other Ambulatory Visit: Payer: Self-pay | Admitting: Family

## 2023-04-07 DIAGNOSIS — E669 Obesity, unspecified: Secondary | ICD-10-CM

## 2023-04-07 MED ORDER — OZEMPIC (1 MG/DOSE) 2 MG/1.5ML ~~LOC~~ SOPN: 1.0000 mg | PEN_INJECTOR | SUBCUTANEOUS | 3 refills | Status: DC

## 2023-04-10 ENCOUNTER — Other Ambulatory Visit: Payer: Self-pay | Admitting: Family

## 2023-06-02 ENCOUNTER — Ambulatory Visit: Payer: BC Managed Care – PPO | Admitting: Family

## 2023-07-08 ENCOUNTER — Ambulatory Visit
Admission: EM | Admit: 2023-07-08 | Discharge: 2023-07-08 | Disposition: A | Discharge status: Home / Self Care | Payer: BC Managed Care – PPO

## 2023-07-08 ENCOUNTER — Other Ambulatory Visit: Payer: Self-pay

## 2023-07-08 ENCOUNTER — Encounter: Payer: BC Managed Care – PPO | Admitting: Nurse Practitioner

## 2023-07-08 ENCOUNTER — Encounter: Payer: Self-pay | Admitting: Family

## 2023-07-08 ENCOUNTER — Emergency Department
Admission: EM | Admit: 2023-07-08 | Discharge: 2023-07-08 | Disposition: A | Discharge status: Home / Self Care | Payer: BC Managed Care – PPO | Attending: Emergency Medicine | Admitting: Emergency Medicine

## 2023-07-08 DIAGNOSIS — R82998 Other abnormal findings in urine: Secondary | ICD-10-CM

## 2023-07-08 DIAGNOSIS — R197 Diarrhea, unspecified: Secondary | ICD-10-CM | POA: Diagnosis present

## 2023-07-08 DIAGNOSIS — R1084 Generalized abdominal pain: Secondary | ICD-10-CM | POA: Diagnosis not present

## 2023-07-08 DIAGNOSIS — R195 Other fecal abnormalities: Secondary | ICD-10-CM

## 2023-07-08 DIAGNOSIS — R110 Nausea: Secondary | ICD-10-CM | POA: Diagnosis not present

## 2023-07-08 DIAGNOSIS — R000 Tachycardia, unspecified: Secondary | ICD-10-CM

## 2023-07-08 DIAGNOSIS — R109 Unspecified abdominal pain: Secondary | ICD-10-CM | POA: Insufficient documentation

## 2023-07-08 LAB — CBC
HCT: 44.8 % (ref 36.0–46.0)
Hemoglobin: 15.2 g/dL — ABNORMAL HIGH (ref 12.0–15.0)
MCH: 29.4 pg (ref 26.0–34.0)
MCHC: 33.9 g/dL (ref 30.0–36.0)
MCV: 86.7 fL (ref 80.0–100.0)
Platelets: 422 10*3/uL — ABNORMAL HIGH (ref 150–400)
RBC: 5.17 MIL/uL — ABNORMAL HIGH (ref 3.87–5.11)
RDW: 11.9 % (ref 11.5–15.5)
WBC: 12.7 10*3/uL — ABNORMAL HIGH (ref 4.0–10.5)
nRBC: 0 % (ref 0.0–0.2)

## 2023-07-08 LAB — GASTROINTESTINAL PANEL BY PCR, STOOL (REPLACES STOOL CULTURE)

## 2023-07-08 LAB — COMPREHENSIVE METABOLIC PANEL
ALT: 32 U/L (ref 0–44)
AST: 15 U/L (ref 15–41)
Albumin: 4.3 g/dL (ref 3.5–5.0)
Alkaline Phosphatase: 65 U/L (ref 38–126)
Anion gap: 9 (ref 5–15)
BUN: 21 mg/dL — ABNORMAL HIGH (ref 6–20)
CO2: 16 mmol/L — ABNORMAL LOW (ref 22–32)
Calcium: 8.9 mg/dL (ref 8.9–10.3)
Chloride: 109 mmol/L (ref 98–111)
Creatinine, Ser: 0.79 mg/dL (ref 0.44–1.00)
GFR, Estimated: >60 mL/min (ref >60)
Glucose, Bld: 84 mg/dL (ref 70–99)
Potassium: 3.6 mmol/L (ref 3.5–5.1)
Sodium: 134 mmol/L — ABNORMAL LOW (ref 135–145)
Total Bilirubin: 0.7 mg/dL (ref 0.3–1.2)
Total Protein: 8.3 g/dL — ABNORMAL HIGH (ref 6.5–8.1)

## 2023-07-08 LAB — C DIFFICILE QUICK SCREEN W PCR REFLEX
C Diff antigen: NEGATIVE
C Diff interpretation: NOT DETECTED
C Diff toxin: NEGATIVE

## 2023-07-08 LAB — URINALYSIS, ROUTINE W REFLEX MICROSCOPIC
Bilirubin Urine: NEGATIVE
Glucose, UA: NEGATIVE mg/dL
Ketones, ur: NEGATIVE mg/dL
Leukocytes,Ua: NEGATIVE
Nitrite: NEGATIVE
Protein, ur: 30 mg/dL — AB
Specific Gravity, Urine: 1.031 — ABNORMAL HIGH (ref 1.005–1.030)
pH: 5 (ref 5.0–8.0)

## 2023-07-08 LAB — LIPASE, BLOOD: Lipase: 49 U/L (ref 11–51)

## 2023-07-08 MED ORDER — KETOROLAC TROMETHAMINE 30 MG/ML IJ SOLN
30.0000 mg | Freq: Once | INTRAMUSCULAR | Status: AC
  Administered 2023-07-08: 30 mg via INTRAVENOUS
  Filled 2023-07-08: qty 1

## 2023-07-08 MED ORDER — ONDANSETRON HCL 4 MG/2ML IJ SOLN
4.0000 mg | Freq: Once | INTRAMUSCULAR | Status: AC
  Administered 2023-07-08: 4 mg via INTRAVENOUS
  Filled 2023-07-08: qty 2

## 2023-07-08 MED ORDER — SODIUM CHLORIDE 0.9 % IV SOLN: Freq: Once | INTRAVENOUS | Status: AC

## 2023-07-08 MED ORDER — PANTOPRAZOLE SODIUM 40 MG IV SOLR
40.0000 mg | Freq: Once | INTRAVENOUS | Status: AC
  Administered 2023-07-08: 40 mg via INTRAVENOUS
  Filled 2023-07-08: qty 10

## 2023-07-08 NOTE — ED Provider Notes (Signed)
Andrea Roberson    CSN: [id number] Arrival date & time: 07/08/23  1222      History   Chief Complaint Chief Complaint  Patient presents with   Diarrhea    HPI Andrea Roberson is a 37 y.o. female.  Accompanied by her husband, patient presents with 2 day history of abdominal pain, nausea, itching, diarrhea, low back pain.  Patient reports 7 episodes of diarrhea already today.  No fever or vomiting.  Treatment attempted with Pepto-Bismol, Tums, pantoprazole without relief.  Patient was seen at [facility] clinic on 06/28/2023; diagnosed with bacterial sinusitis, cough, bronchitis; treated with Augmentin, albuterol inhaler, prednisone, Tessalon Perles; COVID, flu, RSV, and strep negative; CXR negative. She discontinued the Augmentin after 8 days due to her stomach upset.   The history is provided by the patient, medical records and the spouse.    Past Medical History:  Diagnosis Date   Anxiety    Asthma    Bipolar disorder (HCC)    Depression    Frequent headaches    GERD (gastroesophageal reflux disease)    Gestational diabetes    History of gestational diabetes 2016   Hyperlipidemia    Migraines    Obesity (BMI 35.0-39.9 without comorbidity) 12/24/2015   Tarsal coalition of left foot    TMJ (dislocation of temporomandibular joint)    UTI (urinary tract infection)    Wears contact lenses     Patient Active Problem List   Diagnosis Date Noted   Mass in armpit 01/26/2022   Occult blood positive stool    Aphthous ulcer of ileum    Nausea 11/24/2021   Change in stool 11/23/2021   MDD (major depressive disorder), recurrent, in full remission (HCC) 10/06/2020   TMJ (dislocation of temporomandibular joint)    Migraines    MDD (major depressive disorder), recurrent, in partial remission (HCC) 01/28/2020   MDD (major depressive disorder), recurrent episode, mild (HCC) 06/19/2019   Bereavement 05/15/2019   Insomnia due to medical condition 05/15/2019   Elevated cholesterol  04/24/2019   Dyspepsia    Constipation 05/12/2017   Generalized anxiety disorder 01/27/2016   Obesity (BMI 30.0-34.9) 12/24/2015   Anxiety and depression 12/08/2015    Past Surgical History:  Procedure Laterality Date   BREAST BIOPSY Left 03/03/2022   stereo bx of left breast asymmetry, ribbon marker, neg   BREAST BIOPSY Left 03/03/2022   [place] bx of left axilla LN, NO MARKER PLACED, neg   CESAREAN SECTION  2015   CESAREAN SECTION WITH BILATERAL TUBAL LIGATION N/A 11/19/2018   Procedure: CESAREAN SECTION WITH BILATERAL TUBAL LIGATION;  Surgeon: Hildred Laser, MD;  Location: [facility] ORS;  Service: Obstetrics;  Laterality: N/A;  TOB 4098 APGAR 8/8     COLONOSCOPY WITH PROPOFOL N/A 01/07/2022   Procedure: COLONOSCOPY WITH PROPOFOL;  Surgeon: Toney Reil, MD;  Location: [facility] ENDOSCOPY;  Service: Gastroenterology;  Laterality: N/A;   ESOPHAGOGASTRODUODENOSCOPY (EGD) WITH PROPOFOL N/A 08/30/2017   Procedure: ESOPHAGOGASTRODUODENOSCOPY (EGD) WITH PROPOFOL;  Surgeon: Toney Reil, MD;  Location: [facility];  Service: Endoscopy;  Laterality: N/A;   TUBAL LIGATION     WISDOM TOOTH EXTRACTION      OB History     Gravida  2   Para  2   Term  2   Preterm      AB      Living  2      SAB      IAB      Ectopic  Multiple  0   Live Births  2            Home Medications    Prior to Admission medications   Medication Sig Start Date End Date Taking? Authorizing Provider  buPROPion (WELLBUTRIN XL) 150 MG 24 hr tablet Take 1 tablet (150 mg total) by mouth daily with breakfast. 01/30/23   Allegra Grana, FNP  cetirizine (ZYRTEC) 10 MG tablet Take 10 mg by mouth daily as needed for allergies.     [provider]  FLUoxetine (PROZAC) 40 MG capsule Take 1 capsule (40 mg total) by mouth daily. 01/30/23   Allegra Grana, FNP  fluticasone (FLONASE) 50 MCG/ACT nasal spray Place 2 sprays into both nostrils daily as needed for allergies.      [provider]  hydrOXYzine (ATARAX/VISTARIL) 25 MG tablet Take 0.5-1 tablets (12.5-25 mg total) by mouth 2 (two) times daily as needed. For anxiety, sleep 09/05/19   Jomarie Longs, MD  ipratropium (ATROVENT) 0.06 % nasal spray Place 2 sprays into both nostrils 4 (four) times daily. 02/20/22   Becky Augusta, NP  meloxicam (MOBIC) 15 MG tablet TAKE 1 TABLET(15 MG) BY MOUTH DAILY AS NEEDED FOR PAIN 04/10/23   Allegra Grana, FNP  metFORMIN (GLUCOPHAGE) 500 MG tablet TAKE 1 TABLET BY MOUTH TWICE DAILY 02/03/23   Allegra Grana, FNP  minoxidil (LONITEN) 2.5 MG tablet Take 2.5 mg by mouth daily. Pt is taking 1/2 tablet 2 times daily    [provider]  norethindrone (MICRONOR) 0.35 MG tablet Take 1 tablet (0.35 mg total) by mouth daily. 10/31/22   Hildred Laser, MD  pantoprazole (PROTONIX) 20 MG tablet Take 1 tablet (20 mg total) by mouth daily. 11/23/21   Allegra Grana, FNP  propranolol ER (INDERAL LA) 60 MG 24 hr capsule TAKE 1 CAPSULE(60 MG) BY MOUTH DAILY 01/30/23   Allegra Grana, FNP  Semaglutide, 1 MG/DOSE, (OZEMPIC, 1 MG/DOSE,) 2 MG/1.5ML SOPN Inject 1 mg into the skin once a week. 04/07/23   Allegra Grana, FNP  SUMAtriptan (IMITREX) 100 MG tablet Take 1 tablet earliest onset of migraine.  May repeat in 2 hours if headache persists or recurs.  Maximum 2 tablets in 24 hours. 12/04/20   Everlena Cooper, Adam R, DO  topiramate (TOPAMAX) 25 MG tablet TAKE 1 TABLET(25 MG) BY MOUTH TWICE DAILY 05/26/22   Allegra Grana, FNP  traMADol (ULTRAM) 50 MG tablet Take 1-2 tablets (50-100 mg total) by mouth every 6 (six) hours as needed for moderate pain. 09/22/22   Hildred Laser, MD    Family History Family History  Problem Relation Age of Onset   Heart disease Mother    Diabetes Mother    Anxiety disorder Mother    Arthritis Mother    Skin cancer Mother    Diabetes Father    Skin cancer Father    Arthritis Father    Colon polyps Father    Peptic Ulcer Disease Father    Colon  polyps Brother    Heart disease Maternal Grandmother    Colon cancer Paternal Grandfather    Colon cancer Paternal Aunt    Thyroid cancer Neg Hx    Breast cancer Neg Hx     Social History Social History   Tobacco Use   Smoking status: Former    Current packs/day: 0.00    Average packs/day: 1.5 packs/day for 8.0 years (12.0 ttl pk-yrs)    Types: Cigarettes    Start date: 09/16/2004  Quit date: 09/16/2012    Years since quitting: 10.8   Smokeless tobacco: Never  Vaping Use   Vaping status: Former  Substance Use Topics   Alcohol use: Not Currently    Comment: 2 glasswine/month   Drug use: No     Allergies   Vyleesi [bremelanotide]   Review of Systems Review of Systems  Constitutional:  Negative for chills and fever.  Respiratory:  Negative for cough and shortness of breath.   Cardiovascular:  Negative for chest pain and palpitations.  Gastrointestinal:  Positive for abdominal pain, diarrhea and nausea. Negative for vomiting.  Genitourinary:  Negative for dysuria and hematuria.     Physical Exam Triage Vital Signs ED Triage Vitals  Encounter Vitals Group     BP      Systolic BP Percentile      Diastolic BP Percentile      Pulse      Resp      Temp      Temp src      SpO2      Weight      Height      Head Circumference      Peak Flow      Pain Score      Pain Loc      Pain Education      Exclude from Growth Chart    No data found.  Updated Vital Signs BP 104/75   Pulse (!) 125   Temp 97.7 F (36.5 C)   Resp 18   LMP 06/24/2023   SpO2 96%   Visual Acuity Right Eye Distance:   Left Eye Distance:   Bilateral Distance:    Right Eye Near:   Left Eye Near:    Bilateral Near:     Physical Exam Constitutional:      General: She is not in acute distress.    Appearance: She is ill-appearing.  HENT:     Mouth/Throat:     Mouth: Mucous membranes are dry.  Cardiovascular:     Rate and Rhythm: Regular rhythm. Tachycardia present.     Heart  sounds: Normal heart sounds.  Pulmonary:     Effort: Pulmonary effort is normal. No respiratory distress.     Breath sounds: Normal breath sounds.  Abdominal:     General: Bowel sounds are increased.     Palpations: Abdomen is soft.     Tenderness: There is generalized abdominal tenderness. There is no right CVA tenderness, left CVA tenderness, guarding or rebound.  Skin:    General: Skin is warm and dry.  Neurological:     Mental Status: She is alert.  Psychiatric:        Mood and Affect: Mood normal.        Behavior: Behavior normal.      UC Treatments / Results  Labs (all labs ordered are listed, but only abnormal results are displayed) Labs Reviewed - No data to display   EKG   Radiology No results found.  Procedures Procedures (including critical care time)  Medications Ordered in UC Medications - No data to display  Initial Impression / Assessment and Plan / UC Course  I have reviewed the triage vital signs and the nursing notes.  Pertinent labs & imaging results that were available during my care of the patient were reviewed by me and considered in my medical decision making (see chart for details).    Generalized abdominal pain, diarrhea, tachycardia.  Patient is ill-appearing with heart rate  of 125.  Her abdomen is tender to palpation without rebound or guarding.  Bowel sounds are increased.  Her symptoms started after she had taken 8 days of Augmentin.  Sending her to the ED for evaluation.  Patient is agreeable to this.  She declines EMS.  Her husband is with her and will drive her to the ED.  Final Clinical Impressions(s) / UC Diagnoses   Final diagnoses:  Generalized abdominal pain  Diarrhea, unspecified type  Tachycardia     Discharge Instructions      Go to the emergency department for evaluation of your abdominal pain and profuse diarrhea following recent antibiotic (Augmentin).     ED Prescriptions   None    PDMP not reviewed this  encounter.   Mickie Bail, NP 07/08/23 [phone]

## 2023-07-08 NOTE — ED Notes (Signed)
Patient is being discharged from the Urgent Care and sent to the Emergency Department via POV . Per Wendee Beavers NP, patient is in need of higher level of care due to abdominal pain/ diarrhea post abx use/ possible dehydration. Patient is aware and verbalizes understanding of plan of care.  Vitals:   07/08/23 1231 07/08/23 1236  BP:  104/75  Pulse: (!) 125   Resp: 18   Temp: 97.7 F (36.5 C)   SpO2: 96%

## 2023-07-08 NOTE — Discharge Instructions (Signed)
Go to the emergency department for evaluation of your abdominal pain and profuse diarrhea following recent antibiotic (Augmentin).

## 2023-07-08 NOTE — Progress Notes (Signed)
Because you are having several symptoms that are not related and require possible bloodwork, urinalysis and additional testing, I feel your condition warrants further evaluation and I recommend that you be seen in a face to face visit at an urgent care. There are several [facility] urgent cares open today. The list below is only a small list of our urgent cares   NOTE: There will be NO CHARGE for this eVisit   If you are having a true medical emergency please call 911.      For an urgent face to face visit, [place] has eight urgent care centers for your convenience:   NEW!! [facility] Urgent Care Center at [facility] Get Driving Directions [phone] [address], [address] [place], [address]    [facility] Health Urgent Care Center at [facility] Get Driving Directions [phone] [address] [place], [place] [address]   [facility] Health Urgent Care Center [facility]) Get Driving Directions [phone] [address] [place], [place] [address]  [facility] Health Urgent Care Center [facility]) Get Driving Directions [phone] [address] [place],  [place]  [address]  [facility] Urgent Care Center [facility] - at [employer]  [phone] [address] [place],  [place] [address]   [facility] Health Urgent Care at [facility] Get Driving Directions [phone], [address] [place], [place] [address]   [facility] Health Urgent Care at [facility] Get Driving Directions  [phone] [address] [place], [place] [address]   [facility] Health Urgent Care at [facility] Directions [phone] [address], [address] [place], [place] [address]  Your MyChart E-visit questionnaire answers were reviewed by a board certified advanced clinical practitioner to complete your personal care plan based on your specific symptoms.  Thank you for using  e-Visits.

## 2023-07-08 NOTE — ED Triage Notes (Signed)
Pt to ED from UC c/o diarrhea, generalized abdominal pain x 2 days. Recently on abx for bronchitis.

## 2023-07-08 NOTE — Progress Notes (Signed)
I have spent 5 minutes in review of e-visit questionnaire, review and updating patient chart, medical decision making and response to patient.  ° °Jerrell Mangel W Secilia Apps, NP ° °  °

## 2023-07-08 NOTE — ED Provider Notes (Signed)
[facility] Provider Note    Event Date/Time   First MD Initiated Contact with Patient 07/08/23 1335     (approximate)   History   Abdominal Pain   HPI  Andrea Roberson is a 37 y.o. female who presents with complaints of abdominal cramping, diarrhea with nausea but no vomiting over the last several days.  Patient recently treated for bronchitis with amoxicillin last week.  Referred from urgent care for concerns about possible C. difficile.     Physical Exam   Triage Vital Signs: ED Triage Vitals  Encounter Vitals Group     BP 07/08/23 1325 (!) 116/90     Systolic BP Percentile --      Diastolic BP Percentile --      Pulse Rate 07/08/23 1325 (!) 112     Resp 07/08/23 1325 18     Temp 07/08/23 1327 98.2 F (36.8 C)     Temp Source 07/08/23 1327 Oral     SpO2 07/08/23 1325 98 %     Weight 07/08/23 1324 76.2 kg (168 lb)     Height 07/08/23 1324 1.651 m (5\' 5" )     Head Circumference --      Peak Flow --      Pain Score 07/08/23 1324 8     Pain Loc --      Pain Education --      Exclude from Growth Chart --     Most recent vital signs: Vitals:   07/08/23 1325 07/08/23 1327  BP: (!) 116/90   Pulse: (!) 112   Resp: 18   Temp:  98.2 F (36.8 C)  SpO2: 98%      General: Awake, no distress.  CV:  Good peripheral perfusion.  Resp:  Normal effort.  Abd:  No distention.  Soft, nontender Other:     ED Results / Procedures / Treatments   Labs (all labs ordered are listed, but only abnormal results are displayed) Labs Reviewed  COMPREHENSIVE METABOLIC PANEL - Abnormal; Notable for the following components:      Result Value   Sodium 134 (*)    CO2 16 (*)    BUN 21 (*)    Total Protein 8.3 (*)    All other components within normal limits  CBC - Abnormal; Notable for the following components:   WBC 12.7 (*)    RBC 5.17 (*)    Hemoglobin 15.2 (*)    Platelets 422 (*)    All other components within normal limits  URINALYSIS,  ROUTINE W REFLEX MICROSCOPIC - Abnormal; Notable for the following components:   Color, Urine YELLOW (*)    APPearance CLOUDY (*)    Specific Gravity, Urine 1.031 (*)    Hgb urine dipstick MODERATE (*)    Protein, ur 30 (*)    Bacteria, UA RARE (*)    All other components within normal limits  C DIFFICILE QUICK SCREEN W PCR REFLEX    GASTROINTESTINAL PANEL BY PCR, STOOL (REPLACES STOOL CULTURE)  LIPASE, BLOOD     EKG     RADIOLOGY     PROCEDURES:  Critical Care performed:   Procedures   MEDICATIONS ORDERED IN ED: Medications  0.9 %  sodium chloride infusion (0 mLs Intravenous Stopped 07/08/23 1745)  ondansetron (ZOFRAN) injection 4 mg (4 mg Intravenous Given 07/08/23 1409)  pantoprazole (PROTONIX) injection 40 mg (40 mg Intravenous Given 07/08/23 1411)  ketorolac (TORADOL) 30 MG/ML injection 30 mg (30 mg Intravenous Given  07/08/23 1629)     IMPRESSION / MDM / ASSESSMENT AND PLAN / ED COURSE  I reviewed the triage vital signs and the nursing notes. Patient's presentation is most consistent with acute presentation with potential threat to life or bodily function.  Patient presents with abdominal cramping, nausea and copious diarrhea over the last several days.  Recent antibiotic usage.  Differential includes colitis, possibly infectious related to C. difficile versus other etiologies.  Viral gastroenteritis is a possibility which is prevalent in the community as well.  Will treat with IV fluids, IV Zofran, obtain labs, stool studies and reevaluate   C. difficile screen is negative, patient feeling improved after IV fluids, BioFire still pending, will call with any results, appropriate for discharge at this time with outpatient follow-up     FINAL CLINICAL IMPRESSION(S) / ED DIAGNOSES   Final diagnoses:  Diarrhea of presumed infectious origin     Rx / DC Orders   ED Discharge Orders     None        Note:  This document was prepared using Dragon voice  recognition software and may include unintentional dictation errors.   Jene Every, MD 07/08/23 [phone]

## 2023-07-08 NOTE — ED Triage Notes (Addendum)
Patient to Urgent Care with complaints of nausea/ diarrhea/ back pain and stomach ache. Denies any fevers- reports having some hot spells.  Reports her symptoms started two days ago. States similar episodes every 3-4 months with an unknown trigger.   Unable to complete her amoxicillin for bronchitis.

## 2023-07-10 NOTE — Telephone Encounter (Signed)
Spoke to pt and scheduled her an in office visit to see Evelene Croon on Thursday to address concerns

## 2023-07-10 NOTE — Telephone Encounter (Signed)
Call pt or send mychart Please sch ED follow up visit with me or colleague

## 2023-07-13 ENCOUNTER — Other Ambulatory Visit (HOSPITAL_COMMUNITY)
Admission: RE | Admit: 2023-07-13 | Discharge: 2023-07-13 | Disposition: A | Discharge status: Home / Self Care | Payer: BC Managed Care – PPO | Source: Ambulatory Visit | Attending: Nurse Practitioner | Admitting: Nurse Practitioner

## 2023-07-13 ENCOUNTER — Encounter: Payer: Self-pay | Admitting: Nurse Practitioner

## 2023-07-13 ENCOUNTER — Ambulatory Visit: Payer: BC Managed Care – PPO | Admitting: Nurse Practitioner

## 2023-07-13 ENCOUNTER — Telehealth: Payer: Self-pay

## 2023-07-13 VITALS — BP 134/84 | HR 78 | Temp 97.8°F | Ht 65.0 in | Wt 177.2 lb

## 2023-07-13 DIAGNOSIS — R3915 Urgency of urination: Secondary | ICD-10-CM

## 2023-07-13 DIAGNOSIS — N898 Other specified noninflammatory disorders of vagina: Secondary | ICD-10-CM

## 2023-07-13 DIAGNOSIS — F411 Generalized anxiety disorder: Secondary | ICD-10-CM

## 2023-07-13 DIAGNOSIS — R1030 Lower abdominal pain, unspecified: Secondary | ICD-10-CM

## 2023-07-13 DIAGNOSIS — R7989 Other specified abnormal findings of blood chemistry: Secondary | ICD-10-CM | POA: Diagnosis not present

## 2023-07-13 DIAGNOSIS — R197 Diarrhea, unspecified: Secondary | ICD-10-CM

## 2023-07-13 LAB — POC URINALSYSI DIPSTICK (AUTOMATED)
Bilirubin, UA: NEGATIVE
Blood, UA: NEGATIVE
Glucose, UA: NEGATIVE
Ketones, UA: NEGATIVE
Leukocytes, UA: NEGATIVE
Nitrite, UA: NEGATIVE
Protein, UA: NEGATIVE
Spec Grav, UA: 1.015 (ref 1.010–1.025)
Urobilinogen, UA: 0.2 E.U./dL
pH, UA: 7 (ref 5.0–8.0)

## 2023-07-13 MED ORDER — FLUOXETINE HCL 40 MG PO CAPS
40.0000 mg | ORAL_CAPSULE | Freq: Every day | ORAL | 3 refills | Status: DC
Start: 2023-07-13 — End: 2024-08-09

## 2023-07-13 MED ORDER — BUPROPION HCL ER (XL) 150 MG PO TB24
150.0000 mg | ORAL_TABLET | Freq: Every day | ORAL | 3 refills | Status: DC
Start: 2023-07-13 — End: 2024-08-09

## 2023-07-13 NOTE — Telephone Encounter (Signed)
Patient is aware and understands.

## 2023-07-13 NOTE — Telephone Encounter (Signed)
Patient is here for a possible UTI and requests refills for Bupropion, Prozac, Tramadol and Sumatriptan. Patient last appointment with you was 01/30/23 and no next visit scheduled.

## 2023-07-13 NOTE — Telephone Encounter (Signed)
close

## 2023-07-14 LAB — URINALYSIS, ROUTINE W REFLEX MICROSCOPIC
Bilirubin Urine: NEGATIVE
Hgb urine dipstick: NEGATIVE
Ketones, ur: NEGATIVE
Leukocytes,Ua: NEGATIVE
Nitrite: NEGATIVE
Specific Gravity, Urine: 1.015 (ref 1.000–1.030)
Total Protein, Urine: NEGATIVE
Urine Glucose: NEGATIVE
Urobilinogen, UA: 0.2 (ref 0.0–1.0)
pH: 7.5 (ref 5.0–8.0)

## 2023-07-14 LAB — URINE CULTURE
MICRO NUMBER:: 15520533
SPECIMEN QUALITY:: ADEQUATE

## 2023-07-14 LAB — CBC
HCT: 35.7 % — ABNORMAL LOW (ref 36.0–46.0)
Hemoglobin: 11.9 g/dL — ABNORMAL LOW (ref 12.0–15.0)
MCHC: 33.4 g/dL (ref 30.0–36.0)
MCV: 88.4 fL (ref 78.0–100.0)
Platelets: 349 10*3/uL (ref 150.0–400.0)
RBC: 4.04 Mil/uL (ref 3.87–5.11)
RDW: 12.3 % (ref 11.5–15.5)
WBC: 9.9 10*3/uL (ref 4.0–10.5)

## 2023-07-17 ENCOUNTER — Telehealth: Payer: Self-pay

## 2023-07-17 DIAGNOSIS — N898 Other specified noninflammatory disorders of vagina: Secondary | ICD-10-CM | POA: Insufficient documentation

## 2023-07-17 DIAGNOSIS — R197 Diarrhea, unspecified: Secondary | ICD-10-CM | POA: Insufficient documentation

## 2023-07-17 DIAGNOSIS — R3915 Urgency of urination: Secondary | ICD-10-CM | POA: Insufficient documentation

## 2023-07-17 LAB — CERVICOVAGINAL ANCILLARY ONLY
Bacterial Vaginitis (gardnerella): NEGATIVE
Candida Glabrata: NEGATIVE
Candida Vaginitis: NEGATIVE
Chlamydia: NEGATIVE
Comment: NEGATIVE
Comment: NEGATIVE
Comment: NEGATIVE
Comment: NEGATIVE
Comment: NEGATIVE
Comment: NORMAL
Neisseria Gonorrhea: NEGATIVE
Trichomonas: NEGATIVE

## 2023-07-17 LAB — CELIAC DISEASE PANEL
(tTG) Ab, IgA: <1 U/mL
(tTG) Ab, IgG: <1 U/mL
Gliadin IgA: 1.2 U/mL
Gliadin IgG: 10.3 U/mL
Immunoglobulin A: 180 mg/dL (ref 47–310)

## 2023-07-17 NOTE — Telephone Encounter (Signed)
-----   Message from Kara Dies sent at 07/17/2023  1:19 AM EDT ----- Please inform the patient: Hemoglobin 11.9 slightly anemia, celiac panel pending. Urine culture indicates no UTI.  Vaginal swab normal

## 2023-07-17 NOTE — Assessment & Plan Note (Signed)
Intermittent diarrhea, will check celiac panel

## 2023-07-17 NOTE — Telephone Encounter (Signed)
Lvm for pt

## 2023-07-17 NOTE — Assessment & Plan Note (Signed)
POCT urinalysis negative for leukocyte, hematuria, nitrite. Culture negative for UTI.

## 2023-07-17 NOTE — Assessment & Plan Note (Signed)
Will perform patient tolerated well. Swab negative for yeast, BV, chlamydia, trichomonas and  gonorrhea

## 2023-07-17 NOTE — Progress Notes (Signed)
Please inform the patient: Hemoglobin 11.9 slightly anemia, celiac panel pending. Urine culture indicates no UTI.  Vaginal swab normal

## 2023-07-30 ENCOUNTER — Encounter: Payer: Self-pay | Admitting: Obstetrics and Gynecology

## 2023-07-31 MED ORDER — ACETAMINOPHEN-CODEINE 300-30 MG PO TABS
1.0000 | ORAL_TABLET | Freq: Four times a day (QID) | ORAL | 1 refills | Status: DC | PRN
Start: 2023-07-31 — End: 2023-11-16

## 2023-08-30 ENCOUNTER — Encounter: Payer: Self-pay | Admitting: Family

## 2023-08-30 NOTE — Telephone Encounter (Signed)
Call pt  I sent her MyChart message this morning due to constellation of however I would like to move her appointment up.  Please schedule first available with me and let me know when scheduled  Please let me know if I need to place a referral to gastroenterology.  I asked patient if she has a GI physician or if she has been referred.  I do not see any notes in the chart

## 2023-08-30 NOTE — Telephone Encounter (Signed)
Spoke to pt and she does need referral for GI but she is unable to move appt up because of work.

## 2023-08-31 ENCOUNTER — Telehealth: Payer: Self-pay | Admitting: Family

## 2023-08-31 ENCOUNTER — Other Ambulatory Visit: Payer: Self-pay | Admitting: Family

## 2023-08-31 DIAGNOSIS — R1010 Upper abdominal pain, unspecified: Secondary | ICD-10-CM

## 2023-08-31 DIAGNOSIS — R109 Unspecified abdominal pain: Secondary | ICD-10-CM

## 2023-08-31 NOTE — Telephone Encounter (Signed)
Call [facility] patient previously had colonoscopy in 2023 with Dr. Allegra Lai.  She is willing to see first provider available in office Patient is having abdominal pain, diarrhea Please call to schedule for patient   GI pathogen panel and Cdiff negative 07/08/2023  CT a/p 08/2022 without acute findings

## 2023-09-01 ENCOUNTER — Telehealth: Payer: Self-pay

## 2023-09-01 NOTE — Telephone Encounter (Signed)
LVM to inform pt that I spoke with [facility] and they are going to contact her to get her scheduled with first available provider

## 2023-09-01 NOTE — Telephone Encounter (Signed)
LVM @ [facility] to call back to get pt scheduled with first available provider due to abdominal pain and diarrhea per note below

## 2023-09-01 NOTE — Telephone Encounter (Signed)
Jenate at Patient Primary care office called and left a message stating she was calling to make a appointment for patient that Rennie Plowman placed a referral for patient for. She said to call her back at [phone]. Return the call and the referral was just placed yesterday. It was not marked as urgent. Informed Jenate that we will call the patient to schedule the patient she is in the schedulers workqueue.   Called patient and left a message for call back

## 2023-09-04 NOTE — Telephone Encounter (Signed)
Pt is aware.  

## 2023-09-07 ENCOUNTER — Ambulatory Visit: Payer: BC Managed Care – PPO | Admitting: Family

## 2023-09-07 VITALS — BP 130/74 | HR 83 | Temp 98.1°F | Ht 65.0 in | Wt 170.0 lb

## 2023-09-07 DIAGNOSIS — R1013 Epigastric pain: Secondary | ICD-10-CM

## 2023-09-07 DIAGNOSIS — R198 Other specified symptoms and signs involving the digestive system and abdomen: Secondary | ICD-10-CM

## 2023-09-07 MED ORDER — PANTOPRAZOLE SODIUM 20 MG PO TBEC
40.0000 mg | DELAYED_RELEASE_TABLET | Freq: Every day | ORAL | 1 refills | Status: DC
Start: 2023-09-07 — End: 2023-10-05

## 2023-09-07 MED ORDER — DICYCLOMINE HCL 10 MG PO CAPS
10.0000 mg | ORAL_CAPSULE | Freq: Three times a day (TID) | ORAL | 1 refills | Status: DC | PRN
Start: 2023-09-07 — End: 2023-10-05

## 2023-09-07 NOTE — Progress Notes (Signed)
Assessment & Plan:  Alternating constipation and diarrhea Assessment & Plan: GI pathogen panel, C. difficile quick screen negative 07/07/2026 Seen at [facility] urgent care 12/07/2022 for emesis.  Provided Zofran. Negative urine culture 07/13/2023 Negative celiac screen 07/13/2023 Right upper quadrant ultrasound 11/22/2021 no acute abnormalities, no evidence of cholelithiasis.  CT abdomen pelvis 08/31/2022 for right lower quadrant pain without acute etiology   Extensive chart review .  Benign exam today.  Patient nontoxic in appearance   intentional versus unintentional weight loss (previously been on Ozempic since discontinued) .  Advised to stop metformin and to remain off Ozempic. Differentials include IBS, gastroparesis, constipation with overflow diarrhea. Advised to start Metamucil and/or MiraLAX if stool is not soft , daily. Provided Bentyl to use as needed for intense spasms  Until she is seen by GI, advised her to increase Protonix from 20 mg to 40 mg daily .  Pending fecal calprotectin, gastric emptying study  Of note, there seems to be a correlation ( not every time) when episodes of abdominal pain coincide with menses, menstrual cramping.  She is scheduling to see Dr. Valentino Saxon, will follow. She is taking tylenol #3 prn.   Scheduled to see gastroenterology next month. Will follow.   Orders: -     Calprotectin, Fecal; Future -     Dicyclomine HCl; Take 1 capsule (10 mg total) by mouth 3 (three) times daily as needed for spasms. Use prn in anticipation of a stressor that triggers abdominal pain.  Dispense: 30 capsule; Refill: 1 -     Pantoprazole Sodium; Take 2 tablets (40 mg total) by mouth daily.  Dispense: 90 tablet; Refill: 1 -     NM GASTRIC EMPTYING; Future  Dyspepsia -     Pantoprazole Sodium; Take 2 tablets (40 mg total) by mouth daily.  Dispense: 90 tablet; Refill: 1 -     NM GASTRIC EMPTYING; Future     Return precautions given.   Risks, benefits, and alternatives of the  medications and treatment plan prescribed today were discussed, and patient expressed understanding.   Education regarding symptom management and diagnosis given to patient on AVS either electronically or printed.  No follow-ups on file.  Andrea Plowman, FNP  I have spent 40 minutes with a patient including precharting, exam, reviewing medical records, and discussion plan of care.     Subjective:    Patient ID: Andrea Roberson, female    DOB: July 28, 1986, 37 y.o.   MRN: [id number]  CC: Andrea Roberson is a 37 y.o. female who presents today for follow up.   HPI: Complains of abdominal pain, episodic, x 2 years.   Episodes have become more frequent.      Endorses intermittent constipation and diarrhea.   She is not taking miralax right now as stool is more soft. She uses miralax to regluate her bowels; previously on Linzess however this caused diarrhea.   Last episode 14 days ago while at work. She was at work and she was able to continue work.  Episode starts with upper abdomen bloating and then over a course of 24 hours, she one episode bilious emesis, nonbloody , proceeded by diarrhea, watery brown without blood, approx 10+ episodes of diarrhea.   She will drink bone broth and water until symptoms resolve.   Abdominal pain is worse if she eats solid food however episode is not triggered by meal. During episode she can only eat small amounts of clear liquids  Episode associated with abdominal bloating, N, V,  and diarrhea ; at times pain is severe.   H/o tubal ligation  She is not using NSAIDs reguarly.  She stopped ozempic 3 months ago  Compliant with Prozac 40 mg daily  Dr. Valentino Saxon provided her with Tylenol 3 for menstrual related pain.   Appointment with gastroenterology 10/05/23  History of GERD, bipolar disorder, hyperlipidemia  Former smoker  GI pathogen panel, C. difficile quick screen negative 07/07/2026 Seen at [facility] urgent care 12/07/2022 for emesis.   Provided Zofran. Negative urine culture 07/13/2023 Negative celiac screen 07/13/2023 Right upper quadrant ultrasound 11/22/2021 no acute abnormalities, no evidence of cholelithiasis.  CT abdomen pelvis 08/31/2022 for right lower quadrant pain without acute etiology  Last seen by  Dr Allegra Lai 01/13/22 for chronic idiopathic constipation.  Discussed Aphthous ulcers in the terminal ileum which likely nonspecific/incidental finding .  Consider video capsule endoscopy.  Advised high-fiber diet.   She never return fecal calprotectin   She is taking micronor to regulate her menses by Dr Valentino Saxon. Complains of intense menstral pain and she will take tylenol #3 prn.   She had heavy bleeding.   H/o migraines  Allergies: Vyleesi [bremelanotide] Current Outpatient Medications on File Prior to Visit  Medication Sig Dispense Refill   acetaminophen-codeine (TYLENOL #3) 300-30 MG tablet Take 1-2 tablets by mouth every 6 (six) hours as needed for moderate pain (pain score 4-6) or severe pain (pain score 7-10). 20 tablet 1   buPROPion (WELLBUTRIN XL) 150 MG 24 hr tablet Take 1 tablet (150 mg total) by mouth daily with breakfast. 90 tablet 3   cetirizine (ZYRTEC) 10 MG tablet Take 10 mg by mouth daily as needed for allergies.      FLUoxetine (PROZAC) 40 MG capsule Take 1 capsule (40 mg total) by mouth daily. 90 capsule 3   fluticasone (FLONASE) 50 MCG/ACT nasal spray Place 2 sprays into both nostrils daily as needed for allergies.      hydrOXYzine (ATARAX/VISTARIL) 25 MG tablet Take 0.5-1 tablets (12.5-25 mg total) by mouth 2 (two) times daily as needed. For anxiety, sleep 60 tablet 1   ipratropium (ATROVENT) 0.06 % nasal spray Place 2 sprays into both nostrils 4 (four) times daily. 15 mL 12   meloxicam (MOBIC) 15 MG tablet TAKE 1 TABLET(15 MG) BY MOUTH DAILY AS NEEDED FOR PAIN 30 tablet 1   metFORMIN (GLUCOPHAGE) 500 MG tablet TAKE 1 TABLET BY MOUTH TWICE DAILY 180 tablet 3   minoxidil (LONITEN) 2.5 MG tablet Take  2.5 mg by mouth daily. Pt is taking 1/2 tablet 2 times daily     norethindrone (MICRONOR) 0.35 MG tablet Take 1 tablet (0.35 mg total) by mouth daily. 84 tablet 3   propranolol ER (INDERAL LA) 60 MG 24 hr capsule TAKE 1 CAPSULE(60 MG) BY MOUTH DAILY 90 capsule 3   SUMAtriptan (IMITREX) 100 MG tablet Take 1 tablet earliest onset of migraine.  May repeat in 2 hours if headache persists or recurs.  Maximum 2 tablets in 24 hours. 10 tablet 5   topiramate (TOPAMAX) 25 MG tablet TAKE 1 TABLET(25 MG) BY MOUTH TWICE DAILY 120 tablet 2   Current Facility-Administered Medications on File Prior to Visit  Medication Dose Route Frequency Provider Last Rate Last Admin   betamethasone acetate-betamethasone sodium phosphate (CELESTONE) injection 3 mg  3 mg Intra-articular Once Felecia Shelling, DPM        Review of Systems  Constitutional:  Positive for fatigue and unexpected weight change. Negative for chills and fever.  Respiratory:  Negative  for cough.   Cardiovascular:  Negative for chest pain and palpitations.  Gastrointestinal:  Positive for abdominal distention, abdominal pain, constipation, diarrhea, nausea and vomiting.  Genitourinary:  Negative for dysuria.      Objective:    BP 130/74   Pulse 83   Temp 98.1 F (36.7 C) (Oral)   Ht 5\' 5"  (1.651 m)   Wt 170 lb (77.1 kg)   SpO2 98%   BMI 28.29 kg/m  BP Readings from Last 3 Encounters:  09/07/23 130/74  07/13/23 134/84  07/08/23 (!) 116/90   Wt Readings from Last 3 Encounters:  09/07/23 170 lb (77.1 kg)  07/13/23 177 lb 3.2 oz (80.4 kg)  07/08/23 168 lb (76.2 kg)    Physical Exam Vitals reviewed.  Constitutional:      Appearance: Normal appearance. She is well-developed.  Eyes:     Conjunctiva/sclera: Conjunctivae normal.  Cardiovascular:     Rate and Rhythm: Normal rate and regular rhythm.     Pulses: Normal pulses.     Heart sounds: Normal heart sounds.  Pulmonary:     Effort: Pulmonary effort is normal.     Breath sounds:  Normal breath sounds. No wheezing, rhonchi or rales.  Abdominal:     General: Bowel sounds are normal. There is no distension.     Palpations: Abdomen is soft. Abdomen is not rigid. There is no fluid wave or mass.     Tenderness: There is no abdominal tenderness. There is no guarding or rebound.  Skin:    General: Skin is warm and dry.  Neurological:     Mental Status: She is alert.  Psychiatric:        Speech: Speech normal.        Behavior: Behavior normal.        Thought Content: Thought content normal.

## 2023-09-07 NOTE — Patient Instructions (Addendum)
I provided you with Bentyl which is antispasmodic to use for abdominal spasm  Increase protonix to 40mg  in the morning until you to gastroenterology  I have ordered stool studies for you to bring [facility]. Screen for chronic inflammation.    To bulk stool, you may either try Metamucil 3.4 g daily up to 3 times daily .  Or you may try Benefiber 1 tablespoon 3 times daily with meals.  Benefiber and Metamucil work in the same way. They absorb water from your intestines to form softer, bulkier stools. These stools flow more easily through your digestive system, which helps you have easier bowel movements. These supplements also increase how often you have bowel movements.  It is important to increase dietary fiber and water intake as well.  Or consider miralax and titration.  As discussed, the most important thing is to prevent constipation.      Low-FODMAP Eating Plan  FODMAP stands for fermentable oligosaccharides, disaccharides, monosaccharides, and polyols. These are sugars that are hard for some people to digest. A low-FODMAP eating plan may help some people who have irritable bowel syndrome (IBS) and certain other bowel (intestinal) diseases to manage their symptoms. This meal plan can be complicated to follow. Work with a diet and nutrition specialist (dietitian) to make a low-FODMAP eating plan that is right for you. A dietitian can help make sure that you get enough nutrition from this diet. What are tips for following this plan? Reading food labels Check labels for hidden FODMAPs such as: High-fructose syrup. Honey. Agave. Natural fruit flavors. Onion or garlic powder. Choose low-FODMAP foods that contain 3-4 grams of fiber per serving. Check food labels for serving sizes. Eat only one serving at a time to make sure FODMAP levels stay low. Shopping Shop with a list of foods that are recommended on this diet and make a meal plan. Meal planning Follow a low-FODMAP eating  plan for up to 6 weeks, or as told by your health care provider or dietitian. To follow the eating plan: Eliminate high-FODMAP foods from your diet completely. Choose only low-FODMAP foods to eat. You will do this for 2-6 weeks. Gradually reintroduce high-FODMAP foods into your diet one at a time. Most people should wait a few days before introducing the next new high-FODMAP food into their meal plan. Your dietitian can recommend how quickly you may reintroduce foods. Keep a daily record of what and how much you eat and drink. Make note of any symptoms that you have after eating. Review your daily record with a dietitian regularly to identify which foods you can eat and which foods you should avoid. General tips Drink enough fluid each day to keep your urine pale yellow. Avoid processed foods. These often have added sugar and may be high in FODMAPs. Avoid most dairy products, whole grains, and sweeteners. Work with a dietitian to make sure you get enough fiber in your diet. Avoid high FODMAP foods at meals to manage symptoms. Recommended foods Fruits Bananas, oranges, tangerines, lemons, limes, blueberries, raspberries, strawberries, grapes, cantaloupe, honeydew melon, kiwi, papaya, passion fruit, and pineapple. Limited amounts of dried cranberries, banana chips, and shredded coconut. Vegetables Eggplant, zucchini, cucumber, peppers, green beans, bean sprouts, lettuce, arugula, kale, Swiss chard, spinach, collard greens, bok choy, summer squash, potato, and tomato. Limited amounts of corn, carrot, and sweet potato. Green parts of scallions. Grains Gluten-free grains, such as rice, oats, buckwheat, quinoa, corn, polenta, and millet. Gluten-free pasta, bread, or cereal. Rice noodles. Corn tortillas. Meats  and other proteins Unseasoned beef, pork, poultry, or fish. Eggs. Tomasa Blase. Tofu (firm) and tempeh. Limited amounts of nuts and seeds, such as almonds, walnuts, [place] nuts, pecans, peanuts, nut  butters, pumpkin seeds, chia seeds, and sunflower seeds. Dairy Lactose-free milk, yogurt, and kefir. Lactose-free cottage cheese and ice cream. Non-dairy milks, such as almond, coconut, hemp, and rice milk. Non-dairy yogurt. Limited amounts of goat cheese, brie, mozzarella, parmesan, swiss, and other hard cheeses. Fats and oils Butter-free spreads. Vegetable oils, such as olive, canola, and sunflower oil. Seasoning and other foods Artificial sweeteners with names that do not end in "ol," such as aspartame, saccharine, and stevia. Maple syrup, white table sugar, raw sugar, brown sugar, and molasses. Mayonnaise, soy sauce, and tamari. Fresh basil, coriander, parsley, rosemary, and thyme. Beverages Water and mineral water. Sugar-sweetened soft drinks. Small amounts of orange juice or cranberry juice. Black and green tea. Most dry wines. Coffee. The items listed above may not be a complete list of foods and beverages you can eat. Contact a dietitian for more information. Foods to avoid Fruits Fresh, dried, and juiced forms of apple, pear, watermelon, peach, plum, cherries, apricots, blackberries, boysenberries, figs, nectarines, and mango. Avocado. Vegetables Chicory root, artichoke, asparagus, cabbage, snow peas, Brussels sprouts, broccoli, sugar snap peas, mushrooms, celery, and cauliflower. Onions, garlic, leeks, and the white part of scallions. Grains Wheat, including kamut, durum, and semolina. Barley and bulgur. Couscous. Wheat-based cereals. Wheat noodles, bread, crackers, and pastries. Meats and other proteins Fried or fatty meat. Sausage. Cashews and pistachios. Soybeans, baked beans, black beans, chickpeas, kidney beans, fava beans, navy beans, lentils, black-eyed peas, and split peas. Dairy Milk, yogurt, ice cream, and soft cheese. Cream and sour cream. Milk-based sauces. Custard. Buttermilk. Soy milk. Seasoning and other foods Any sugar-free gum or candy. Foods that contain artificial  sweeteners such as sorbitol, mannitol, isomalt, or xylitol. Foods that contain honey, high-fructose corn syrup, or agave. Bouillon, vegetable stock, beef stock, and chicken stock. Garlic and onion powder. Condiments made with onion, such as hummus, chutney, pickles, relish, salad dressing, and salsa. Tomato paste. Beverages Chicory-based drinks. Coffee substitutes. Chamomile tea. Fennel tea. Sweet or fortified wines such as port or sherry. Diet soft drinks made with isomalt, mannitol, maltitol, sorbitol, or xylitol. Apple, pear, and mango juice. Juices with high-fructose corn syrup. The items listed above may not be a complete list of foods and beverages you should avoid. Contact a dietitian for more information. Summary FODMAP stands for fermentable oligosaccharides, disaccharides, monosaccharides, and polyols. These are sugars that are hard for some people to digest. A low-FODMAP eating plan is a short-term diet that helps to ease symptoms of certain bowel diseases. The eating plan usually lasts up to 6 weeks. After that, high-FODMAP foods are reintroduced gradually and one at a time. This can help you find out which foods may be causing symptoms. A low-FODMAP eating plan can be complicated. It is best to work with a dietitian who has experience with this type of plan. This information is not intended to replace advice given to you by your health care provider. Make sure you discuss any questions you have with your health care provider. Document Revised: 02/20/2020 Document Reviewed: 02/20/2020 Elsevier Patient Education  2024 [employer].

## 2023-09-07 NOTE — Assessment & Plan Note (Addendum)
GI pathogen panel, C. difficile quick screen negative 07/07/2026 Seen at [facility] urgent care 12/07/2022 for emesis.  Provided Zofran. Negative urine culture 07/13/2023 Negative celiac screen 07/13/2023 Right upper quadrant ultrasound 11/22/2021 no acute abnormalities, no evidence of cholelithiasis.  CT abdomen pelvis 08/31/2022 for right lower quadrant pain without acute etiology   Extensive chart review .  Benign exam today.  Patient nontoxic in appearance   intentional versus unintentional weight loss (previously been on Ozempic since discontinued) .  Advised to stop metformin and to remain off Ozempic. Differentials include IBS, gastroparesis, constipation with overflow diarrhea. Advised to start Metamucil and/or MiraLAX if stool is not soft , daily. Provided Bentyl to use as needed for intense spasms  Until she is seen by GI, advised her to increase Protonix from 20 mg to 40 mg daily .  Pending fecal calprotectin, gastric emptying study  Of note, there seems to be a correlation ( not every time) when episodes of abdominal pain coincide with menses, menstrual cramping.  She is scheduling to see Dr. Valentino Saxon, will follow. She is taking tylenol #3 prn.   Scheduled to see gastroenterology next month. Will follow.

## 2023-09-11 ENCOUNTER — Telehealth: Payer: Self-pay | Admitting: Family

## 2023-09-11 ENCOUNTER — Other Ambulatory Visit: Payer: Self-pay | Admitting: Family

## 2023-09-11 ENCOUNTER — Encounter: Payer: Self-pay | Admitting: Family

## 2023-09-11 DIAGNOSIS — Z833 Family history of diabetes mellitus: Secondary | ICD-10-CM

## 2023-09-11 NOTE — Telephone Encounter (Signed)
Lft pt vm to call ofc to sch NM. thanks

## 2023-09-13 ENCOUNTER — Ambulatory Visit: Payer: BC Managed Care – PPO | Admitting: Family

## 2023-09-13 ENCOUNTER — Other Ambulatory Visit
Admission: RE | Admit: 2023-09-13 | Discharge: 2023-09-13 | Disposition: A | Discharge status: Home / Self Care | Payer: BC Managed Care – PPO | Attending: Family | Admitting: Family

## 2023-09-13 DIAGNOSIS — R198 Other specified symptoms and signs involving the digestive system and abdomen: Secondary | ICD-10-CM | POA: Insufficient documentation

## 2023-09-13 DIAGNOSIS — Z833 Family history of diabetes mellitus: Secondary | ICD-10-CM | POA: Diagnosis present

## 2023-09-13 LAB — HEMOGLOBIN A1C
Hgb A1c MFr Bld: 5 % (ref 4.8–5.6)
Mean Plasma Glucose: 96.8 mg/dL

## 2023-09-16 LAB — CALPROTECTIN, FECAL: Calprotectin, Fecal: 14 ug/g (ref 0–120)

## 2023-09-21 ENCOUNTER — Ambulatory Visit: Payer: BC Managed Care – PPO

## 2023-09-22 ENCOUNTER — Encounter
Admission: RE | Admit: 2023-09-22 | Discharge: 2023-09-22 | Disposition: A | Discharge status: Home / Self Care | Payer: BC Managed Care – PPO | Source: Ambulatory Visit | Attending: Family | Admitting: Family

## 2023-09-22 DIAGNOSIS — R198 Other specified symptoms and signs involving the digestive system and abdomen: Secondary | ICD-10-CM | POA: Insufficient documentation

## 2023-09-22 DIAGNOSIS — R1013 Epigastric pain: Secondary | ICD-10-CM | POA: Diagnosis present

## 2023-09-22 MED ORDER — TECHNETIUM TC 99M SULFUR COLLOID
2.6500 | Freq: Once | INTRAVENOUS | Status: AC | PRN
  Administered 2023-09-22: 2.65 via ORAL

## 2023-10-05 ENCOUNTER — Ambulatory Visit: Payer: BC Managed Care – PPO | Admitting: Physician Assistant

## 2023-10-05 ENCOUNTER — Encounter: Payer: Self-pay | Admitting: Physician Assistant

## 2023-10-05 VITALS — BP 106/71 | HR 70 | Temp 97.5°F | Ht 65.0 in | Wt 170.8 lb

## 2023-10-05 DIAGNOSIS — K219 Gastro-esophageal reflux disease without esophagitis: Secondary | ICD-10-CM | POA: Diagnosis not present

## 2023-10-05 DIAGNOSIS — R109 Unspecified abdominal pain: Secondary | ICD-10-CM

## 2023-10-05 DIAGNOSIS — K2970 Gastritis, unspecified, without bleeding: Secondary | ICD-10-CM

## 2023-10-05 DIAGNOSIS — R1010 Upper abdominal pain, unspecified: Secondary | ICD-10-CM | POA: Diagnosis not present

## 2023-10-05 DIAGNOSIS — K582 Mixed irritable bowel syndrome: Secondary | ICD-10-CM

## 2023-10-05 DIAGNOSIS — R197 Diarrhea, unspecified: Secondary | ICD-10-CM

## 2023-10-05 DIAGNOSIS — R1013 Epigastric pain: Secondary | ICD-10-CM

## 2023-10-05 DIAGNOSIS — R198 Other specified symptoms and signs involving the digestive system and abdomen: Secondary | ICD-10-CM

## 2023-10-05 MED ORDER — DICYCLOMINE HCL 10 MG PO CAPS: 10.0000 mg | ORAL_CAPSULE | Freq: Three times a day (TID) | ORAL | 3 refills | Status: DC | PRN

## 2023-10-05 MED ORDER — FAMOTIDINE 20 MG PO TABS: 20.0000 mg | ORAL_TABLET | Freq: Two times a day (BID) | ORAL | 3 refills | Status: AC

## 2023-10-05 MED ORDER — PANTOPRAZOLE SODIUM 20 MG PO TBEC: 40.0000 mg | DELAYED_RELEASE_TABLET | Freq: Every day | ORAL | 3 refills | Status: AC

## 2023-10-05 NOTE — Progress Notes (Signed)
Celso Amy, PA-C [address]  [address]  [place], [place] [address]  Main: [phone]  Fax: [phone]   Gastroenterology Consultation  Referring Provider:     Allegra Grana, FNP Primary Care Physician:  Allegra Grana, FNP Primary Gastroenterologist:  Celso Amy, PA-C / Dr. Lannette Donath   Reason for Consultation:     Abdominal Pain, IBS, GERD        HPI:   Jacquelynn CIRE GALAN is a 37 y.o. y/o female referred for consultation & management  by Allegra Grana, FNP.    Estab. Pt. Of Dr. Allegra Lai.  She has history of irritable bowel syndrome and GERD.  Chronic lifelong GI symptoms including constipation, diarrhea, bloating, gas.  Has tried multiple treatments for constipation including MiraLAX and Linzess.  Tried Protonix and omeprazole in the past for GERD.  Currently takes Protonix 20 mg twice daily and dicyclomine 10 Mg 3 times daily as needed.  No treatment for constipation.  Recently she has been having diarrhea with generalized upper abdominal cramping.  Reports increased belching and gas.  Watery diarrhea.  She went to the ER for IV fluids.  She has a bad flareup every 3 months.  She has never had food allergy testing.  Her son has multiple food allergies.  She reports her GI symptoms are becoming more frequent.  Admits to stress.  Works as a Runner, broadcasting/film/video.  Denies rectal bleeding, weight loss, or alarm symptoms.  06/2023: Celiac panel labs negative.  Normal CBC.  Negative C. difficile and GI pathogen panel.  Normal fecal calprotectin stool test.  08/2022: CT abdomen pelvis with contrast: No acute abnormality.  12/2021 colonoscopy: Good prep, a few nonbleeding aphthous ulcers at the terminal ileum, otherwise normal colon.  Small Nonbleeding external hemorrhoids.  Terminal ileal biopsy consistent with acute enteritis.  11/2021 RUQ ultrasound normal.  No bowel signs.  08/2017 EGD: Mild gastritis, otherwise normal.  Biopsies negative for H. pylori.  Past Medical  History:  Diagnosis Date   Anxiety    Asthma    Bipolar disorder (HCC)    Depression    Frequent headaches    GERD (gastroesophageal reflux disease)    Gestational diabetes    History of gestational diabetes 2016   Hyperlipidemia    Migraines    Obesity (BMI 35.0-39.9 without comorbidity) 12/24/2015   Tarsal coalition of left foot    TMJ (dislocation of temporomandibular joint)    UTI (urinary tract infection)    Wears contact lenses     Past Surgical History:  Procedure Laterality Date   BREAST BIOPSY Left 03/03/2022   stereo bx of left breast asymmetry, ribbon marker, neg   BREAST BIOPSY Left 03/03/2022   [place] bx of left axilla LN, NO MARKER PLACED, neg   CESAREAN SECTION  2015   CESAREAN SECTION WITH BILATERAL TUBAL LIGATION N/A 11/19/2018   Procedure: CESAREAN SECTION WITH BILATERAL TUBAL LIGATION;  Surgeon: Hildred Laser, MD;  Location: [facility];  Service: Obstetrics;  Laterality: N/A;  TOB 6962 APGAR 8/8     COLONOSCOPY WITH PROPOFOL N/A 01/07/2022   Procedure: COLONOSCOPY WITH PROPOFOL;  Surgeon: Toney Reil, MD;  Location: [facility];  Service: Gastroenterology;  Laterality: N/A;   ESOPHAGOGASTRODUODENOSCOPY (EGD) WITH PROPOFOL N/A 08/30/2017   Procedure: ESOPHAGOGASTRODUODENOSCOPY (EGD) WITH PROPOFOL;  Surgeon: Toney Reil, MD;  [facility];  Service: Endoscopy;  Laterality: N/A;   TUBAL LIGATION     WISDOM TOOTH EXTRACTION  Prior to Admission medications   Medication Sig Start Date End Date Taking? Authorizing Provider  acetaminophen-codeine (TYLENOL #3) 300-30 MG tablet Take 1-2 tablets by mouth every 6 (six) hours as needed for moderate pain (pain score 4-6) or severe pain (pain score 7-10). 07/31/23   Hildred Laser, MD  buPROPion (WELLBUTRIN XL) 150 MG 24 hr tablet Take 1 tablet (150 mg total) by mouth daily with breakfast. 07/13/23   Allegra Grana, FNP  cetirizine (ZYRTEC) 10 MG tablet Take 10 mg by mouth daily as  needed for allergies.     [provider]  dicyclomine (BENTYL) 10 MG capsule Take 1 capsule (10 mg total) by mouth 3 (three) times daily as needed for spasms. Use prn in anticipation of a stressor that triggers abdominal pain. 09/07/23   Allegra Grana, FNP  FLUoxetine (PROZAC) 40 MG capsule Take 1 capsule (40 mg total) by mouth daily. 07/13/23   Allegra Grana, FNP  fluticasone (FLONASE) 50 MCG/ACT nasal spray Place 2 sprays into both nostrils daily as needed for allergies.     [provider]  hydrOXYzine (ATARAX/VISTARIL) 25 MG tablet Take 0.5-1 tablets (12.5-25 mg total) by mouth 2 (two) times daily as needed. For anxiety, sleep 09/05/19   Jomarie Longs, MD  ipratropium (ATROVENT) 0.06 % nasal spray Place 2 sprays into both nostrils 4 (four) times daily. 02/20/22   Becky Augusta, NP  meloxicam (MOBIC) 15 MG tablet TAKE 1 TABLET(15 MG) BY MOUTH DAILY AS NEEDED FOR PAIN 04/10/23   Allegra Grana, FNP  metFORMIN (GLUCOPHAGE) 500 MG tablet TAKE 1 TABLET BY MOUTH TWICE DAILY 02/03/23   Allegra Grana, FNP  minoxidil (LONITEN) 2.5 MG tablet Take 2.5 mg by mouth daily. Pt is taking 1/2 tablet 2 times daily    [provider]  norethindrone (MICRONOR) 0.35 MG tablet Take 1 tablet (0.35 mg total) by mouth daily. 10/31/22   Hildred Laser, MD  pantoprazole (PROTONIX) 20 MG tablet Take 2 tablets (40 mg total) by mouth daily. 09/07/23   Allegra Grana, FNP  propranolol ER (INDERAL LA) 60 MG 24 hr capsule TAKE 1 CAPSULE(60 MG) BY MOUTH DAILY 01/30/23   Allegra Grana, FNP  SUMAtriptan (IMITREX) 100 MG tablet Take 1 tablet earliest onset of migraine.  May repeat in 2 hours if headache persists or recurs.  Maximum 2 tablets in 24 hours. 12/04/20   Drema Dallas, DO  topiramate (TOPAMAX) 25 MG tablet TAKE 1 TABLET(25 MG) BY MOUTH TWICE DAILY 05/26/22   Allegra Grana, FNP    Family History  Problem Relation Age of Onset   Heart disease Mother    Diabetes Mother     Anxiety disorder Mother    Arthritis Mother    Skin cancer Mother    Diabetes Father    Skin cancer Father    Arthritis Father    Colon polyps Father    Peptic Ulcer Disease Father    Colon polyps Brother    Heart disease Maternal Grandmother    Colon cancer Paternal Grandfather    Colon cancer Paternal Aunt    Thyroid cancer Neg Hx    Breast cancer Neg Hx      Social History   Tobacco Use   Smoking status: Former    Current packs/day: 0.00    Average packs/day: 1.5 packs/day for 8.0 years (12.0 ttl pk-yrs)    Types: Cigarettes    Start date: 09/16/2004    Quit date: 09/16/2012  Years since quitting: 11.0   Smokeless tobacco: Never  Vaping Use   Vaping status: Former  Substance Use Topics   Alcohol use: Not Currently    Comment: 2 glasswine/month   Drug use: No    Allergies as of 10/05/2023 - Review Complete 10/05/2023  Allergen Reaction Noted   Vyleesi [bremelanotide] Itching, Nausea And Vomiting, and Other (See Comments) 05/26/2020    Review of Systems:    All systems reviewed and negative except where noted in HPI.   Physical Exam:  BP 106/71   Pulse 70   Temp (!) 97.5 F (36.4 C)   Ht 5\' 5"  (1.651 m)   Wt 170 lb 12.8 oz (77.5 kg)   BMI 28.42 kg/m  No LMP recorded.  General:   Alert,  Well-developed, well-nourished, pleasant and cooperative in NAD Lungs:  Respirations even and unlabored.  Clear throughout to auscultation.   No wheezes, crackles, or rhonchi. No acute distress. Heart:  Regular rate and rhythm; no murmurs, clicks, rubs, or gallops. Abdomen:  Normal bowel sounds.  No bruits.  Soft, and non-distended without masses, hepatosplenomegaly or hernias noted.  No Tenderness.  No guarding or rebound tenderness.    Neurologic:  Alert and oriented x3;  grossly normal neurologically. Psych:  Alert and cooperative. Normal mood and affect.  Imaging Studies: NM Gastric Emptying Result Date: 09/22/2023 CLINICAL DATA:  Cyclic vomiting syndrome EXAM:  NUCLEAR MEDICINE GASTRIC EMPTYING SCAN TECHNIQUE: After oral ingestion of radiolabeled meal, sequential abdominal images were obtained for 4 hours. Percentage of activity emptying the stomach was calculated at 1 hour, 2 hour, 3 hour, and 4 hours. RADIOPHARMACEUTICALS:  2.65 mCi Tc-62m sulfur colloid in standardized meal COMPARISON:  CT August 31, 2022 FINDINGS: Expected location of the stomach in the left upper quadrant. Ingested meal empties the stomach gradually over the course of the study. 32% emptied at 1 hr ( normal >= 10%) 54% emptied at 2 hr ( normal >= 40%) 73% emptied at 3 hr ( normal >= 70%) 93% emptied at 4 hr ( normal >= 90%) IMPRESSION: Normal gastric emptying study. Electronically Signed   By: Maudry Mayhew M.D.   On: 09/22/2023 15:05    Assessment and Plan:   Haelee REDITH MACIA is a 37 y.o. y/o female has been referred for   IBS Flare: Most recently with Diarrhea; Occasional Constipation  Low FODMAP diet  Continue dicyclomine  Start align probiotic 1 capsule once daily  Consider Xifaxan if symptoms persist  GERD / Gastritis Continue Protonix 20 mg twice daily. Add Pepcid 20 Mg twice daily. Recommend Lifestyle Modifications to prevent Acid Reflux.  Rec. Avoid coffee, sodas, peppermint, garlic, onions, alcohol, citrus fruits, chocolate, tomatoes, fatty and spicey foods.  Avoid eating 2-3 hours before bedtime.    Upper abdominal Pain / Cramping  Continue dicyclomine 10 Mg 3 times daily.  Encouraged her to take 2 before bedtime daily.  Dicyclomine makes her a little drowsy.  Diarrhea Lab: Food allergy panel, alpha gal  Follow up in 4 weeks with TG.  Celso Amy, PA-C

## 2023-10-06 ENCOUNTER — Telehealth: Payer: Self-pay | Admitting: Obstetrics and Gynecology

## 2023-10-06 NOTE — Telephone Encounter (Signed)
I contacted the patient Andrea Roberson. No answer, left voicemail for rescheduled 10/19/23 due to Dr Valentino Saxon out of the office. I advised the patient we need to rescheduled.

## 2023-10-08 LAB — FOOD ALLERGY PROFILE
Allergen Corn, IgE: <0.1 kU/L
Clam IgE: <0.1 kU/L
Codfish IgE: <0.1 kU/L
Egg White IgE: <0.1 kU/L
Milk IgE: <0.1 kU/L
Peanut IgE: <0.1 kU/L
Scallop IgE: <0.1 kU/L
Sesame Seed IgE: <0.1 kU/L
Shrimp IgE: <0.1 kU/L
Soybean IgE: <0.1 kU/L
Walnut IgE: <0.1 kU/L
Wheat IgE: <0.1 kU/L

## 2023-10-08 LAB — ALPHA-GAL PANEL
Allergen Lamb IgE: <0.1 kU/L
Beef IgE: <0.1 kU/L
IgE (Immunoglobulin E), Serum: 314 [IU]/mL (ref 6–495)
O215-IgE Alpha-Gal: <0.1 kU/L
Pork IgE: <0.1 kU/L

## 2023-10-09 ENCOUNTER — Other Ambulatory Visit: Payer: Self-pay | Admitting: Family

## 2023-10-09 DIAGNOSIS — E669 Obesity, unspecified: Secondary | ICD-10-CM

## 2023-10-09 NOTE — Telephone Encounter (Signed)
Spoke with the patient and confirmed 1/30 at 1:15

## 2023-10-12 ENCOUNTER — Other Ambulatory Visit (HOSPITAL_COMMUNITY): Payer: Self-pay

## 2023-10-19 ENCOUNTER — Ambulatory Visit: Payer: BC Managed Care – PPO | Admitting: Obstetrics and Gynecology

## 2023-10-19 ENCOUNTER — Other Ambulatory Visit: Payer: Self-pay | Admitting: Obstetrics and Gynecology

## 2023-11-13 ENCOUNTER — Encounter: Payer: Self-pay | Admitting: Obstetrics and Gynecology

## 2023-11-16 ENCOUNTER — Other Ambulatory Visit (HOSPITAL_COMMUNITY)
Admission: RE | Admit: 2023-11-16 | Discharge: 2023-11-16 | Disposition: A | Discharge status: Home / Self Care | Payer: 59 | Source: Ambulatory Visit | Attending: Obstetrics and Gynecology | Admitting: Obstetrics and Gynecology

## 2023-11-16 ENCOUNTER — Ambulatory Visit (INDEPENDENT_AMBULATORY_CARE_PROVIDER_SITE_OTHER): Payer: 59 | Admitting: Obstetrics and Gynecology

## 2023-11-16 ENCOUNTER — Ambulatory Visit: Payer: 59 | Admitting: Physician Assistant

## 2023-11-16 ENCOUNTER — Encounter: Payer: Self-pay | Admitting: Physician Assistant

## 2023-11-16 VITALS — BP 101/68 | HR 80 | Temp 98.1°F | Ht 65.0 in | Wt 171.0 lb

## 2023-11-16 VITALS — BP 108/72 | HR 76 | Resp 16 | Ht 65.0 in | Wt 172.4 lb

## 2023-11-16 DIAGNOSIS — Z124 Encounter for screening for malignant neoplasm of cervix: Secondary | ICD-10-CM | POA: Insufficient documentation

## 2023-11-16 DIAGNOSIS — K219 Gastro-esophageal reflux disease without esophagitis: Secondary | ICD-10-CM | POA: Diagnosis not present

## 2023-11-16 DIAGNOSIS — E66811 Obesity, class 1: Secondary | ICD-10-CM

## 2023-11-16 DIAGNOSIS — Z01419 Encounter for gynecological examination (general) (routine) without abnormal findings: Secondary | ICD-10-CM | POA: Diagnosis present

## 2023-11-16 DIAGNOSIS — K2970 Gastritis, unspecified, without bleeding: Secondary | ICD-10-CM

## 2023-11-16 DIAGNOSIS — Z1159 Encounter for screening for other viral diseases: Secondary | ICD-10-CM

## 2023-11-16 DIAGNOSIS — E7800 Pure hypercholesterolemia, unspecified: Secondary | ICD-10-CM

## 2023-11-16 DIAGNOSIS — K649 Unspecified hemorrhoids: Secondary | ICD-10-CM | POA: Diagnosis not present

## 2023-11-16 DIAGNOSIS — Z131 Encounter for screening for diabetes mellitus: Secondary | ICD-10-CM

## 2023-11-16 DIAGNOSIS — N946 Dysmenorrhea, unspecified: Secondary | ICD-10-CM

## 2023-11-16 DIAGNOSIS — Z13220 Encounter for screening for lipoid disorders: Secondary | ICD-10-CM

## 2023-11-16 DIAGNOSIS — K582 Mixed irritable bowel syndrome: Secondary | ICD-10-CM | POA: Diagnosis not present

## 2023-11-16 DIAGNOSIS — Z87898 Personal history of other specified conditions: Secondary | ICD-10-CM

## 2023-11-16 DIAGNOSIS — E785 Hyperlipidemia, unspecified: Secondary | ICD-10-CM

## 2023-11-16 DIAGNOSIS — K5904 Chronic idiopathic constipation: Secondary | ICD-10-CM

## 2023-11-16 DIAGNOSIS — E663 Overweight: Secondary | ICD-10-CM

## 2023-11-16 MED ORDER — LUBIPROSTONE 8 MCG PO CAPS: 8.0000 ug | ORAL_CAPSULE | Freq: Two times a day (BID) | ORAL | 11 refills | Status: AC

## 2023-11-16 MED ORDER — HYDROCORTISONE (PERIANAL) 2.5 % EX CREA: 1.0000 | TOPICAL_CREAM | Freq: Two times a day (BID) | CUTANEOUS | 1 refills | Status: AC

## 2023-11-16 MED ORDER — ACETAMINOPHEN-CODEINE 300-30 MG PO TABS: 1.0000 | ORAL_TABLET | Freq: Four times a day (QID) | ORAL | 1 refills | Status: AC | PRN

## 2023-11-16 MED ORDER — NORETHINDRONE ACETATE 5 MG PO TABS: 5.0000 mg | ORAL_TABLET | Freq: Every day | ORAL | 3 refills | Status: DC

## 2023-11-16 NOTE — Progress Notes (Signed)
GYNECOLOGY ANNUAL PHYSICAL EXAM PROGRESS NOTE  Subjective:    Andrea Roberson is a 38 y.o. G51P2002 female who presents for an annual exam.  The patient is sexually active. The patient participates in regular exercise: no. Has the patient ever been transfused or tattooed?: yes. The patient reports that there is not domestic violence in her life.   The patient has the following complaints today: Continues to have complaints of painful periods since her tubal ligation in 2020. She was placed on Micronor last year as her periods were also extremely heavy and was experiencing PMS symptoms. Notes that they are not as heavy anymore and are typically manageable, lasting 5 or so days, however still are very painful.  Also notes that recently she has been experiencing breakthrough bleeding. Is taking Tylenol #3 as needed for severe pain, and regular Tylenol for milder pain. Can't utilize NSAIDs at this time as she is undergoing workup for some GI issues.    Menstrual History: Menarche age: 75 No LMP recorded. Period Duration (Days): 5-21 Period Pattern: (!) Irregular Menstrual Flow: Heavy, Moderate, Light Menstrual Control: Maxi pad, Tampon Menstrual Control Change Freq (Hours): 2 Dysmenorrhea: (!) Severe Dysmenorrhea Symptoms: Cramping, Nausea, Diarrhea, Headache     Gynecologic History:  Contraception: oral progesterone-only contraceptive History of STI's: Yes Last Pap: 04/08/2020. Results were: normal. Denies h/o abnormal pap smears. Last mammogram: Not age appropriate   OB History  Gravida Para Term Preterm AB Living  2 2 2  0 0 2  SAB IAB Ectopic Multiple Live Births  0 0 0 0 2    # Outcome Date GA Lbr Len/2nd Weight Sex Type Anes PTL Lv  2 Term 11/19/18 [redacted]w[redacted]d  7 lb 6.9 oz (3.37 kg) F CS-LTranv Spinal  LIV     Name: Andrea Roberson     Apgar1: 8  Apgar5: 8  1 Term 05/02/14 [redacted]w[redacted]d  7 lb 8 oz (3.402 kg) M CS-LTranv Spinal, EPI N LIV     Complications: Gestational diabetes  mellitus in childbirth, insulin controlled, Excessive weight gain in pregnancy     Name: Andrea Roberson     Apgar1: 8  Apgar5: 9    Past Medical History:  Diagnosis Date   Anxiety    Asthma    Bipolar disorder (HCC)    Depression    Frequent headaches    GERD (gastroesophageal reflux disease)    Gestational diabetes    History of gestational diabetes 2016   Hyperlipidemia    Migraines    Obesity (BMI 35.0-39.9 without comorbidity) 12/24/2015   Tarsal coalition of left foot    TMJ (dislocation of temporomandibular joint)    UTI (urinary tract infection)    Wears contact lenses     Past Surgical History:  Procedure Laterality Date   BREAST BIOPSY Left 03/03/2022   stereo bx of left breast asymmetry, ribbon marker, neg   BREAST BIOPSY Left 03/03/2022   [place] bx of left axilla LN, NO MARKER PLACED, neg   CESAREAN SECTION  2015   CESAREAN SECTION WITH BILATERAL TUBAL LIGATION N/A 11/19/2018   Procedure: CESAREAN SECTION WITH BILATERAL TUBAL LIGATION;  Surgeon: Hildred Laser, MD;  Location: [facility] ORS;  Service: Obstetrics;  Laterality: N/A;  TOB 1610 APGAR 8/8     COLONOSCOPY WITH PROPOFOL N/A 01/07/2022   Procedure: COLONOSCOPY WITH PROPOFOL;  Surgeon: Toney Reil, MD;  Location: [facility] END[facility]COPY;  Service: Gastroenterology;  Laterality: N/A;   ESOPHAGOGASTRODUODENOSCOPY (EGD) WITH PROPOFOL N/A 08/30/2017   Procedure: ESOPHAGOGASTRODUODENOSCOPY (  EGD) WITH PROPOFOL;  Surgeon: Toney Reil, MD;  Location: [facility];  Service: Endoscopy;  Laterality: N/A;   TUBAL LIGATION     WISDOM TOOTH EXTRACTION      Family History  Problem Relation Age of Onset   Heart disease Mother    Diabetes Mother    Anxiety disorder Mother    Arthritis Mother    Skin cancer Mother    Diabetes Father    Skin cancer Father    Arthritis Father    Colon polyps Father    Peptic Ulcer Disease Father    Colon polyps Brother    Heart disease Maternal Grandmother    Colon cancer Paternal  Grandfather    Colon cancer Paternal Aunt    Thyroid cancer Neg Hx    Breast cancer Neg Hx     Social History   Socioeconomic History   Marital status: Married    Spouse name: Not on file   Number of children: Not on file   Years of education: Not on file   Highest education level: Master's degree (e.g., MA, MS, MEng, MEd, MSW, MBA)  Occupational History   Not on file  Tobacco Use   Smoking status: Former    Current packs/day: 0.00    Average packs/day: 1.5 packs/day for 8.0 years (12.0 ttl pk-yrs)    Types: Cigarettes    Start date: 09/16/2004    Quit date: 09/16/2012    Years since quitting: 11.1   Smokeless tobacco: Never  Vaping Use   Vaping status: Former  Substance and Sexual Activity   Alcohol use: Not Currently    Comment: 2 glasswine/month   Drug use: No   Sexual activity: Yes    Birth control/protection: None, Surgical    Comment: tubal  Other Topics Concern   Not on file  Social History Narrative   Married   5 yo son , 5 month daughter   Runner, broadcasting/film/video- pre K   Right handed      Social Drivers of Health   Financial Resource Strain: Low Risk  (09/07/2023)   Overall Financial Resource Strain (CARDIA)    Difficulty of Paying Living Expenses: Not hard at all  Food Insecurity: No Food Insecurity (09/07/2023)   Hunger Vital Sign    Worried About Running Out of Food in the Last Year: Never true    Ran Out of Food in the Last Year: Never true  Transportation Needs: No Transportation Needs (09/07/2023)   PRAPARE - Administrator, Civil Service (Medical): No    Lack of Transportation (Non-Medical): No  Physical Activity: Insufficiently Active (09/07/2023)   Exercise Vital Sign    Days of Exercise per Week: 1 day    Minutes of Exercise per Session: 20 min  Stress: Stress Concern Present (09/07/2023)   [employer] of Occupational Health - Occupational Stress Questionnaire    Feeling of Stress : Rather much  Social Connections: Moderately  Integrated (09/07/2023)   Social Connection and Isolation Panel [NHANES]    Frequency of Communication with Friends and Family: Twice a week    Frequency of Social Gatherings with Friends and Family: Twice a week    Attends Religious Services: 1 to 4 times per year    Active Member of [employer] or Organizations: No    Attends Engineer, structural: Not on file    Marital Status: Married  Catering manager Violence: Not on file    Current Outpatient Medications on File Prior to  Visit  Medication Sig Dispense Refill   acetaminophen-codeine (TYLENOL #3) 300-30 MG tablet Take 1-2 tablets by mouth every 6 (six) hours as needed for moderate pain (pain score 4-6) or severe pain (pain score 7-10). 20 tablet 1   buPROPion (WELLBUTRIN XL) 150 MG 24 hr tablet Take 1 tablet (150 mg total) by mouth daily with breakfast. 90 tablet 3   cetirizine (ZYRTEC) 10 MG tablet Take 10 mg by mouth daily as needed for allergies.      dicyclomine (BENTYL) 10 MG capsule Take 1 capsule (10 mg total) by mouth 3 (three) times daily as needed for spasms. Use prn in anticipation of a stressor that triggers abdominal pain. 90 capsule 3   famotidine (PEPCID) 20 MG tablet Take 1 tablet (20 mg total) by mouth 2 (two) times daily. 180 tablet 3   FLUoxetine (PROZAC) 40 MG capsule Take 1 capsule (40 mg total) by mouth daily. 90 capsule 3   fluticasone (FLONASE) 50 MCG/ACT nasal spray Place 2 sprays into both nostrils daily as needed for allergies.      hydrOXYzine (ATARAX/VISTARIL) 25 MG tablet Take 0.5-1 tablets (12.5-25 mg total) by mouth 2 (two) times daily as needed. For anxiety, sleep 60 tablet 1   ipratropium (ATROVENT) 0.06 % nasal spray Place 2 sprays into both nostrils 4 (four) times daily. 15 mL 12   meloxicam (MOBIC) 15 MG tablet TAKE 1 TABLET(15 MG) BY MOUTH DAILY AS NEEDED FOR PAIN 30 tablet 1   metFORMIN (GLUCOPHAGE) 500 MG tablet TAKE 1 TABLET BY MOUTH TWICE DAILY 180 tablet 3   minoxidil (LONITEN) 2.5 MG tablet  Take 2.5 mg by mouth daily. Pt is taking 1/2 tablet 2 times daily     norethindrone (MICRONOR) 0.35 MG tablet TAKE 1 TABLET(0.35 MG) BY MOUTH DAILY 84 tablet 0   pantoprazole (PROTONIX) 20 MG tablet Take 2 tablets (40 mg total) by mouth daily. 180 tablet 3   propranolol ER (INDERAL LA) 60 MG 24 hr capsule TAKE 1 CAPSULE(60 MG) BY MOUTH DAILY 90 capsule 3   SUMAtriptan (IMITREX) 100 MG tablet Take 1 tablet earliest onset of migraine.  May repeat in 2 hours if headache persists or recurs.  Maximum 2 tablets in 24 hours. 10 tablet 5   topiramate (TOPAMAX) 25 MG tablet TAKE 1 TABLET(25 MG) BY MOUTH TWICE DAILY 120 tablet 2   Current Facility-Administered Medications on File Prior to Visit  Medication Dose Route Frequency Provider Last Rate Last Admin   betamethasone acetate-betamethasone sodium phosphate (CELESTONE) injection 3 mg  3 mg Intra-articular Once Felecia Shelling, DPM        Allergies  Allergen Reactions   Vyleesi [Bremelanotide] Itching, Nausea And Vomiting and Other (See Comments)    Flushing     Review of Systems Constitutional: negative for chills, fatigue, fevers and sweats Eyes: negative for irritation, redness and visual disturbance Ears, nose, mouth, throat, and face: negative for hearing loss, nasal congestion, snoring and tinnitus Respiratory: negative for asthma, cough, sputum Cardiovascular: negative for chest pain, dyspnea, exertional chest pressure/discomfort, irregular heart beat, palpitations and syncope Gastrointestinal: negative for abdominal pain, change in bowel habits, nausea and vomiting Genitourinary: positive for abnormal menstrual periods (see above HPI). Negative for genital lesions, sexual problems and vaginal discharge, dysuria and urinary incontinence Integument/breast: negative for breast lump, breast tenderness and nipple discharge Hematologic/lymphatic: negative for bleeding and easy bruising Musculoskeletal:negative for back pain and muscle  weakness Neurological: negative for dizziness, headaches, vertigo and weakness Endocrine: negative for diabetic  symptoms including polydipsia, polyuria and skin dryness Allergic/Immunologic: negative for hay fever and urticaria      Objective:  Blood pressure 108/72, pulse 76, resp. rate 16, height 5\' 5"  (1.651 m), weight 172 lb 6.4 oz (78.2 kg), last menstrual period 09/28/2023.  Body mass index is 28.69 kg/m.    General Appearance:    Alert, cooperative, no distress, appears stated age, overweight  Head:    Normocephalic, without obvious abnormality, atraumatic  Eyes:    PERRL, conjunctiva/corneas clear, EOM's intact, both eyes  Ears:    Normal external ear canals, both ears  Nose:   Nares normal, septum midline, mucosa normal, no drainage or sinus tenderness  Throat:   Lips, mucosa, and tongue normal; teeth and gums normal  Neck:   Supple, symmetrical, trachea midline, no adenopathy; thyroid: no enlargement/tenderness/nodules; no carotid bruit or JVD  Back:     Symmetric, no curvature, ROM normal, no CVA tenderness  Lungs:     Clear to auscultation bilaterally, respirations unlabored  Chest Wall:    No tenderness or deformity   Heart:    Regular rate and rhythm, S1 and S2 normal, no murmur, rub or gallop  Breast Exam:    No tenderness, masses, or nipple abnormality  Abdomen:     Soft, non-tender, bowel sounds active all four quadrants, no masses, no organomegaly.    Genitalia:    Pelvic:external genitalia normal, vagina without lesions, discharge, or tenderness, rectovaginal septum  normal. Cervix normal in appearance, no cervical motion tenderness, no adnexal masses or tenderness.  Uterus normal size, shape, mobile, regular contours, nontender.  Rectal:    Normal external sphincter.  No hemorrhoids appreciated. Internal exam not done.   Extremities:   Extremities normal, atraumatic, no cyanosis or edema  Pulses:   2+ and symmetric all extremities  Skin:   Skin color, texture, turgor  normal, no rashes or lesions  Lymph nodes:   Cervical, supraclavicular, and axillary nodes normal  Neurologic:   CNII-XII intact, normal strength, sensation and reflexes throughout   .  Labs:  Lab Results  Component Value Date   WBC 9.9 07/13/2023   HGB 11.9 (L) 07/13/2023   HCT 35.7 (L) 07/13/2023   MCV 88.4 07/13/2023   PLT 349.0 07/13/2023    Lab Results  Component Value Date   CREATININE 0.79 07/08/2023   BUN 21 (H) 07/08/2023   NA 134 (L) 07/08/2023   K 3.6 07/08/2023   CL 109 07/08/2023   CO2 16 (L) 07/08/2023    Lab Results  Component Value Date   ALT 32 07/08/2023   AST 15 07/08/2023   ALKPHOS 65 07/08/2023   BILITOT 0.7 07/08/2023    Lab Results  Component Value Date   TSH 1.670 04/08/2021    Lab Results  Component Value Date   CHOL 222 (H) 04/08/2021   HDL 33 (L) 04/08/2021   LDLCALC 101 (H) 04/08/2021   TRIG 517 (H) 04/08/2021   CHOLHDL 6.7 (H) 04/08/2021    Lab Results Value Date  Hemoglobin A1C 5.0 % 09/13/23     Assessment:   1. Well woman exam with routine gynecological exam   2. Dyslipidemia (high LDL; low HDL)   3. Overweight (BMI 25.0-29.9)   4. Encounter for hepatitis C screening test for low risk patient   5. Cervical cancer screening   6. Severe dysmenorrhea   7. History of prediabetes      Plan:  Follow up in 1 year for annual exam  1.  Well woman exam with routine gynecological exam - Blood tests: see orders. - Breast self exam technique reviewed and patient encouraged to perform self-exam monthly. - Contraception: oral progesterone-only contraceptive. Discussed changing to different form of hormonal control for her cycles as she continues to experience significant dysmenorrhea. Discussed changing from Micronor to Aygestin and to take continuously for menstrual suppression.  Also should consider possibility of endometriosis based on current symptoms (although no obvious implants seen in the past with prior surgeries). Patient  has tried several other contraceptive options in the past (IUD, OCPs, etc), but had to discontinue due to side effects/intolerance or being ineffective at managing her symptoms - Discussed healthy lifestyle modifications. Patient reports having to take a hiatus from exercising currently due to a minor back injury. Is undergoing physical therapy.  - Mammogram : to begin screens at age 33.   - Flu vaccine: up to date  2. Dyslipidemia Repeated cholesterol levels as likely improvement noted since patient has had significant weight loss and implemented lifestyle changes. Currently managed by PCP.   3. Overweight (BMI 25.0-29.9) - Reports over 100 lb weight loss over the past 1.5 years with use of GLP-1 and lifestyle modifications.  Congratulated patient on weight loss efforts.   4. Encounter for hepatitis C screening test for low risk patient  - Discussed need for 1 time screening for Hepatitis C in low risk patient. Patient ok to perform.   5. Cervical cancer screening - Pap smear ordered.  6. Severe dysmenorrhea - See above #1 for management of contraception. Will trial Aygestin for menstrual suppression. If still no rsoolve, can consider endometriosis as a possible diagnosis and trial Dewayne Hatch, or can consider surgical management with hysterectomy.   7. History of prediabetes - Improved with lifestyle modifications and use of GLP-1.  Now currently on Metformin for continued management.  Currently managed by PCP.    Follow up in 1 year for annual exam.    Hildred Laser, MD [facility]

## 2023-11-16 NOTE — Progress Notes (Signed)
Celso Amy, PA-C [address]  [address]  [place], [place] [address]  Main: [phone]  Fax: [phone]   Primary Care Physician: Allegra Grana, FNP  Primary Gastroenterologist:  Celso Amy, PA-C / Dr. Lannette Donath    CC: F/U IBS, GERD  HPI: Andrea Roberson is a 38 y.o. female returns for follow-up of irritable bowel syndrome and GERD.  Chronic lifelong GI symptoms including constipation, diarrhea, bloating, gas.  Tried and failed MiraLAX and Linzess.  She has a bad flareup of IBS every 3 months.  Admits to stress.  Works as a Runner, broadcasting/film/video.  Denies rectal bleeding, weight loss, or alarm symptoms.  Currently takes Protonix 20 mg twice daily, Pepcid 20 Mg twice daily, and dicyclomine 10 Mg 3 times daily as needed.  She tried align probiotic and low FODMAP diet.  On this treatment, her GI symptoms have greatly improved.  She has no more abdominal cramping or belching.  She still has some constipation and hemorrhoid symptoms.  GERD is controlled.  09/2023: Food allergy panel and alpha gal labs negative.  Fecal calprotectin was normal.   06/2023: Celiac panel labs negative.  Normal CBC.  Negative C. difficile and GI pathogen panel.     08/2022: CT abdomen pelvis with contrast: No acute abnormality.   12/2021 colonoscopy: Good prep, a few nonbleeding aphthous ulcers at the terminal ileum, otherwise normal colon.  Small Nonbleeding external hemorrhoids.  Terminal ileal biopsy consistent with acute enteritis.   11/2021 RUQ ultrasound normal.  No bowel signs.   08/2017 EGD: Mild gastritis, otherwise normal.  Biopsies negative for H. pylori.  Current Outpatient Medications  Medication Sig Dispense Refill   acetaminophen-codeine (TYLENOL #3) 300-30 MG tablet Take 1-2 tablets by mouth every 6 (six) hours as needed for moderate pain (pain score 4-6) or severe pain (pain score 7-10). 20 tablet 1   buPROPion (WELLBUTRIN XL) 150 MG 24 hr tablet Take 1 tablet (150 mg total) by mouth  daily with breakfast. 90 tablet 3   cetirizine (ZYRTEC) 10 MG tablet Take 10 mg by mouth daily as needed for allergies.      dicyclomine (BENTYL) 10 MG capsule Take 1 capsule (10 mg total) by mouth 3 (three) times daily as needed for spasms. Use prn in anticipation of a stressor that triggers abdominal pain. 90 capsule 3   famotidine (PEPCID) 20 MG tablet Take 1 tablet (20 mg total) by mouth 2 (two) times daily. 180 tablet 3   FLUoxetine (PROZAC) 40 MG capsule Take 1 capsule (40 mg total) by mouth daily. 90 capsule 3   fluticasone (FLONASE) 50 MCG/ACT nasal spray Place 2 sprays into both nostrils daily as needed for allergies.      gabapentin (NEURONTIN) 300 MG capsule Take 300 mg by mouth 3 (three) times daily.     hydrocortisone (ANUSOL-HC) 2.5 % rectal cream Place 1 Application rectally 2 (two) times daily. 30 g 1   hydrOXYzine (ATARAX/VISTARIL) 25 MG tablet Take 0.5-1 tablets (12.5-25 mg total) by mouth 2 (two) times daily as needed. For anxiety, sleep 60 tablet 1   ipratropium (ATROVENT) 0.06 % nasal spray Place 2 sprays into both nostrils 4 (four) times daily. 15 mL 12   lubiprostone (AMITIZA) 8 MCG capsule Take 1 capsule (8 mcg total) by mouth 2 (two) times daily with a meal. 60 capsule 11   meloxicam (MOBIC) 15 MG tablet TAKE 1 TABLET(15 MG) BY MOUTH DAILY AS NEEDED FOR PAIN 30 tablet 1   metFORMIN (  GLUCOPHAGE) 500 MG tablet TAKE 1 TABLET BY MOUTH TWICE DAILY 180 tablet 3   methocarbamol (ROBAXIN) 750 MG tablet Take 750 mg by mouth 4 (four) times daily.     minoxidil (LONITEN) 2.5 MG tablet Take 2.5 mg by mouth daily. Pt is taking 1/2 tablet 2 times daily     norethindrone (AYGESTIN) 5 MG tablet Take 1 tablet (5 mg total) by mouth daily. 90 tablet 3   pantoprazole (PROTONIX) 20 MG tablet Take 2 tablets (40 mg total) by mouth daily. 180 tablet 3   propranolol ER (INDERAL LA) 60 MG 24 hr capsule TAKE 1 CAPSULE(60 MG) BY MOUTH DAILY 90 capsule 3   SUMAtriptan (IMITREX) 100 MG tablet Take 1  tablet earliest onset of migraine.  May repeat in 2 hours if headache persists or recurs.  Maximum 2 tablets in 24 hours. 10 tablet 5   topiramate (TOPAMAX) 25 MG tablet TAKE 1 TABLET(25 MG) BY MOUTH TWICE DAILY 120 tablet 2   Current Facility-Administered Medications  Medication Dose Route Frequency Provider Last Rate Last Admin   betamethasone acetate-betamethasone sodium phosphate (CELESTONE) injection 3 mg  3 mg Intra-articular Once Felecia Shelling, DPM        Allergies as of 11/16/2023 - Review Complete 11/16/2023  Allergen Reaction Noted   Vyleesi [bremelanotide] Itching, Nausea And Vomiting, and Other (See Comments) 05/26/2020    Past Medical History:  Diagnosis Date   Anxiety    Asthma    Bipolar disorder (HCC)    Depression    Frequent headaches    GERD (gastroesophageal reflux disease)    Gestational diabetes    History of gestational diabetes 2016   Hyperlipidemia    Migraines    Obesity (BMI 35.0-39.9 without comorbidity) 12/24/2015   Tarsal coalition of left foot    TMJ (dislocation of temporomandibular joint)    UTI (urinary tract infection)    Wears contact lenses     Past Surgical History:  Procedure Laterality Date   BREAST BIOPSY Left 03/03/2022   stereo bx of left breast asymmetry, ribbon marker, neg   BREAST BIOPSY Left 03/03/2022   [place] bx of left axilla LN, NO MARKER PLACED, neg   CESAREAN SECTION  2015   CESAREAN SECTION WITH BILATERAL TUBAL LIGATION N/A 11/19/2018   Procedure: CESAREAN SECTION WITH BILATERAL TUBAL LIGATION;  Surgeon: Hildred Laser, MD;  Location: [facility] ORS;  Service: Obstetrics;  Laterality: N/A;  TOB 1610 APGAR 8/8     COLONOSCOPY WITH PROPOFOL N/A 01/07/2022   Procedure: COLONOSCOPY WITH PROPOFOL;  Surgeon: Toney Reil, MD;  Location: [facility];  Service: Gastroenterology;  Laterality: N/A;   ESOPHAGOGASTRODUODENOSCOPY (EGD) WITH PROPOFOL N/A 08/30/2017   Procedure: ESOPHAGOGASTRODUODENOSCOPY (EGD) WITH PROPOFOL;   Surgeon: Toney Reil, MD;  Location: [facility];  Service: Endoscopy;  Laterality: N/A;   TUBAL LIGATION     WISDOM TOOTH EXTRACTION      Review of Systems:    All systems reviewed and negative except where noted in HPI.   Physical Examination:   BP 101/68   Pulse 80   Temp 98.1 F (36.7 C)   Ht 5\' 5"  (1.651 m)   Wt 171 lb (77.6 kg)   BMI 28.46 kg/m   General: Well-nourished, well-developed in no acute distress.  Neuro: Alert and oriented x 3.  Grossly intact.  Psych: Alert and cooperative, normal mood and affect.   Imaging Studies: No results found.  Assessment and Plan:   Andrea Roberson is a  38 y.o. y/o female returns for follow-up of:  1.  Irritable bowel syndrome (diarrhea and constipation) -greatly improved.  Continue dicyclomine 10 Mg 3 times daily as needed.  Rx Amitiza 8 mcg 1 capsule twice daily, #60, 11 refills.  Continue low FODMAP diet and probiotics.  2.  GERD/gastritis -controlled on current treatment.  Continue pantoprazole 20 Mg twice daily.  Continue Pepcid 20 Mg twice daily.  Avoid GERD trigger foods.  3.  Hemorrhoids  Rx hydrocortisone cream 2.5% apply twice daily, 30 g, 1 refill.  Stressed importance of treating underlying constipation.  Follow-up if no improvement.    Celso Amy, PA-C  Follow up as needed if worsening or recurrent GI symptoms.

## 2023-11-16 NOTE — Patient Instructions (Signed)
Preventive Care 38-38 Years Old, Female Preventive care refers to lifestyle choices and visits with your health care provider that can promote health and wellness. Preventive care visits are also called wellness exams. What can I expect for my preventive care visit? Counseling During your preventive care visit, your health care provider may ask about your: Medical history, including: Past medical problems. Family medical history. Pregnancy history. Current health, including: Menstrual cycle. Method of birth control. Emotional well-being. Home life and relationship well-being. Sexual activity and sexual health. Lifestyle, including: Alcohol, nicotine or tobacco, and drug use. Access to firearms. Diet, exercise, and sleep habits. Work and work Astronomer. Sunscreen use. Safety issues such as seatbelt and bike helmet use. Physical exam Your health care provider may check your: Height and weight. These may be used to calculate your BMI (body mass index). BMI is a measurement that tells if you are at a healthy weight. Waist circumference. This measures the distance around your waistline. This measurement also tells if you are at a healthy weight and may help predict your risk of certain diseases, such as type 2 diabetes and high blood pressure. Heart rate and blood pressure. Body temperature. Skin for abnormal spots. What immunizations do I need?  Vaccines are usually given at various ages, according to a schedule. Your health care provider will recommend vaccines for you based on your age, medical history, and lifestyle or other factors, such as travel or where you work. What tests do I need? Screening Your health care provider may recommend screening tests for certain conditions. This may include: Pelvic exam and Pap test. Lipid and cholesterol levels. Diabetes screening. This is done by checking your blood sugar (glucose) after you have not eaten for a while (fasting). Hepatitis  B test. Hepatitis C test. HIV (human immunodeficiency virus) test. STI (sexually transmitted infection) testing, if you are at risk. BRCA-related cancer screening. This may be done if you have a family history of breast, ovarian, tubal, or peritoneal cancers. Talk with your health care provider about your test results, treatment options, and if necessary, the need for more tests. Follow these instructions at home: Eating and drinking  Eat a healthy diet that includes fresh fruits and vegetables, whole grains, lean protein, and low-fat dairy products. Take vitamin and mineral supplements as recommended by your health care provider. Do not drink alcohol if: Your health care provider tells you not to drink. You are pregnant, may be pregnant, or are planning to become pregnant. If you drink alcohol: Limit how much you have to 0-1 drink a day. Know how much alcohol is in your drink. In the [place] one drink equals one 12 oz bottle of beer (355 mL), one 5 oz glass of wine (148 mL), or one 1 oz glass of hard liquor (44 mL). Lifestyle Brush your teeth every morning and night with fluoride toothpaste. Floss one time each day. Exercise for at least 30 minutes 5 or more days each week. Do not use any products that contain nicotine or tobacco. These products include cigarettes, chewing tobacco, and vaping devices, such as e-cigarettes. If you need help quitting, ask your health care provider. Do not use drugs. If you are sexually active, practice safe sex. Use a condom or other form of protection to prevent STIs. If you do not wish to become pregnant, use a form of birth control. If you plan to become pregnant, see your health care provider for a prepregnancy visit. Find healthy ways to manage stress, such as: Meditation,  yoga, or listening to music. Journaling. Talking to a trusted person. Spending time with friends and family. Minimize exposure to UV radiation to reduce your risk of skin  cancer. Safety Always wear your seat belt while driving or riding in a vehicle. Do not drive: If you have been drinking alcohol. Do not ride with someone who has been drinking. If you have been using any mind-altering substances or drugs. While texting. When you are tired or distracted. Wear a helmet and other protective equipment during sports activities. If you have firearms in your house, make sure you follow all gun safety procedures. Seek help if you have been physically or sexually abused. What's next? Go to your health care provider once a year for an annual wellness visit. Ask your health care provider how often you should have your eyes and teeth checked. Stay up to date on all vaccines. This information is not intended to replace advice given to you by your health care provider. Make sure you discuss any questions you have with your health care provider. Document Revised: 03/31/2021 Document Reviewed: 03/31/2021 Elsevier Patient Education  2024 Elsevier Inc. Breast Self-Awareness Breast self-awareness is knowing how your breasts look and feel. You need to: Check your breasts on a regular basis. Tell your doctor about any changes. Become familiar with the look and feel of your breasts. This can help you catch a breast problem while it is still small and can be treated. You should do breast self-exams even if you have breast implants. What you need: A mirror. A well-lit room. A pillow or other soft object. How to do a breast self-exam Follow these steps to do a breast self-exam: Look for changes  Take off all the clothes above your waist. Stand in front of a mirror in a room with good lighting. Put your hands down at your sides. Compare your breasts in the mirror. Look for any difference between them, such as: A difference in shape. A difference in size. Wrinkles, dips, and bumps in one breast and not the other. Look at each breast for changes in the skin, such  as: Redness. Scaly areas. Skin that has gotten thicker. Dimpling. Open sores (ulcers). Look for changes in your nipples, such as: Fluid coming out of a nipple. Fluid around a nipple. Bleeding. Dimpling. Redness. A nipple that looks pushed in (retracted), or that has changed position. Feel for changes Lie on your back. Feel each breast. To do this: Pick a breast to feel. Place a pillow under the shoulder closest to that breast. Put the arm closest to that breast behind your head. Feel the nipple area of that breast using the hand of your other arm. Feel the area with the pads of your three middle fingers by making small circles with your fingers. Use light, medium, and firm pressure. Continue the overlapping circles, moving downward over the breast. Keep making circles with your fingers. Stop when you feel your ribs. Start making circles with your fingers again, this time going upward until you reach your collarbone. Then, make circles outward across your breast and into your armpit area. Squeeze your nipple. Check for discharge and lumps. Repeat these steps to check your other breast. Sit or stand in the tub or shower. With soapy water on your skin, feel each breast the same way you did when you were lying down. Write down what you find Writing down what you find can help you remember what to tell your doctor. Write down: What is  normal for each breast. Any changes you find in each breast. These include: The kind of changes you find. A tender or painful breast. Any lump you find. Write down its size and where it is. When you last had your monthly period (menstrual cycle). General tips If you are breastfeeding, the best time to check your breasts is after you feed your baby or after you use a breast pump. If you get monthly bleeding, the best time to check your breasts is 5-7 days after your monthly cycle ends. With time, you will become comfortable with the self-exam. You will  also start to know if there are changes in your breasts. Contact a doctor if: You see a change in the shape or size of your breasts or nipples. You see a change in the skin of your breast or nipples, such as red or scaly skin. You have fluid coming from your nipples that is not normal. You find a new lump or thick area. You have breast pain. You have any concerns about your breast health. Summary Breast self-awareness includes looking for changes in your breasts and feeling for changes within your breasts. You should do breast self-awareness in front of a mirror in a well-lit room. If you get monthly periods (menstrual cycles), the best time to check your breasts is 5-7 days after your period ends. Tell your doctor about any changes you see in your breasts. Changes include changes in size, changes on the skin, painful or tender breasts, or fluid from your nipples that is not normal. This information is not intended to replace advice given to you by your health care provider. Make sure you discuss any questions you have with your health care provider. Document Revised: 03/10/2022 Document Reviewed: 08/05/2021 Elsevier Patient Education  2024 [employer].

## 2023-11-17 ENCOUNTER — Encounter: Payer: Self-pay | Admitting: Obstetrics and Gynecology

## 2023-11-17 LAB — COMPREHENSIVE METABOLIC PANEL
ALT: 12 [IU]/L (ref 0–32)
AST: 17 [IU]/L (ref 0–40)
Albumin: 4.3 g/dL (ref 3.9–4.9)
Alkaline Phosphatase: 61 [IU]/L (ref 44–121)
BUN/Creatinine Ratio: 21 (ref 9–23)
BUN: 17 mg/dL (ref 6–20)
Bilirubin Total: 0.3 mg/dL (ref 0.0–1.2)
CO2: 19 mmol/L — ABNORMAL LOW (ref 20–29)
Calcium: 9.1 mg/dL (ref 8.7–10.2)
Chloride: 107 mmol/L — ABNORMAL HIGH (ref 96–106)
Creatinine, Ser: 0.82 mg/dL (ref 0.57–1.00)
Globulin, Total: 2.6 g/dL (ref 1.5–4.5)
Glucose: 79 mg/dL (ref 70–99)
Potassium: 4.1 mmol/L (ref 3.5–5.2)
Sodium: 139 mmol/L (ref 134–144)
Total Protein: 6.9 g/dL (ref 6.0–8.5)
eGFR: 94 mL/min/{1.73_m2} (ref >59)

## 2023-11-17 LAB — CBC
Hematocrit: 41 % (ref 34.0–46.6)
Hemoglobin: 13.2 g/dL (ref 11.1–15.9)
MCH: 28.8 pg (ref 26.6–33.0)
MCHC: 32.2 g/dL (ref 31.5–35.7)
MCV: 90 fL (ref 79–97)
Platelets: 325 10*3/uL (ref 150–450)
RBC: 4.58 x10E6/uL (ref 3.77–5.28)
RDW: 12.6 % (ref 11.7–15.4)
WBC: 7.4 10*3/uL (ref 3.4–10.8)

## 2023-11-17 LAB — LIPID PANEL
Chol/HDL Ratio: 5.1 {ratio} — ABNORMAL HIGH (ref 0.0–4.4)
Cholesterol, Total: 203 mg/dL — ABNORMAL HIGH (ref 100–199)
HDL: 40 mg/dL (ref >39)
LDL Chol Calc (NIH): 145 mg/dL — ABNORMAL HIGH (ref 0–99)
Triglycerides: 99 mg/dL (ref 0–149)
VLDL Cholesterol Cal: 18 mg/dL (ref 5–40)

## 2023-11-17 LAB — TSH: TSH: 0.874 u[IU]/mL (ref 0.450–4.500)

## 2023-11-17 LAB — HEMOGLOBIN A1C
Est. average glucose Bld gHb Est-mCnc: 103 mg/dL
Hgb A1c MFr Bld: 5.2 % (ref 4.8–5.6)

## 2023-11-17 LAB — HEPATITIS C ANTIBODY: Hep C Virus Ab: NONREACTIVE

## 2023-11-20 ENCOUNTER — Encounter: Payer: Self-pay | Admitting: Obstetrics and Gynecology

## 2023-11-22 LAB — CYTOLOGY - PAP
Comment: NEGATIVE
Diagnosis: NEGATIVE
High risk HPV: NEGATIVE

## 2023-11-23 ENCOUNTER — Encounter: Payer: Self-pay | Admitting: Obstetrics and Gynecology

## 2023-12-26 ENCOUNTER — Encounter: Payer: Self-pay | Admitting: Obstetrics and Gynecology

## 2024-01-05 ENCOUNTER — Encounter: Payer: Self-pay | Admitting: Family

## 2024-01-09 ENCOUNTER — Other Ambulatory Visit: Payer: Self-pay | Admitting: Family

## 2024-01-09 DIAGNOSIS — E66811 Obesity, class 1: Secondary | ICD-10-CM

## 2024-01-09 MED ORDER — METFORMIN HCL ER 500 MG PO TB24: 500.0000 mg | ORAL_TABLET | Freq: Every day | ORAL | 3 refills | Status: AC

## 2024-04-15 ENCOUNTER — Encounter: Payer: Self-pay | Admitting: Family

## 2024-05-06 ENCOUNTER — Other Ambulatory Visit: Payer: Self-pay | Admitting: Family

## 2024-05-06 DIAGNOSIS — E669 Obesity, unspecified: Secondary | ICD-10-CM

## 2024-08-06 ENCOUNTER — Encounter: Payer: Self-pay | Admitting: Family

## 2024-08-08 ENCOUNTER — Telehealth: Payer: Self-pay

## 2024-08-08 NOTE — Telephone Encounter (Signed)
 Patient scheduled an appointment on 08/12/2024, with Rollene Northern, FNP, with the following comment:  I'm established on some medications from [facility], but my gastroenterologist moved to [facility]. I wanted to see if Rollene could provide these medications for me since I am doing so well on them and my IBS has been managed by them.

## 2024-08-12 ENCOUNTER — Ambulatory Visit: Admitting: Family

## 2024-08-12 ENCOUNTER — Encounter: Payer: Self-pay | Admitting: Family

## 2024-08-12 VITALS — BP 128/80 | HR 78 | Temp 98.1°F | Ht 65.0 in | Wt 186.4 lb

## 2024-08-12 DIAGNOSIS — F411 Generalized anxiety disorder: Secondary | ICD-10-CM

## 2024-08-12 DIAGNOSIS — K582 Mixed irritable bowel syndrome: Secondary | ICD-10-CM

## 2024-08-12 DIAGNOSIS — Z01419 Encounter for gynecological examination (general) (routine) without abnormal findings: Secondary | ICD-10-CM

## 2024-08-12 DIAGNOSIS — E785 Hyperlipidemia, unspecified: Secondary | ICD-10-CM

## 2024-08-12 DIAGNOSIS — N920 Excessive and frequent menstruation with regular cycle: Secondary | ICD-10-CM | POA: Insufficient documentation

## 2024-08-12 DIAGNOSIS — G43109 Migraine with aura, not intractable, without status migrainosus: Secondary | ICD-10-CM

## 2024-08-12 DIAGNOSIS — E663 Overweight: Secondary | ICD-10-CM | POA: Diagnosis not present

## 2024-08-12 DIAGNOSIS — N924 Excessive bleeding in the premenopausal period: Secondary | ICD-10-CM | POA: Diagnosis not present

## 2024-08-12 DIAGNOSIS — F32A Depression, unspecified: Secondary | ICD-10-CM

## 2024-08-12 DIAGNOSIS — F419 Anxiety disorder, unspecified: Secondary | ICD-10-CM

## 2024-08-12 DIAGNOSIS — R198 Other specified symptoms and signs involving the digestive system and abdomen: Secondary | ICD-10-CM

## 2024-08-12 MED ORDER — FLUOXETINE HCL 40 MG PO CAPS: 40.0000 mg | ORAL_CAPSULE | Freq: Every day | ORAL | 3 refills | Status: AC

## 2024-08-12 MED ORDER — DICYCLOMINE HCL 10 MG PO CAPS: 10.0000 mg | ORAL_CAPSULE | Freq: Three times a day (TID) | ORAL | 3 refills | Status: AC | PRN

## 2024-08-12 MED ORDER — BUPROPION HCL ER (XL) 150 MG PO TB24: 150.0000 mg | ORAL_TABLET | Freq: Every day | ORAL | 3 refills | Status: AC

## 2024-08-12 MED ORDER — NORETHINDRONE ACETATE 5 MG PO TABS: 5.0000 mg | ORAL_TABLET | Freq: Every day | ORAL | 0 refills | Status: DC

## 2024-08-12 MED ORDER — BUSPIRONE HCL 5 MG PO TABS: 5.0000 mg | ORAL_TABLET | Freq: Three times a day (TID) | ORAL | 2 refills | Status: AC | PRN

## 2024-08-12 NOTE — Patient Instructions (Signed)
 Resume buspar  as discussed  I will let you know once I hear from GYN

## 2024-08-12 NOTE — Assessment & Plan Note (Signed)
 Chronic, stable.  Continue propranolol  60 mg ER, Imitrex  100 mg as needed,  topamax  25mg  every day.

## 2024-08-12 NOTE — Progress Notes (Signed)
 Assessment & Plan:  Excessive bleeding in premenopausal period Assessment & Plan: Chronic, stable.  Aygestin  started by Dr Connell 11/16/23; note to Dr Starla in regards to how long to continue and if transitioning to combined OCP appropriate.    Orders: -     Norethindrone  Acetate; Take 1 tablet (5 mg total) by mouth daily.  Dispense: 90 tablet; Refill: 0  Well woman exam with routine gynecological exam  Overweight (BMI 25.0-29.9)  Dyslipidemia (high LDL; low HDL)  Generalized anxiety disorder -     buPROPion  HCl ER (XL); Take 1 tablet (150 mg total) by mouth daily with breakfast.  Dispense: 90 tablet; Refill: 3 -     FLUoxetine  HCl; Take 1 capsule (40 mg total) by mouth daily.  Dispense: 90 capsule; Refill: 3 -     busPIRone  HCl; Take 1 tablet (5 mg total) by mouth 3 (three) times daily as needed.  Dispense: 90 tablet; Refill: 2  Irritable bowel syndrome with both constipation and diarrhea -     Dicyclomine  HCl; Take 1 capsule (10 mg total) by mouth 3 (three) times daily as needed for spasms. Use prn in anticipation of a stressor that triggers abdominal pain.  Dispense: 90 capsule; Refill: 3  Alternating constipation and diarrhea Assessment & Plan: Chronic, stable.  Refilled bentyl  for prn use.   Orders: -     Dicyclomine  HCl; Take 1 capsule (10 mg total) by mouth 3 (three) times daily as needed for spasms. Use prn in anticipation of a stressor that triggers abdominal pain.  Dispense: 90 capsule; Refill: 3  Anxiety and depression Assessment & Plan: Chronic, suboptimal control. Continue prozac  40mg , wellbutrin  150mg ; resume buspar  5mg  TID PRN and titrate    Migraine with aura and without status migrainosus, not intractable Assessment & Plan: Chronic, stable.  Continue propranolol  60 mg ER, Imitrex  100 mg as needed,  topamax  25mg  every day.       Return precautions given.   Risks, benefits, and alternatives of the medications and treatment plan prescribed today were  discussed, and patient expressed understanding.   Education regarding symptom management and diagnosis given to patient on AVS either electronically or printed.  Return for Complete Physical Exam.  Rollene Northern, FNP  Subjective:    Patient ID: Andrea Roberson, female    DOB: 15-Jun-1986, 38 y.o.   MRN: [id number]  CC: Andrea Roberson is a 38 y.o. female who presents today for follow up.   HPI: HPI Discussed the use of AI scribe software for clinical note transcription with the patient, who gave verbal consent to proceed.  History of Present Illness   Andrea Roberson is a 38 year old female who presents for medication management and follow-up for heavy menstrual bleeding.  Since starting norethindrone , her heavy menstrual bleeding has completely stopped. She takes this medication daily and has experienced no spotting or bleeding since 11/2023.  She manages irritable bowel syndrome with Bentyl  (dicyclomine ), which she takes twice a day as needed, typically in the morning and before dinner. It helps with spasms and pain. She requests refill.   Compliant with Prozac  40 mg daily and Wellbutrin  150 mg daily. She feels the medication is adequate but notes that life events have caused fluctuations in her mood.  She continues to see a therapist bi-weekly. She has experienced significant anxiety, particularly following the sudden loss of her brother in mid-2025. She previously tried hydroxyzine  for anxiety but found it too sedating. She used to take buspar  prescribed  by Dr Coby.   Follow up Dr Connell 11/16/23 for severe dysmenorrhea  Trial aygestin  for menstrual suppression Pap smear 11/16/23 NILM, neg HPV CT a/p 08/2022 Reproductive: Tampon in the vagina. Uterus unremarkable. No adnexal mass No CT evidence for acute intra-abdominal or pelvic abnormality. Negative for acute appendicitis.  Colonoscopy Dr unk 12/2021 No longer on amitiza .    Allergies: Vyleesi  [bremelanotide] Current Outpatient Medications on File Prior to Visit  Medication Sig Dispense Refill   acetaminophen -codeine  (TYLENOL  #3) 300-30 MG tablet Take 1-2 tablets by mouth every 6 (six) hours as needed for moderate pain (pain score 4-6) or severe pain (pain score 7-10). 20 tablet 1   cetirizine (ZYRTEC) 10 MG tablet Take 10 mg by mouth daily as needed for allergies.      famotidine  (PEPCID ) 20 MG tablet Take 1 tablet (20 mg total) by mouth 2 (two) times daily. 180 tablet 3   fluticasone (FLONASE) 50 MCG/ACT nasal spray Place 2 sprays into both nostrils daily as needed for allergies.      gabapentin  (NEURONTIN ) 300 MG capsule Take 300 mg by mouth daily as needed.     hydrocortisone  (ANUSOL -HC) 2.5 % rectal cream Place 1 Application rectally 2 (two) times daily. 30 g 1   ipratropium (ATROVENT ) 0.06 % nasal spray Place 2 sprays into both nostrils 4 (four) times daily. 15 mL 12   lubiprostone  (AMITIZA ) 8 MCG capsule Take 1 capsule (8 mcg total) by mouth 2 (two) times daily with a meal. 60 capsule 11   metFORMIN  (GLUCOPHAGE -XR) 500 MG 24 hr tablet Take 1 tablet (500 mg total) by mouth daily. 90 tablet 3   methocarbamol (ROBAXIN) 750 MG tablet Take 750 mg by mouth 4 (four) times daily as needed for muscle spasms.     minoxidil (LONITEN) 2.5 MG tablet Take 2.5 mg by mouth daily. Pt is taking 1/2 tablet 2 times daily     pantoprazole  (PROTONIX ) 20 MG tablet Take 2 tablets (40 mg total) by mouth daily. 180 tablet 3   propranolol  ER (INDERAL  LA) 60 MG 24 hr capsule TAKE 1 CAPSULE(60 MG) BY MOUTH DAILY 90 capsule 3   SUMAtriptan  (IMITREX ) 100 MG tablet Take 1 tablet earliest onset of migraine.  May repeat in 2 hours if headache persists or recurs.  Maximum 2 tablets in 24 hours. 10 tablet 5   topiramate  (TOPAMAX ) 25 MG tablet TAKE 1 TABLET(25 MG) BY MOUTH TWICE DAILY 180 tablet 3   Current Facility-Administered Medications on File Prior to Visit  Medication Dose Route Frequency Provider Last Rate  Last Admin   betamethasone  acetate-betamethasone  sodium phosphate (CELESTONE ) injection 3 mg  3 mg Intra-articular Once Evans, Brent M, DPM        Review of Systems  Constitutional:  Negative for chills and fever.  Respiratory:  Negative for cough.   Cardiovascular:  Negative for chest pain and palpitations.  Gastrointestinal:  Negative for nausea and vomiting.  Genitourinary:  Negative for menstrual problem and vaginal bleeding.  Psychiatric/Behavioral:  The patient is nervous/anxious.       Objective:    BP 128/80   Pulse 78   Temp 98.1 F (36.7 C) (Oral)   Ht 5' 5 (1.651 m)   Wt 186 lb 6.4 oz (84.6 kg)   SpO2 98%   BMI 31.02 kg/m  BP Readings from Last 3 Encounters:  08/12/24 128/80  11/16/23 101/68  11/16/23 108/72   Wt Readings from Last 3 Encounters:  08/12/24 186 lb 6.4 oz (84.6 kg)  11/16/23 171 lb (77.6 kg)  11/16/23 172 lb 6.4 oz (78.2 kg)      08/12/2024    8:11 AM 11/16/2023    1:34 PM 09/07/2023    4:11 PM  Depression screen PHQ 2/9  Decreased Interest 0 0 1  Down, Depressed, Hopeless 0 0 2  PHQ - 2 Score 0 0 3  Altered sleeping   2  Tired, decreased energy   1  Change in appetite   0  Feeling bad or failure about yourself    1  Trouble concentrating   0  Moving slowly or fidgety/restless   1  Suicidal thoughts   0  PHQ-9 Score   8  Difficult doing work/chores   Somewhat difficult      09/07/2023    4:11 PM 07/13/2023    4:02 PM 01/30/2023    3:49 PM 01/26/2022    3:41 PM  GAD 7 : Generalized Anxiety Score  Nervous, Anxious, on Edge 2 2 3  0  Control/stop worrying 2 2 2  0  Worry too much - different things 2 2 2  0  Trouble relaxing 1 2 1  0  Restless 1 1 0 0  Easily annoyed or irritable 2 2 1  0  Afraid - awful might happen 1 2 1  0  Total GAD 7 Score 11 13 10  0  Anxiety Difficulty Somewhat difficult Somewhat difficult Not difficult at all Not difficult at all      Physical Exam Vitals reviewed.  Constitutional:      Appearance: She is  well-developed.  Eyes:     Conjunctiva/sclera: Conjunctivae normal.  Cardiovascular:     Rate and Rhythm: Normal rate and regular rhythm.     Pulses: Normal pulses.     Heart sounds: Normal heart sounds.  Pulmonary:     Effort: Pulmonary effort is normal.     Breath sounds: Normal breath sounds. No wheezing, rhonchi or rales.  Skin:    General: Skin is warm and dry.  Neurological:     Mental Status: She is alert.  Psychiatric:        Speech: Speech normal.        Behavior: Behavior normal.        Thought Content: Thought content normal.

## 2024-08-12 NOTE — Assessment & Plan Note (Addendum)
 Chronic, stable.  Aygestin  started by Dr Connell 11/16/23; note to Dr Starla in regards to how long to continue and if transitioning to combined OCP appropriate.

## 2024-08-12 NOTE — Assessment & Plan Note (Signed)
 Chronic, suboptimal control. Continue prozac  40mg , wellbutrin  150mg ; resume buspar  5mg  TID PRN and titrate

## 2024-08-12 NOTE — Assessment & Plan Note (Signed)
 Chronic, stable.  Refilled bentyl  for prn use.

## 2024-08-12 NOTE — Telephone Encounter (Signed)
 Dr Starla,  Im seeing Andrea Roberson , pt of Dr Shaune today, for f/u menstrual suppression  She does done very well without any bleeding on Aygestin  5mg  since 10/2023  Can you advise on if I can move her to a combine oral contraceptive? I wasn't sure how long she would need to be on aygestin .   She plans to make a follow up with your practice if you would prefer to have staff reach out to her to discuss.

## 2024-08-29 ENCOUNTER — Ambulatory Visit: Admitting: Family

## 2024-09-03 ENCOUNTER — Encounter: Payer: Self-pay | Admitting: Podiatry

## 2024-09-03 ENCOUNTER — Ambulatory Visit: Admitting: Podiatry

## 2024-09-03 VITALS — Ht 65.0 in | Wt 186.4 lb

## 2024-09-03 DIAGNOSIS — M19072 Primary osteoarthritis, left ankle and foot: Secondary | ICD-10-CM | POA: Diagnosis not present

## 2024-09-03 MED ORDER — BETAMETHASONE SOD PHOS & ACET 6 (3-3) MG/ML IJ SUSP
3.0000 mg | Freq: Once | INTRAMUSCULAR | Status: AC
  Administered 2024-09-03: 3 mg via INTRA_ARTICULAR

## 2024-09-03 NOTE — Progress Notes (Signed)
 Chief Complaint  Patient presents with   Injections    Pt is here to receive injection into the left ankle for pain.    HPI: Patient presenting for follow-up evaluation of tarsal coalition and left foot and ankle pain.  Patient was last seen in the office 01/20/2023.  She comes in occasionally for cortisone injections to the sinus tarsi which seem to help for several months.  She continues to take take meloxicam  daily.  She also wears her orthotics daily.  She continues to state that she does not want any surgical intervention and would like to continue conservative care.    Past Medical History:  Diagnosis Date   Anxiety    Asthma    Bipolar disorder (HCC)    Depression    Frequent headaches    GERD (gastroesophageal reflux disease)    Gestational diabetes    History of gestational diabetes 2016   Hyperlipidemia    Migraines    Obesity (BMI 35.0-39.9 without comorbidity) 12/24/2015   Tarsal coalition of left foot    TMJ (dislocation of temporomandibular joint)    UTI (urinary tract infection)    Wears contact lenses    Past Surgical History:  Procedure Laterality Date   BREAST BIOPSY Left 03/03/2022   stereo bx of left breast asymmetry, ribbon marker, neg   BREAST BIOPSY Left 03/03/2022   us  bx of left axilla LN, NO MARKER PLACED, neg   CESAREAN SECTION  2015   CESAREAN SECTION WITH BILATERAL TUBAL LIGATION N/A 11/19/2018   Procedure: CESAREAN SECTION WITH BILATERAL TUBAL LIGATION;  Surgeon: Connell Davies, MD;  Location: [facility] ORS;  Service: Obstetrics;  Laterality: N/A;  TOB 9177 APGAR 8/8     COLONOSCOPY WITH PROPOFOL  N/A 01/07/2022   Procedure: COLONOSCOPY WITH PROPOFOL ;  Surgeon: Unk Corinn Skiff, MD;  Location: [facility] END[facility]COPY;  Service: Gastroenterology;  Laterality: N/A;   ESOPHAGOGASTRODUODENOSCOPY (EGD) WITH PROPOFOL  N/A 08/30/2017   Procedure: ESOPHAGOGASTRODUODENOSCOPY (EGD) WITH PROPOFOL ;  Surgeon: Unk Corinn Skiff, MD;  Location: [facility];   Service: Endoscopy;  Laterality: N/A;   TUBAL LIGATION     WISDOM TOOTH EXTRACTION     Allergies  Allergen Reactions   Vyleesi [Bremelanotide] Itching, Nausea And Vomiting and Other (See Comments)    Flushing    Physical Exam: General: The patient is alert and oriented x3 in no acute distress.  Dermatology: Skin is warm, dry and supple bilateral lower extremities. Negative for open lesions or macerations.  Vascular: Palpable pedal pulses bilaterally. No edema or erythema noted. Capillary refill within normal limits.  Neurological: Grossly intact via light touch  Musculoskeletal Exam: Unchanged.  Limited ROM with inversion and eversion of the subtalar joint. Muscle strength 5/5 in all groups bilateral.  There is some pain with inversion and eversion as well as pain on palpation of the sinus tarsi left.  CT FOOT LEFT WO CONTRAST 05/07/2019 IMPRESSION: 1. 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  -Injection of 0.5 mLs Celestone  Soluspan injected into the left sinus tarsi.  -Patient seems to get better relief with the meloxicam  50 mg daily.  Continue as needed.  Refills provided.  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 48yr old son.   Thresa EMERSON Sar, DPM [facility]  Dr. Thresa EMERSON Sar,  DPM    [address] [place], [place] [address]                Office [phone]  Fax [phone]

## 2024-11-21 ENCOUNTER — Ambulatory Visit: Admitting: Obstetrics and Gynecology

## 2024-11-21 ENCOUNTER — Encounter: Payer: Self-pay | Admitting: Obstetrics and Gynecology

## 2024-11-21 VITALS — BP 110/72 | HR 71 | Ht 65.0 in | Wt 192.0 lb

## 2024-11-21 DIAGNOSIS — Z803 Family history of malignant neoplasm of breast: Secondary | ICD-10-CM

## 2024-11-21 DIAGNOSIS — Z01419 Encounter for gynecological examination (general) (routine) without abnormal findings: Secondary | ICD-10-CM

## 2024-11-21 DIAGNOSIS — N941 Unspecified dyspareunia: Secondary | ICD-10-CM

## 2024-11-21 DIAGNOSIS — Z3041 Encounter for surveillance of contraceptive pills: Secondary | ICD-10-CM

## 2024-11-21 DIAGNOSIS — N946 Dysmenorrhea, unspecified: Secondary | ICD-10-CM

## 2024-11-21 MED ORDER — NORETHINDRONE ACETATE 5 MG PO TABS: 5.0000 mg | ORAL_TABLET | Freq: Every day | ORAL | 3 refills | Status: AC

## 2024-11-21 NOTE — Patient Instructions (Signed)
 I value your feedback and you entrusting [place] with your care. If you get a [place] patient survey, I would appreciate you taking the time to let us know about your experience today. Thank you! ? ? ?
